# Patient Record
Sex: Female | Born: 1935 | Race: Black or African American | Hispanic: No | Marital: Single | Smoking: Former smoker
Health system: Southern US, Community
[De-identification: ages and names within clinical notes are randomized; demographics above are authoritative.]

## PROBLEM LIST (undated history)

## (undated) DIAGNOSIS — F329 Major depressive disorder, single episode, unspecified: Secondary | ICD-10-CM

## (undated) DIAGNOSIS — Z993 Dependence on wheelchair: Secondary | ICD-10-CM

## (undated) DIAGNOSIS — M810 Age-related osteoporosis without current pathological fracture: Secondary | ICD-10-CM

## (undated) DIAGNOSIS — F209 Schizophrenia, unspecified: Secondary | ICD-10-CM

## (undated) DIAGNOSIS — I639 Cerebral infarction, unspecified: Secondary | ICD-10-CM

## (undated) DIAGNOSIS — N189 Chronic kidney disease, unspecified: Secondary | ICD-10-CM

## (undated) DIAGNOSIS — D649 Anemia, unspecified: Secondary | ICD-10-CM

## (undated) DIAGNOSIS — J449 Chronic obstructive pulmonary disease, unspecified: Secondary | ICD-10-CM

## (undated) DIAGNOSIS — E119 Type 2 diabetes mellitus without complications: Secondary | ICD-10-CM

## (undated) DIAGNOSIS — M4807 Spinal stenosis, lumbosacral region: Secondary | ICD-10-CM

## (undated) DIAGNOSIS — K219 Gastro-esophageal reflux disease without esophagitis: Secondary | ICD-10-CM

## (undated) DIAGNOSIS — I509 Heart failure, unspecified: Secondary | ICD-10-CM

## (undated) DIAGNOSIS — F0390 Unspecified dementia without behavioral disturbance: Secondary | ICD-10-CM

## (undated) DIAGNOSIS — E785 Hyperlipidemia, unspecified: Secondary | ICD-10-CM

## (undated) DIAGNOSIS — F32A Depression, unspecified: Secondary | ICD-10-CM

## (undated) DIAGNOSIS — J019 Acute sinusitis, unspecified: Secondary | ICD-10-CM

## (undated) DIAGNOSIS — Z9981 Dependence on supplemental oxygen: Secondary | ICD-10-CM

## (undated) DIAGNOSIS — H409 Unspecified glaucoma: Secondary | ICD-10-CM

## (undated) DIAGNOSIS — I10 Essential (primary) hypertension: Secondary | ICD-10-CM

## (undated) DIAGNOSIS — M1990 Unspecified osteoarthritis, unspecified site: Secondary | ICD-10-CM

## (undated) DIAGNOSIS — C50919 Malignant neoplasm of unspecified site of unspecified female breast: Secondary | ICD-10-CM

## (undated) DIAGNOSIS — Z978 Presence of other specified devices: Secondary | ICD-10-CM

## (undated) DIAGNOSIS — M47812 Spondylosis without myelopathy or radiculopathy, cervical region: Secondary | ICD-10-CM

## (undated) DIAGNOSIS — Z960 Presence of urogenital implants: Secondary | ICD-10-CM

## (undated) HISTORY — DX: Unspecified osteoarthritis, unspecified site: M19.90

## (undated) HISTORY — DX: Cerebral infarction, unspecified: I63.9

## (undated) HISTORY — DX: Malignant neoplasm of unspecified site of unspecified female breast: C50.919

## (undated) HISTORY — PX: APPENDECTOMY: SHX54

## (undated) HISTORY — PX: UTERINE SUSPENSION: SUR1430

## (undated) HISTORY — PX: CHOLECYSTECTOMY: SHX55

## (undated) HISTORY — PX: BREAST SURGERY: SHX581

## (undated) HISTORY — DX: Schizophrenia, unspecified: F20.9

## (undated) HISTORY — DX: Chronic obstructive pulmonary disease, unspecified: J44.9

## (undated) HISTORY — DX: Anemia, unspecified: D64.9

## (undated) HISTORY — DX: Acute sinusitis, unspecified: J01.90

## (undated) HISTORY — PX: CATARACT EXTRACTION, BILATERAL: SHX1313

## (undated) HISTORY — DX: Depression, unspecified: F32.A

## (undated) HISTORY — DX: Unspecified dementia, unspecified severity, without behavioral disturbance, psychotic disturbance, mood disturbance, and anxiety: F03.90

## (undated) HISTORY — PX: JOINT REPLACEMENT: SHX530

## (undated) HISTORY — DX: Type 2 diabetes mellitus without complications: E11.9

## (undated) HISTORY — DX: Major depressive disorder, single episode, unspecified: F32.9

---

## 1998-03-13 ENCOUNTER — Ambulatory Visit (HOSPITAL_COMMUNITY): Admission: RE | Admit: 1998-03-13 | Discharge: 1998-03-13 | Payer: Self-pay | Admitting: Geriatric Medicine

## 1999-12-26 ENCOUNTER — Encounter: Admission: RE | Admit: 1999-12-26 | Discharge: 1999-12-26 | Payer: Self-pay | Admitting: *Deleted

## 1999-12-26 ENCOUNTER — Encounter: Payer: Self-pay | Admitting: *Deleted

## 2000-03-28 ENCOUNTER — Emergency Department (HOSPITAL_COMMUNITY): Admission: EM | Admit: 2000-03-28 | Discharge: 2000-03-28 | Payer: Self-pay

## 2000-12-17 ENCOUNTER — Encounter: Payer: Self-pay | Admitting: *Deleted

## 2000-12-17 ENCOUNTER — Encounter: Admission: RE | Admit: 2000-12-17 | Discharge: 2000-12-17 | Payer: Self-pay | Admitting: Family Medicine

## 2001-05-23 ENCOUNTER — Inpatient Hospital Stay (HOSPITAL_COMMUNITY): Admission: EM | Admit: 2001-05-23 | Discharge: 2001-05-28 | Payer: Self-pay | Admitting: Emergency Medicine

## 2001-05-23 ENCOUNTER — Emergency Department (HOSPITAL_COMMUNITY): Admission: EM | Admit: 2001-05-23 | Discharge: 2001-05-23 | Payer: Self-pay | Admitting: Emergency Medicine

## 2001-05-23 ENCOUNTER — Encounter: Payer: Self-pay | Admitting: Internal Medicine

## 2001-05-23 ENCOUNTER — Encounter: Payer: Self-pay | Admitting: Emergency Medicine

## 2001-05-24 ENCOUNTER — Encounter: Payer: Self-pay | Admitting: Geriatric Medicine

## 2002-11-20 ENCOUNTER — Encounter: Payer: Self-pay | Admitting: Emergency Medicine

## 2002-11-20 ENCOUNTER — Emergency Department (HOSPITAL_COMMUNITY): Admission: EM | Admit: 2002-11-20 | Discharge: 2002-11-20 | Payer: Self-pay | Admitting: Emergency Medicine

## 2003-03-16 ENCOUNTER — Encounter: Admission: RE | Admit: 2003-03-16 | Discharge: 2003-03-16 | Payer: Self-pay | Admitting: Geriatric Medicine

## 2003-03-16 ENCOUNTER — Encounter: Payer: Self-pay | Admitting: Geriatric Medicine

## 2004-06-27 ENCOUNTER — Other Ambulatory Visit: Admission: RE | Admit: 2004-06-27 | Discharge: 2004-06-27 | Payer: Self-pay | Admitting: Geriatric Medicine

## 2004-07-04 ENCOUNTER — Encounter: Admission: RE | Admit: 2004-07-04 | Discharge: 2004-07-04 | Payer: Self-pay | Admitting: Geriatric Medicine

## 2005-07-24 ENCOUNTER — Encounter: Admission: RE | Admit: 2005-07-24 | Discharge: 2005-07-24 | Payer: Self-pay | Admitting: Geriatric Medicine

## 2005-10-11 ENCOUNTER — Inpatient Hospital Stay (HOSPITAL_COMMUNITY): Admission: EM | Admit: 2005-10-11 | Discharge: 2005-10-15 | Payer: Self-pay | Admitting: Emergency Medicine

## 2005-10-14 ENCOUNTER — Encounter (INDEPENDENT_AMBULATORY_CARE_PROVIDER_SITE_OTHER): Payer: Self-pay | Admitting: *Deleted

## 2005-10-15 ENCOUNTER — Inpatient Hospital Stay (HOSPITAL_COMMUNITY)
Admission: RE | Admit: 2005-10-15 | Discharge: 2005-10-25 | Payer: Self-pay | Admitting: Physical Medicine & Rehabilitation

## 2005-10-15 ENCOUNTER — Ambulatory Visit: Payer: Self-pay | Admitting: Physical Medicine & Rehabilitation

## 2005-11-13 ENCOUNTER — Encounter: Admission: RE | Admit: 2005-11-13 | Discharge: 2005-11-13 | Payer: Self-pay | Admitting: Geriatric Medicine

## 2005-11-14 ENCOUNTER — Ambulatory Visit (HOSPITAL_COMMUNITY): Admission: RE | Admit: 2005-11-14 | Discharge: 2005-11-14 | Payer: Self-pay | Admitting: Interventional Cardiology

## 2005-12-04 ENCOUNTER — Encounter: Admission: RE | Admit: 2005-12-04 | Discharge: 2005-12-04 | Payer: Self-pay | Admitting: Geriatric Medicine

## 2005-12-12 ENCOUNTER — Inpatient Hospital Stay (HOSPITAL_COMMUNITY): Admission: RE | Admit: 2005-12-12 | Discharge: 2005-12-17 | Payer: Self-pay | Admitting: Orthopedic Surgery

## 2005-12-12 ENCOUNTER — Ambulatory Visit: Payer: Self-pay | Admitting: Physical Medicine & Rehabilitation

## 2005-12-17 ENCOUNTER — Ambulatory Visit: Payer: Self-pay | Admitting: Physical Medicine & Rehabilitation

## 2005-12-17 ENCOUNTER — Inpatient Hospital Stay (HOSPITAL_COMMUNITY)
Admission: RE | Admit: 2005-12-17 | Discharge: 2005-12-26 | Payer: Self-pay | Admitting: Physical Medicine & Rehabilitation

## 2006-09-24 ENCOUNTER — Encounter: Admission: RE | Admit: 2006-09-24 | Discharge: 2006-09-24 | Payer: Self-pay | Admitting: Geriatric Medicine

## 2007-01-30 ENCOUNTER — Ambulatory Visit (HOSPITAL_COMMUNITY): Admission: RE | Admit: 2007-01-30 | Discharge: 2007-01-30 | Payer: Self-pay | Admitting: Internal Medicine

## 2007-04-15 ENCOUNTER — Encounter: Admission: RE | Admit: 2007-04-15 | Discharge: 2007-04-15 | Payer: Self-pay | Admitting: Internal Medicine

## 2007-09-06 ENCOUNTER — Emergency Department (HOSPITAL_COMMUNITY): Admission: EM | Admit: 2007-09-06 | Discharge: 2007-09-06 | Payer: Self-pay | Admitting: Emergency Medicine

## 2007-11-04 ENCOUNTER — Encounter: Admission: RE | Admit: 2007-11-04 | Discharge: 2007-11-04 | Payer: Self-pay | Admitting: Geriatric Medicine

## 2008-10-23 ENCOUNTER — Encounter: Admission: RE | Admit: 2008-10-23 | Discharge: 2008-10-23 | Payer: Self-pay | Admitting: Orthopedic Surgery

## 2009-07-05 ENCOUNTER — Encounter: Admission: RE | Admit: 2009-07-05 | Discharge: 2009-07-05 | Payer: Self-pay | Admitting: Geriatric Medicine

## 2010-01-14 ENCOUNTER — Emergency Department (HOSPITAL_COMMUNITY): Admission: EM | Admit: 2010-01-14 | Discharge: 2010-01-14 | Payer: Self-pay | Admitting: Emergency Medicine

## 2010-10-23 ENCOUNTER — Emergency Department (HOSPITAL_COMMUNITY): Payer: Medicare Other

## 2010-10-23 ENCOUNTER — Inpatient Hospital Stay (HOSPITAL_COMMUNITY)
Admission: EM | Admit: 2010-10-23 | Discharge: 2010-10-26 | DRG: 690 | Disposition: A | Discharge status: Skilled Nursing Facility | Payer: Medicare Other | Attending: Family Medicine | Admitting: Family Medicine

## 2010-10-23 DIAGNOSIS — R32 Unspecified urinary incontinence: Secondary | ICD-10-CM | POA: Diagnosis present

## 2010-10-23 DIAGNOSIS — F329 Major depressive disorder, single episode, unspecified: Secondary | ICD-10-CM | POA: Diagnosis present

## 2010-10-23 DIAGNOSIS — I69959 Hemiplegia and hemiparesis following unspecified cerebrovascular disease affecting unspecified side: Secondary | ICD-10-CM

## 2010-10-23 DIAGNOSIS — A498 Other bacterial infections of unspecified site: Secondary | ICD-10-CM | POA: Diagnosis present

## 2010-10-23 DIAGNOSIS — R5381 Other malaise: Secondary | ICD-10-CM | POA: Diagnosis present

## 2010-10-23 DIAGNOSIS — J4489 Other specified chronic obstructive pulmonary disease: Secondary | ICD-10-CM | POA: Diagnosis present

## 2010-10-23 DIAGNOSIS — X58XXXA Exposure to other specified factors, initial encounter: Secondary | ICD-10-CM | POA: Diagnosis present

## 2010-10-23 DIAGNOSIS — F209 Schizophrenia, unspecified: Secondary | ICD-10-CM | POA: Diagnosis present

## 2010-10-23 DIAGNOSIS — S46819A Strain of other muscles, fascia and tendons at shoulder and upper arm level, unspecified arm, initial encounter: Secondary | ICD-10-CM | POA: Diagnosis present

## 2010-10-23 DIAGNOSIS — J449 Chronic obstructive pulmonary disease, unspecified: Secondary | ICD-10-CM | POA: Diagnosis present

## 2010-10-23 DIAGNOSIS — M899 Disorder of bone, unspecified: Secondary | ICD-10-CM | POA: Diagnosis present

## 2010-10-23 DIAGNOSIS — F068 Other specified mental disorders due to known physiological condition: Secondary | ICD-10-CM | POA: Diagnosis present

## 2010-10-23 DIAGNOSIS — M25519 Pain in unspecified shoulder: Secondary | ICD-10-CM | POA: Diagnosis present

## 2010-10-23 DIAGNOSIS — M1990 Unspecified osteoarthritis, unspecified site: Secondary | ICD-10-CM | POA: Diagnosis present

## 2010-10-23 DIAGNOSIS — Z66 Do not resuscitate: Secondary | ICD-10-CM | POA: Diagnosis present

## 2010-10-23 DIAGNOSIS — C50919 Malignant neoplasm of unspecified site of unspecified female breast: Secondary | ICD-10-CM | POA: Diagnosis present

## 2010-10-23 DIAGNOSIS — M47812 Spondylosis without myelopathy or radiculopathy, cervical region: Secondary | ICD-10-CM | POA: Diagnosis present

## 2010-10-23 DIAGNOSIS — N390 Urinary tract infection, site not specified: Principal | ICD-10-CM | POA: Diagnosis present

## 2010-10-23 DIAGNOSIS — M542 Cervicalgia: Secondary | ICD-10-CM | POA: Diagnosis present

## 2010-10-23 DIAGNOSIS — E785 Hyperlipidemia, unspecified: Secondary | ICD-10-CM | POA: Diagnosis present

## 2010-10-23 DIAGNOSIS — F3289 Other specified depressive episodes: Secondary | ICD-10-CM | POA: Diagnosis present

## 2010-10-23 LAB — BASIC METABOLIC PANEL
BUN: 10 mg/dL (ref 6–23)
CO2: 26 mEq/L (ref 19–32)
GFR calc non Af Amer: 56 mL/min — ABNORMAL LOW (ref >60)
Glucose, Bld: 133 mg/dL — ABNORMAL HIGH (ref 70–99)
Potassium: 4 mEq/L (ref 3.5–5.1)

## 2010-10-23 LAB — CBC
HCT: 41.2 % (ref 36.0–46.0)
Hemoglobin: 13.3 g/dL (ref 12.0–15.0)
MCH: 29.2 pg (ref 26.0–34.0)
MCHC: 32.3 g/dL (ref 30.0–36.0)
MCV: 90.5 fL (ref 78.0–100.0)
Platelets: 183 K/uL (ref 150–400)
RBC: 4.55 MIL/uL (ref 3.87–5.11)
RDW: 15 % (ref 11.5–15.5)
WBC: 6.7 K/uL (ref 4.0–10.5)

## 2010-10-23 LAB — BASIC METABOLIC PANEL WITH GFR
Calcium: 10.2 mg/dL (ref 8.4–10.5)
Chloride: 105 meq/L (ref 96–112)
Creatinine, Ser: 0.96 mg/dL (ref 0.4–1.2)
GFR calc Af Amer: >60 mL/min (ref >60)
Sodium: 139 meq/L (ref 135–145)

## 2010-10-23 LAB — URINALYSIS, ROUTINE W REFLEX MICROSCOPIC
Bilirubin Urine: NEGATIVE
Ketones, ur: NEGATIVE mg/dL
Nitrite: NEGATIVE
Protein, ur: NEGATIVE mg/dL
Specific Gravity, Urine: 1.006 (ref 1.005–1.030)
Urine Glucose, Fasting: NEGATIVE mg/dL
Urobilinogen, UA: 0.2 mg/dL (ref 0.0–1.0)
pH: 6 (ref 5.0–8.0)

## 2010-10-23 LAB — DIFFERENTIAL
Basophils Absolute: 0 10*3/uL (ref 0.0–0.1)
Basophils Relative: 0 % (ref 0–1)
Eosinophils Absolute: 0 10*3/uL (ref 0.0–0.7)
Eosinophils Relative: 0 % (ref 0–5)
Lymphocytes Relative: 21 % (ref 12–46)
Lymphs Abs: 1.4 10*3/uL (ref 0.7–4.0)
Monocytes Absolute: 0.5 10*3/uL (ref 0.1–1.0)
Monocytes Relative: 7 % (ref 3–12)
Neutro Abs: 4.8 K/uL (ref 1.7–7.7)
Neutrophils Relative %: 71 % (ref 43–77)

## 2010-10-23 LAB — GLUCOSE, CAPILLARY: Glucose-Capillary: 121 mg/dL — ABNORMAL HIGH (ref 70–99)

## 2010-10-23 LAB — URINE MICROSCOPIC-ADD ON

## 2010-10-23 LAB — POCT CARDIAC MARKERS
CKMB, poc: 2.6 ng/mL (ref 1.0–8.0)
Myoglobin, poc: 118 ng/mL (ref 12–200)
Troponin i, poc: <0.05 ng/mL (ref 0.00–0.09)

## 2010-10-23 LAB — MRSA PCR SCREENING: MRSA by PCR: NEGATIVE

## 2010-10-24 ENCOUNTER — Other Ambulatory Visit (HOSPITAL_COMMUNITY): Payer: Medicare Other

## 2010-10-24 ENCOUNTER — Inpatient Hospital Stay (HOSPITAL_COMMUNITY): Payer: Medicare Other

## 2010-10-24 LAB — GLUCOSE, CAPILLARY
Glucose-Capillary: 134 mg/dL — ABNORMAL HIGH (ref 70–99)
Glucose-Capillary: 141 mg/dL — ABNORMAL HIGH (ref 70–99)
Glucose-Capillary: 117 mg/dL — ABNORMAL HIGH (ref 70–99)

## 2010-10-24 LAB — CBC
HCT: 40 % (ref 36.0–46.0)
Hemoglobin: 12.8 g/dL (ref 12.0–15.0)
MCH: 28.9 pg (ref 26.0–34.0)
MCHC: 32 g/dL (ref 30.0–36.0)
MCV: 90.3 fL (ref 78.0–100.0)
Platelets: 162 K/uL (ref 150–400)
RBC: 4.43 MIL/uL (ref 3.87–5.11)
RDW: 15 % (ref 11.5–15.5)
WBC: 6.4 K/uL (ref 4.0–10.5)

## 2010-10-24 LAB — HEMOGLOBIN A1C: Mean Plasma Glucose: 126 mg/dL — ABNORMAL HIGH (ref <117)

## 2010-10-24 LAB — BASIC METABOLIC PANEL WITH GFR
BUN: 9 mg/dL (ref 6–23)
CO2: 28 meq/L (ref 19–32)
Chloride: 105 meq/L (ref 96–112)
Glucose, Bld: 104 mg/dL — ABNORMAL HIGH (ref 70–99)
Potassium: 4 meq/L (ref 3.5–5.1)

## 2010-10-24 LAB — BASIC METABOLIC PANEL
Calcium: 9.7 mg/dL (ref 8.4–10.5)
Creatinine, Ser: 0.9 mg/dL (ref 0.4–1.2)
GFR calc Af Amer: >60 mL/min (ref >60)
GFR calc non Af Amer: 60 mL/min — ABNORMAL LOW (ref >60)
Sodium: 141 mEq/L (ref 135–145)

## 2010-10-25 ENCOUNTER — Other Ambulatory Visit (HOSPITAL_COMMUNITY): Payer: Medicare Other

## 2010-10-25 LAB — URINE CULTURE
Colony Count: >=100000
Culture  Setup Time: 201202072100

## 2010-10-25 LAB — CBC
MCH: 29.1 pg (ref 26.0–34.0)
MCHC: 32.4 g/dL (ref 30.0–36.0)
Platelets: 163 10*3/uL (ref 150–400)
RBC: 4.36 MIL/uL (ref 3.87–5.11)

## 2010-10-25 LAB — GLUCOSE, CAPILLARY: Glucose-Capillary: 104 mg/dL — ABNORMAL HIGH (ref 70–99)

## 2010-10-26 LAB — GLUCOSE, CAPILLARY
Glucose-Capillary: 130 mg/dL — ABNORMAL HIGH (ref 70–99)
Glucose-Capillary: 110 mg/dL — ABNORMAL HIGH (ref 70–99)

## 2010-10-29 LAB — GLUCOSE, CAPILLARY: Glucose-Capillary: 165 mg/dL — ABNORMAL HIGH (ref 70–99)

## 2010-11-02 NOTE — Discharge Summary (Signed)
  NAMEKATERIN, NEGRETE                   ACCOUNT NO.:  [id number]  MEDICAL RECORD NO.:  [id number]           PATIENT TYPE:  I  LOCATION:  1538                         FACILITY:  [facility] [place]  PHYSICIAN:  Pleas Koch, MD        DATE OF BIRTH:  05-12-1926  DATE OF ADMISSION:  10/23/2010 DATE OF DISCHARGE:                              DISCHARGE SUMMARY   ADDENDUM  The patient will be discharged home on indwelling Foley.  Please note that this has not been a longstanding Foley and that she has had this for a short period of time.  The patient had a trial of voiding but failed this on day prior to discharge, and this may likely help her until she is able to ambulate well with physical and occupational therapy.  Please re-assess once at nursing home for ability to void on her own and you can clamp this as an outpatient.          ______________________________ Pleas Koch, MD     JS/MEDQ  D:  10/26/2010  T:  10/26/2010  Job:  [id number]  Electronically Signed by Pleas Koch MD on 11/02/2010 03:05:07 PM

## 2010-11-02 NOTE — Discharge Summary (Signed)
[name], [name]                   ACCOUNT NO.:  [id number]  MEDICAL RECORD NO.:  [id number]           PATIENT TYPE:  I  LOCATION:  1538                         FACILITY:  [facility]  PHYSICIAN:  Pleas Koch, MD        DATE OF BIRTH:  31-Jul-1926  DATE OF ADMISSION:  10/23/2010 DATE OF DISCHARGE:                              DISCHARGE SUMMARY   DISCHARGE DIAGNOSES: 1. Escherichia coli urosepsis. 2. Right-sided neck pain. 3. Prior history of stroke. 4. Hyperlipidemia. 5. Breast cancer. 6. Schizophrenia. 7. Dementia. 8. Depression. 9. Anemia.  MEDICATIONS:  Medications will be reconciled on discharge.  PERTINENT RADIOLOGICAL FINDINGS:  C-spine x-rays on May 24, 2011, showed: 1. No acute findings. 2. Advanced degenerative cervical spondylosis and mild     dextroscoliosis, multilevel bilateral neural foraminal stenosis,     left side greater than right, and osteopenia.  Shoulder x-ray showed: 1. No acute findings. 2. Moderate acromioclavicular and glenohumeral degenerative joint     disease. 3. Osteopenia.  MRI of the right shoulder done on 05/25/2011 showed: 1. Large full thickness retracted supraspinatus tendon tear. 2. Probable inflammatory arthropathy involving right shoulder. 3. Degenerative labral changes and possible superior labral tear.  MRI of the C-spine showed: Stenosis at C4, C5, C6 level with cord compression, altered signal intensity within the cord, which may represent myelomalacia or edema, marked bilateral foraminal narrowing at both levels and questioned 1.4 cm right lobe thyroid lesion.  The rest of the cervical spine has mild- to-moderate stenosis and some foraminal narrowing.  HISTORY:  The patient is an 77 year old female with history of stroke and end-stage osteoarthritis, who awoke on the morning of admission with inability to turn head to right secondary to severe pain.  This was not relieved with Vicodin.  The daughter complained she has had  increased weakness and decreased function and was able to walk up the steps with assistance and across the floor; however, was not able to transfer from the bed to wheelchair without difficulty.  The patient fell in the shower in the spring and was caught by her right arm and since that time has had intermittent pain.  She has pain running down the right side of her neck to her elbow and the pain  is burning in nature and unrelenting.  The patient has had some increasing incontinence of urine, and as she has hemiplegia secondary to a stroke, and mainly is dependent on her right hand and goes up and down from the bathroom by assisting with the right hand and now is limited because of this.  Please see admission dictation for further details.  HOSPITAL COURSE: 1. UTI.  The patient was noted to have many bacteria in the urine and     subsequently was placed on Rocephin, which she will likely     continue.  She was placed on nitrofurantoin and on ceftriaxone     initially, and urine culture came back showing above 100,000 CFU of     E. coli, sensitive to Bactrim, tobramycin, nitrofurantoin,     levofloxacin, gentamicin, ciprofloxacin, ceftriaxone and cefazolin;  and resistant to ampicillin.  As such, I will discharge the patient     home on Macrobid b.i.d.  The patient will need probably about 3-4     more days of the same. 2. Shoulder complex pain.  Orthopedics was consulted.  Dr. Madelon Lips had     seen the patient and he noted decreased range of motion to the     left, greater than right, and decreased extension with good     flexion.  MRI did show a significant tear of the supraspinatus as     noted above as well as probable superior labral tear.  It was     recommended that the patient follow-up as an outpatient for further     therapy within 2-3 weeks.  The patient underwent steroid injection     in the left shoulder complex and received much improvement with 40     mg of  Depo-Medrol and 5% Marcaine, and if the patient does have     recurring right shoulder pain, Dr. Madelon Lips can be consulted.  The     beeper number for him is [phone]. 3. Prior history of CVA.  The patient was stable within     hospitalization and stayed on aspirin 325 mg. 4. The patient was kept on sliding scale coverage while in hospital     and blood sugars were relatively well controlled while here. 5. Dementia.  The patient was on donepezil and will continue the same.     It is noted that she is at [facility];     however, physical therapy evaluated the patient and thought that     she would likely be a good candidate for staff to rehabilitate her     and get her strength back, and we concur with this. 6. History of breast cancer.  This likely will need to be followed up     with Oncology as an outpatient. 7. Anemia, stable. 8. Blood pressure moderately well controlled while in hospital.     Continue on medications prior to admission.  Medications will be reconciled on discharge.  Vitals on day of review were temperature 97.9, pulse 106, respirations 20, systolic blood pressure 131-143/75-76, and she is saturating 94% on room air.          ______________________________ Pleas Koch, MD     JS/MEDQ  D:  10/25/2010  T:  10/25/2010  Job:  [id number]  cc:   Hal T. Stoneking, M.D. Fax: [phone]  Dr. Raechel Ache, M.D. Fax: [phone]  Electronically Signed by Pleas Koch MD on 11/02/2010 03:05:05 PM

## 2010-11-02 NOTE — Discharge Summary (Signed)
  [name], [name]                   ACCOUNT NO.:  [id number]  MEDICAL RECORD NO.:  [id number]           PATIENT TYPE:  I  LOCATION:  1538                         FACILITY:  [facility]  PHYSICIAN:  Pleas Koch, MD        DATE OF BIRTH:  1926-01-11  DATE OF ADMISSION:  10/23/2010 DATE OF DISCHARGE:                              DISCHARGE SUMMARY   ADDENDUM: On day of discharge, vital signs were temperature 98.5, pulse 86, respirations 18, blood pressure 130-137/75-82 and 94% room air.  The patient was still having some shoulder pain and stated that she has not really had a stool since she came in.  She denied any fever, any nausea, any vomiting.  The patient will be discharged today to nursing facility.  She will likely need to continue on 4 more days of nitrofurantoin b.i.d. and follow up as an outpatient with Dr. Madelon Lips of Orthopedics.  The patient will need physical therapy, occupational therapy at skilled nursing facility as well.  MEDICATIONS ON DISCHARGE: 1. Tylenol Extra Strength 500 mg 1 tablet b.i.d. 2. Restasis 0.05% 1 drop b.i.d., both eyes. 3. Patanol 0.1%, 1 drop b.i.d., both eyes. 4. Gabapentin 100 mg 1 capsule q.h.s. 5. Doxepin 1 capsule q.h.s. 6. Clotrimazole and betamethasone 1%/0.05% cream topically 1     application daily p.r.n. 7. Fluphenazine decanoate 25 mg 0.3 mL q. monthly IM. 8. Metformin 500 mg 1 tablet q.a.m. 9. Hydrocodone APAP 5/325, 0.5-1 tablet q. 6 h. p.r.n. for pain and a     prescription was given for this. 10.Aspirin enteric coated 325, 1 tablet q.a.m. 11.Donepezil 10 mg 1 tablet q.h.s. 12.Melatonin 3 mg 1 tablet q.h.s. 13.Advair Diskus 100/50, 1 puff b.i.d. 14.Albuterol 0.83 mg/mL nebulization 1 neb q.a.m., and she apparently     takes this nebulization 30 minutes before exercise.  I have discontinued Prilosec as this has been shown to cause pneumonia in select patient populations including the elderly.  The patient will also go home on  nitrofurantoin 100 mg 1 tablet b.i.d. until all medications are gone.  I have also discharged her on low-dose lactulose 15 mL b.i.d. until stool/diarrhea and this can be discontinued at discretion of nursing home physician.          ______________________________ Pleas Koch, MD     JS/MEDQ  D:  10/26/2010  T:  10/26/2010  Job:  [id number]  Electronically Signed by Pleas Koch MD on 11/02/2010 03:05:10 PM

## 2010-12-04 LAB — URINALYSIS, ROUTINE W REFLEX MICROSCOPIC
Bilirubin Urine: NEGATIVE
Ketones, ur: NEGATIVE mg/dL
Nitrite: NEGATIVE
Protein, ur: NEGATIVE mg/dL
Urobilinogen, UA: 0.2 mg/dL (ref 0.0–1.0)

## 2010-12-04 NOTE — H&P (Signed)
[name], [name]                   ACCOUNT NO.:  [id number]  MEDICAL RECORD NO.:  [id number]           PATIENT TYPE:  E  LOCATION:  WLED                         FACILITY:  [facility]  PHYSICIAN:  Homero Fellers, MD   DATE OF BIRTH:  [birth year]  DATE OF ADMISSION:  10/23/2010 DATE OF DISCHARGE:                             HISTORY & PHYSICAL   PRIMARY CARE PHYSICIAN:  Hal T. Stoneking, MD  ORTHOPEDIC SURGEON:  Dr. Eulah Pont of [facility].  CHIEF COMPLAINT:  Right neck and shoulder pain.  HISTORY OF PRESENT ILLNESS:  This is an 77 year old female with a history of stroke and end-stage osteoarthritis who awoke this morning, unable to turn her head to the right secondary to severe pain.  The pain was not relieved with Vicodin.  Further her daughter complains that the patient has had increased weakness and decreased function.  Just a few months ago, she was able to walk up the steps with assistance and across the floor of the house, now she is unable to even transfer from bed to wheelchair without great difficulty.  Per the daughter, the patient fell in the shower in the spring and was caught by her right arm, since that time she has had intermittent pain.  Today, the patient describes the pain is traveling from the right side of her neck down to approximately her elbow, the pain is burning in nature and is unrelenting.  PAST MEDICAL HISTORY: 1. Diabetes mellitus. 2. COPD. 3. Stroke with residual left hemiparesis. 4. Hyperlipidemia. 5. Breast cancer. 6. Schizophrenia. 7. Anemia. 8. Dementia. 9. Depression. 10.Syncope.  PAST SURGICAL HISTORY:  A left total knee replacement. Appendectomy. Uterine suspension.  REVIEW OF SYSTEMS:  Per HPI, otherwise CONSTITUTIONAL:  The patient denies any fever, night sweats, insomnia or anorexia.  HEAD:  She denies any headache, changes in her vision, pain in her ears, eyes, nose, or throat.  CHEST:  She denies any chest pain,  palpitations, orthopnea or PND.  RESPIRATORY:  She denies any cough, hemoptysis, or difficulty breathing.  GASTROINTESTINAL:  She denies any vomiting, changes to her bowel habits or abdominal pain.  GENITOURINARY:  She denies any hematuria or dysuria, but she does complain of acute onset of incontinence this morning, which is atypical for this patient. EXTREMITIES:  She denies any swelling in all 4 extremities; however, in her right upper extremity, she has acute onset of burning pain as described in the HPI.  SKIN:  She denies any rashes, bruises, or lesions.  NEUROLOGIC:  She denies any syncope or seizures.  PSYCHIATRIC: She denies any depression or suicidal ideations.  SOCIAL HISTORY:  She lives in an assisted living facility.  She is a do not resuscitate.  She denies any tobacco, alcohol, or drugs.  Her daughter appears to be her primary caretaker.  FAMILY HISTORY:  Positive for coronary artery disease and stroke.  PHYSICAL EXAMINATION:  GENERAL:  This is a pleasant, morbidly obese Caucasian female lying in no apparent distress in the [facility] ED. VITAL SIGNS:  Temperature 97.8, pulse 95, respirations 22, blood pressure 132/67. HEENT:  Head is  atraumatic, normocephalic.  Eyes are anicteric with pupils that are pinpoint at this point in time.  Nose shows no nasal discharge or exterior lesions.  Mouth has moist mucous membranes.  She has got both upper and lower dentures. NECK:  Supple with midline trachea.  No JVD.  No lymphadenopathy.  I do a do not auscultate any carotid bruits. CHEST:  Demonstrates no accessory muscle use.  She does not have any wheezes or crackles to my auscultation. HEART:  Regular rate and rhythm without obvious murmurs, rubs, or gallops. ABDOMEN: Obese, nontender, nondistended with good bowel sounds.  I do not appreciate any masses. EXTREMITIES:  Lower extremities show no clubbing, cyanosis, or edema. She has 5/5 strength in each right upper extremity.   She has difficulty with abduction and flexion.  She has no problem with extension or adduction of her right shoulder.  Rapid finger movements on the right are reduced.  She is unable to do them on the left secondary to her residual hemiparesis. SKIN:  No rashes, bruises, or lesions. NEUROLOGIC:  Cranial nerves II through XII are grossly intact.  The patient has mild asymmetry of her face from her previous stroke.  Her speech is slightly slurred.  Her tongue deviates to the right.  All of this is chronic in nature. PSYCHIATRIC:  Patient is alert and oriented.  Her demeanor is pleasant, cooperative.  Her grooming is good.  LABORATORY DATA:  White count 6.7, hemoglobin 13.3, hematocrit 41.2, platelets 183.  Sodium 139, potassium 4.0, chloride 105, bicarb 26, BUN 10, creatinine 0.96, glucose 133.  Point care cardiac markers x1 are negative.  Urine shows large leukocyte esterase and 21 to 50 white blood cells.  RADIOLOGICAL EXAMS: 1. She has an MRI of her cervical and thoracic spine and right     shoulder pending.  X-ray of her right shoulder shows no acute     findings. 2. Moderate acromioclavicular and glenohumeral DJD. 3. Osteopenia. 4. She also had an x-ray of her cervical C-spine, no acute findings. 5. Advanced degenerative cervical spondylosis and mild     dextroscoliosis, multilevel bilateral neural foraminal stenosis     left side greater than right. 6. Osteopenia.  ASSESSMENT:  Dr. Homero Fellers has seen and examined the patient, collected history, reviewed her chart and spoken at length with the patient, her daughter, and the PA about case. His impression is 1. This is an 77 year old female with increased weakness, who presents     to the [facility] ED for right-sided neck and shoulder pain. 2. She has urinary tract infection with incontinence. 3. She has increased weakness and debilitation.  PLAN: 1. We will admit her.  With regard to urinary tract infection, we  will     culture her urine and start Rocephin. 2. For neck and shoulder pain, we will obtain the MRI.  We will talk     Dr. Madelon Lips and Eulah Pont, who have agreed to see her. 3. For her weakness and debilitation, we have consulted PT/OT and     social work for their evaluation as the patient may need a higher     level of care on an ongoing basis. 4. This patient is a DNR/DNI.     Stephani Police, PA   ______________________________ Homero Fellers, MD    MLY/MEDQ  D:  10/23/2010  T:  10/23/2010  Job:  [id number]  cc:   Hal T. Stoneking, M.D. Fax: [phone]  Dr. Eulah Pont  Electronically Signed by Algis Downs  PA on 10/29/2010 01:48:05 PM Electronically Signed by Homero Fellers  on 12/04/2010 02:06:23 AM

## 2011-02-01 NOTE — H&P (Signed)
[name], [name]                   ACCOUNT NO.:  [id number]   MEDICAL RECORD NO.:  [id number]          PATIENT TYPE:  IPS   LOCATION:  4007                         FACILITY:  [facility]   PHYSICIAN:  Ranelle Oyster, M.D.DATE OF BIRTH:  [birth year]   DATE OF ADMISSION:  12/17/2005  DATE OF DISCHARGE:                                HISTORY & PHYSICAL   HISTORY OF PRESENT ILLNESS:  This is a 77 year old female well-known to the  rehab service from a prior admission with gait instability and knee pain,  who was admitted on December 12, 2005, for progressive knee pain and  degenerative changes.  The patient underwent a left total knee replacement  on March 29 by Dr. Eulah Pont.  She has been weightbearing as tolerated  postoperatively.  She is using CPM for range of motion.  She was placed on  heparin and Coumadin for DVT prophylaxis.  The patient has some intolerance  to pain medications, leading to some confusion and nausea.  The patient has  had postoperative anemia requiring transfusions of packed red blood cells.  The patient has had urinary symptoms and there was some question of UTI.  The patient was on Detrol in the past for urinary incontinence and  overactive bladder.  The patient continues to need assistance with mobility  and self-care.  She intends to return to assisted living facility where she  was staying prior to arrival.  As of April 2, the patient still was moderate  assist for basic transfers and mobility. Pain has been treated generally  with Tylenol as of late with fair results.   REVIEW OF SYSTEMS:  Positive for constipation.  Has had some dyspnea on  exertion and stress incontinence.  She has had occasional headache and  dizziness.  Full review is in the written H&P.   PAST MEDICAL HISTORY:  1.  Diabetes.  2.  COPD.  3.  Right-sided CVA in 1997 with left hemiparesis, primarily in the left      upper extremity.  4.  History of appendectomy.  5.  Uterine suspension.  6.   Dyslipidemia.  7.  Breast cancer.  8.  Schizophrenia.  9.  Anemia.  10. Dementia.  11. Depression.  12. Syncopal episodes.   FAMILY HISTORY:  Positive for coronary artery disease and stroke.   SOCIAL HISTORY:  The patient lives at [facility] assisted living  facility.  Needs assistance with medications and meals, otherwise is  independent in the room.  Her daughter helps her somewhat with some of her  self-care items.   MEDICATIONS PRIOR TO ARRIVAL:  Coumadin, Vytorin, Actos, doxepin, Aricept,  Prolixin, and Advair.   ALLERGIES:  None.   PHYSICAL EXAMINATION:  VITAL SIGNS:  Blood pressure is 154/84, pulse is 95,  respiratory rate 18, temperature is 99.6.  Most recent CBG 108.  GENERAL:  The patient is pleasant, in no acute distress.  The patient is  alert and oriented x3.  Affect is generally bright and appropriate.  HEENT:  Head and neck exam was unremarkable.  The patient was edentulous.  Oral mucosa was pink and moist.  NECK:  Supple without JVD or lymphadenopathy.  CHEST:  Clear to auscultation bilaterally without wheezes, rales or rhonchi.  CARDIAC:  Regular rate and rhythm without murmur, rub, or gallop.  ABDOMEN:  Soft, nontender, bowel sounds generally positive.  EXTREMITIES:  No clubbing or cyanosis, 1+ edema, left greater than right.  The left knee was examined.  It was clean, dry, and intact without drainage.  The area was reportedly tender.  She was able to actively flex the knee  approximately 30 degrees today.  She had fair ankle motion and abduction and  adduction of the left hip.  NEUROLOGIC:  Left upper extremity strength is 2 to 2+/5 throughout.  Right  upper extremity and right lower extremity strength is 5/5.  Remainder of the  neurologic was positive for a left central seventh.  She had some  dysarthria.  She had no obvious visual or spatial deficits.  Cognitively she  had fair awareness and insight into her situation and surroundings.  Sensory   function was intact.  She was a bit hyperreflexic on the left side at 3+.  On the right side, reflexes were 1+ to 2+.   ASSESSMENT AND PLAN:  1.  Functional deficit secondary to left total knee replacement.  Begin      comprehensive inpatient rehab with PT to assess for mobility and lower      extremity strength.  OT will assess for self-care and upper extremity      use.  Twenty-four hour nursing will monitor bowel and bladder, skin and      medication issues.  The case manager/social worker will assess her      psychosocial needs.  Estimated length of stay is seven to 10 days.      Prognosis is fair.  Goal is supervision to modified independent.  2.  History of right cerebrovascular accident.  3.  History of breast cancer.  4.  Deep vein thrombosis prophylaxis with Coumadin.  5.  Hypertension.  Monitor the patient on lisinopril.  6.  Anemia.  Status post transfusion.  The patient is to resume folic acid      and ferrous gluconate daily.  7.  Diabetes.  Continue Actos and sliding scale insulin.  Monitor CBGs      daily.  8.  Lipids.  On Vytorin.  9.  Sleep/psychiatric.  Sinequan 50 mg q.h.s.  The patient also is on      Aricept 10 mg q.h.s.  10. Bladder.  Check PVRs.  Observe voiding pattern.  Will check UA/C&S if      this has not been done already.      Ranelle Oyster, M.D.  Electronically Signed     ZTS/MEDQ  D:  12/17/2005  T:  12/17/2005  Job:  [id number]

## 2011-02-01 NOTE — H&P (Signed)
NAMEMARNEY, TRELOAR                   ACCOUNT NO.:  [id number]   MEDICAL RECORD NO.:  [id number]          PATIENT TYPE:  EMS   LOCATION:  MAJO                         FACILITY:  [facility]   PHYSICIAN:  Marlan Palau, M.D.  DATE OF BIRTH:  1925-10-23   DATE OF ADMISSION:  10/11/2005  DATE OF DISCHARGE:                                HISTORY & PHYSICAL   HISTORY OF PRESENT ILLNESS:  [name] is a 77 year old right-handed black  female born 16-Jan-1926 with a history of hypertension and borderline  diabetes.  Patient has a history of prior cerebrovascular disease and a left  hemiparesis that is chronic in nature.  Patient has been residing in  extended care facility.  Patient claims that around 6:30 this morning she  got up to go to the bathroom.  Patient had no warning, but suddenly felt as  if her vision was dimming out, slipped to the floor, laid flat on the floor  until help arrived.  Patient states that since that time she has not been  able to walk well, has had some slurring of speech, and feels that both legs  below the knee are somewhat numb.  Patient did not hurt herself with the  fall.  Patient claims she did not black out.  Patient was brought to the  [facility] Emergency Room for further evaluation.  Patient has had a CT scan  of the head and this shows evidence of extensive small vessel disease but no  acute changes were seen.  Neurology was asked to see this patient for  further evaluation.   PAST MEDICAL HISTORY:  1.  New onset of gait disorder, slurred speech, rule out stroke.  2.  Small vessel disease by CT.  3.  Obesity.  4.  Diabetes.  5.  Hypertension.  6.  Breast cancer.  7.  Appendectomy.  8.  Uterine resuspension procedure.  9.  Mild OBS.  10. History of tonsillectomy.  11. Depression.  12. History of pneumonia in the past.   MEDICATIONS PRIOR TO ADMISSION:  1.  Doxepin 50 mg a day.  2.  Melatonin 3 mg at night.  3.  Oxytrol patch two patches a week.  4.  Ecotrin 325 mg daily.  5.  Hydrochlorothiazide 12.5 mg daily.  6.  Aricept 10 mg a day.  7.  Actos 15 mg a day.  8.  Vytorin 10/80 mg tablets one daily.  9.  Lisinopril 10 mg daily.   Patient states no known allergies.  Does not smoke or drink.   SOCIAL HISTORY:  This patient is single.  Lives in an extended care  facility.  Followed by Dr. Merlene Laughter.  Patient has two daughters who are  alive and well.  Patient is retired.   FAMILY HISTORY:  Mother died with heart murmur, cardiac disease.  Father  died with an MI.  Patient has three sisters, four brothers.  One sibling  died from cancer.  One died following a seizure.  One died following an MI.  One died from old age.  REVIEW OF SYSTEMS:  Notable for no recent fevers, chills.  Patient denies  headache, visual changes.  Other than what is mentioned above denies  problems swallowing, choking.  Does note some dizziness.  Denies shortness  of breath.  Denies chest pain, palpitations of the heart.  Denies abdominal  pain, nausea, vomiting.  Feels weak all over.  Has numbness below the knees  bilaterally and has dimming of vision, near syncope today.   PHYSICAL EXAMINATION:  VITAL SIGNS:  Blood pressure 116/66, heart rate 80,  respiratory rate 18, temperature afebrile.  GENERAL:  This patient is a moderately obese black female who is alert and  cooperative at the time of examination.  HEENT:  Head is atraumatic.  Eyes:  Pupils are equal, round, and reactive to  light.  Disks are flat bilaterally.  NECK:  Supple.  No carotid bruits noted.  RESPIRATORY:  Clear.  CARDIOVASCULAR:  Regular rate and rhythm.  No obvious murmurs or rubs noted.  EXTREMITIES:  Without significant edema.  ABDOMEN:  Positive bowel sounds.  No organomegaly or tenderness noted.  NEUROLOGIC:  Cranial nerves as above.  Facial symmetry is not present.  Patient has a depression of the left nasal labial fold.  Extraocular  movements again are full.  Visual  fields are full.  Speech is slightly  dysarthric.  No aphasia is noted.  Patient has some 4/5 strength in the left  upper extremity, normal strength on the right.  Patient has trace weakness  in the left leg, normal on the right.  Patient has some ataxia with finger  nose finger on the left, normal on the right.  Normal toe to finger  bilaterally.  Patient has a good pin prick, soft touch, vibratory sensation  throughout.  Patient does have some extinction on the right to double  simultaneous stimulation.  Deep tendon reflexes reveal a relative left-sided  hyperreflexia.  Toes neutral, however, bilaterally.  Patient is not  ambulated.  Patient is able to perform heel to shin bilaterally.   LABORATORIES:  Sodium 138, potassium 3.9, chloride 107, BUN 15, glucose 101.  Hemoglobin 13.3, hematocrit 39, white count 5.5, platelets 153, MCV 89.2.  Urinalysis reveals specific gravity of 1.016, pH 6.5, otherwise  unremarkable.  CT of the head is as above.  EKG reveals normal sinus rhythm,  nonspecific T-wave abnormalities, heart rate of 79.  Chest x-ray is pending.   IMPRESSION:  1.  History of onset of near syncope.  2.  Slurred speech, gait disorder, rule out stroke.  3.  History of small vessel disease, left hemiparesis prior to admission.  4.  Diabetes.  5.  Hypertension.   This patient clearly has risk factors for stroke.  Patient has sudden change  in her functional level, some dysarthria, difficulty with walking.  Will  need to evaluate this patient for a stroke event.   PLAN:  1.  Admission to [facility] stroke service.  2.  MRI scan of the brain.  3.  MR angiogram.  4.  2-D echocardiogram.  5.  Carotid Doppler study.  6.  Physical and occupational therapy.  7.  Bedside swallow evaluation.  8.  Aspirin therapy and Lovenox.  NIH stroke scale today was 5.  Patient was      not felt to be a TPA candidate due to the time of onset of deficit is     not known definitely.   Patient also has minimal new deficits on the  clinical examination.      Marlan Palau, M.D.  Electronically Signed     CKW/MEDQ  D:  10/11/2005  T:  10/11/2005  Job:  [id number]   cc:   Hal T. Stoneking, M.D.  Fax: [phone]

## 2011-02-01 NOTE — Discharge Summary (Signed)
[name], [name]                   ACCOUNT NO.:  [id number]   MEDICAL RECORD NO.:  [id number]          PATIENT TYPE:  INP   LOCATION:  3016                         FACILITY:  [facility]   PHYSICIAN:  [name], M.D.  DATE OF BIRTH:  Feb 17, 1926   DATE OF ADMISSION:  10/11/2005  DATE OF DISCHARGE:  10/15/2005                                 DISCHARGE SUMMARY   ADMISSION DIAGNOSIS:  1.  New onset of slurred speech, gait disturbance, near syncope, rule out      cerebrovascular infarction.  2.  Hypertension.  3.  Diabetes.  4.  Organic brain syndrome.   DISCHARGE DIAGNOSIS:  1.  Sudden onset of slurred speech, gait disorder, questionable      cerebrovascular infarction.  2.  Hypertension.  3.  Febrile illness, etiology unclear.  4.  Diabetes.  5.  Organic brain syndrome.  6.  Near syncopal events prior to admission.   PROCEDURES PERFORMED:  1.  MRI of the brain.  2.  MR angiogram.  3.  2D echocardiogram.  4.  Carotid Doppler study.   HISTORY OF PRESENT ILLNESS:  [name] is a 77 year old black female born  06-12-1926, with a history of hypertension and prior cerebrovascular  disease.  The patient has sustained a stroke in the past leaving her with  some left hemiparesis.  The patient has been brought to the [facility]  Emergency Room for evaluation of an event of near syncope that occurred on  the morning of admission.  The patient had gotten up out of bed and went to  the bathroom and noted that she started feeling light headed, dimming of  vision, loss of vision, but never lost consciousness.  The patient slid to  the floor and laid on the floor until someone came to help her.  Since that  time, the patient has felt that she could not walk well and had some  slurring of speech.  The patient had some numbness below the knees on both  legs.  The patient had a CT scan of the head that showed evidence of  extensive small vessel disease but no acute changes were noted.   Neurology  was asked to see the patient for further evaluation.   PAST MEDICAL HISTORY:  Significant for:  1.  History of gait disorder, slurred speech, rule out cerebrovascular      infarction.  2.  Small vessel disease by CT.  3.  Obesity.  4.  Diabetes.  5.  Hypertension.  6.  History of breast cancer.  7.  Appendectomy.  8.  Uterine re-suspension procedure.  9.  Mild organic brain syndrome.  10. Tonsillectomy.  11. Depression.  12. History of pneumonia in the past.  13. History of degenerative arthritis, predominantly of the knees.   MEDICATIONS PRIOR TO ADMISSION:  Doxepin 50 mg daily, Vytorin 3 mg at night,  Oxytrol patch 2 patches a week, Ecotrin 325 mg daily, hydrochlorothiazide  12.5 mg daily, Aricept 10 mg daily, Actos 15 mg daily, Vytorin 10/80 one  daily, Lisinopril 10 mg  daily.   ALLERGIES:  No known drug allergies.   HABITS:  The patient does not smoke or drink.   Please refer to the history and physical for social history, family history,  review of systems, and physical examination.   LABORATORY DATA:  Notable for white count 6.6, hemoglobin 11.8, hematocrit  34.8, MCV 89.5, platelets 160, sed rate 39.  Sodium 142, potassium 3.5,  chloride 109, CO2 27, glucose 124, BUN 9, creatinine 1, calcium 9.2, uric  acid 5.6.  Triglyceride level 99, HDL 38, LDL 44, VLDL 17.  Homocystine  level 17.7.  Chest x-ray shows left basilar atelectasis that improved on the  second chest x-ray done on October 14, 2005.  The patient had x-rays of both  knees sowing moderate to severe tricompartmental osteoarthritis with  chondrocalcinosis, no fractures or dislocations noted.   HOSPITAL COURSE:  The patient was admitted to [facility] on the  3000 unit and evaluated for a stroke.  The patient underwent an MRI scan of  the brain that revealed no evidence of an acute stroke.  Active small vessel  disease was seen.  An old right sided infarction was noted.  Minimal mucosal   thickening of the ethmoid sinus air cells noted.  MRI angiogram shows no  hemodynamically significant stenosis of the carotid arteries, right  vertebral artery was dominant, proximal left vertebral artery was not  visualized, maybe stenotic, there was poor delineation of the left posterior  inferior cerebellar artery, right anterior inferior cerebellar artery, left  posterior cerebral artery.  The patient underwent a 2D echocardiogram that  reveals no cardiac source of embolism.  This was a very difficult study due  to body habitus.  Carotid Doppler study was unremarkable.  The patient was  seen by physical therapy and occupational therapy and continued to have  trouble with walking, speech seemed to improve a bit during admission.  The  patient initially failed the bedside swallow evaluation and was seen by  speech therapy.  The patient was placed on dysphagia III thin liquid diet.  It was thought possible that the patient could have had a small medullary  stroke that was not visualized on the MRI scan.  The patient has been seen  by rehab and was transferred to the rehab service on October 15, 2005.   DISCHARGE MEDICATIONS:  Tylenol 650 mg q.4h. p.r.n., Ecotrin 325 mg daily,  Cipro has been used for febrile illness, taking 250 mg q.12h., the patient  has had negative urine and blood cultures so far, again, the patient has  been afebrile for the last 24-48 hours.  The patient will be continued on  Aricept 10 mg daily, Doxepin 50 mg h.s., Vytorin 10/80, one tablet daily,  hydrochlorothiazide 12.5 mg daily, Lisinopril 10 mg daily, Actos 15 mg  daily, potassium 10 mEq daily, Ultram as needed, Phenergan as needed.  During this admission, the patient has had some problems with low potassium  levels and this has been corrected with oral medications.   At the time of discharge, the patient is bright, alert, cooperative, is not able to ambulate independently, moves all fours fairly well, does  have a  residual left hemiparesis, mild dysarthria.  Visual fields are full.      [name], M.D.  Electronically Signed     CKW/MEDQ  D:  10/15/2005  T:  10/15/2005  Job:  [id number]   cc:   Hal T. Stoneking, M.D.  Fax: [phone]   Lind Guest.  August Saucer, M.D.  Fax: [phone]

## 2011-02-01 NOTE — H&P (Signed)
[name], [name]                   ACCOUNT NO.:  [id number]   MEDICAL RECORD NO.:  [id number]          PATIENT TYPE:  IPS   LOCATION:  4033                         FACILITY:  [facility]   PHYSICIAN:  Ranelle Oyster, M.D.DATE OF BIRTH:  03/03/1926   DATE OF ADMISSION:  10/15/2005  DATE OF DISCHARGE:                                HISTORY & PHYSICAL   HISTORY AND PHYSICAL:  This is a 77 year old black female with a history of  hypertension, diabetes, who has had prior stroke with residual left  hemiparesis, who was admitted with a near syncopal episode on January 26  with problems with general balance and gait and slurred speech.  Fever was  noted on admission.  MRI revealed no acute infarct but atrophy and small-  vessel disease.  Carotid Dopplers were negative.  The patient was placed on  IV Cipro for presumptive left lower lobe pneumonia.  The patient complained  of bilateral knee pain, right greater than left, and x-rays done revealed  moderate to severe osteoarthritis on the right and moderate osteoarthritis  on the left.  The uric acid level was normal.  Orthopedics was consulted who  recommended conservative care and follow up with Dr. Thurston Hole on an outpatient  basis.  The patient continued to have complaints of knee instability and  pain with weightbearing.   REVIEW OF SYSTEMS:  Positive for shortness of breath, urinary retention,  nonproductive cough, insomnia, and depression.  Other pertinent positives  are as listed above.   PAST MEDICAL HISTORY:  Positive for:  1.  Right CVA with left hemiparesis.  2.  Obesity.  3.  Diabetes, type 2.  4.  Left breast cancer.  5.  Appendectomy.  6.  Urinary incontinence.  7.  Depression.  8.  Uterine suspension procedure.  9.  Left humerus fracture.  10. COPD.  11. Hypertension.  12. Insomnia.  13. Chronic right hip and left knee pain.   FAMILY HISTORY:  Positive for stroke and coronary artery disease.   SOCIAL HISTORY:  The  patient lives at [facility].  She  has been there since September 2005.  She has a 39-pack-year history of  smoking.  She is independent with a cane.  The patient does not drive.   MEDICATIONS PRIOR TO ARRIVAL:  1.  Doxepin 50 mg a day.  2.  Melatonin 3 mg nightly.  3.  Oxytrol patch 2 times a week.  4.  Ecotrin 325 mg daily.  5.  Hydrochlorothiazide 12.5 mg daily.  6.  Actos 15 mg daily.  7.  Aricept 10 mg daily.  8.  Vitorin daily.  9.  Lisinopril 10 mg daily.  10. Multivitamins daily.   PHYSICAL EXAMINATION:  VITAL SIGNS:  Blood pressure 124/82, pulse 84,  respiratory rate 16.  GENERAL:  The patient is pleasant, in no acute distress.  She is large frame  and moderately obese.  She is alert and oriented x3.  HEENT: Pupils equal, round, and reactive to light and accommodation.  Extraocular eye movements are intact.  Exam is unremarkable.  Oral mucosa is  pink and moist.  NECK:  Supple without JVD or lymphadenopathy.  CHEST:  Reveals decreased breath sounds at the left base and occasional  expiratory wheezes today.  HEART: Regular rate and rhythm without murmurs, rubs, or gallops.  ABDOMEN: Soft, nontender. Bowel sounds are positive.  NEUROLOGIC: The patient has some residual left-sided weakness at 4/5 left  upper extremity, 4+/5 left lower extremity.  Right lower extremity is  generally 5/5 except for pain inhibition at the knee.  Reflexes are  hyperactive on the left. Sensation is grossly intact.  Cranial nerve  examination reveals left central seventh and dysarthria.  Judgment,  orientation, and mood were all appropriate. Memory is bit decreased for  short-term information.  EXTREMITIES:  Both knees were slightly tender to palpation, and there was  some peripatellar swelling on the right more so than the left.  No  structural instability was noted.   ASSESSMENT AND PLAN:  1.  Functional deficits secondary to gait disorder, likely due to dementia,       old cerebrovascular accident, osteoarthritis of the knees, and obesity.      Again, comprehensive inpatient rehabilitation with modified independent      goals.  PT will assess for mobility, gait, and lower extremity strength.      OT will assess for self care and ADLs.  Nursing will follow for bowel,      bladder, and skin management.  Social work and Sports coach will asses      for psychosocial needs.  Estimated length of stay: 7 to 10 days.      Prognosis is good.  2.  Pain management: Schedule Ultram 4 times a day.  Ice twice daily to      knees.  I recommend steroid injections to the knees rather than waiting      until the patient is out of the hospital.  I also recommend Neoprene      knee sleeve for the right leg.  3.  Deep vein thrombosis prophylaxis with mobility.  4.  Left lower lobe pneumonia: Change IV Cipro to p.o. and continue for 10      days.  5.  Urinary retention: Discontinue Oxytrol patch.  Foley is in place      currently.  Will discontinue and begin voiding trial.  6.  Hypertension: Monitor on hydrochlorothiazide and Prinivil.  7.  Insomnia: Resume melatonin at bedtime.  8.  Hypokalemia: Check evening labs.  9.  Presyncopal episode, likely secondary to febrile illness: Monitor      clinically while on the rehab floor.      Ranelle Oyster, M.D.  Electronically Signed     ZTS/MEDQ  D:  10/15/2005  T:  10/15/2005  Job:  [id number]

## 2011-02-01 NOTE — Discharge Summary (Signed)
[name], [name]                   ACCOUNT NO.:  [id number]   MEDICAL RECORD NO.:  [id number]          PATIENT TYPE:  INP   LOCATION:  5021                         FACILITY:  [facility]   PHYSICIAN:  Dimple Casey, PA-C    DATE OF BIRTH:  02-08-1926   DATE OF ADMISSION:  12/12/2005  DATE OF DISCHARGE:  12/17/2005                                 DISCHARGE SUMMARY   FINAL DIAGNOSES:  1.  Status post left total knee replacement for end-stage degenerative joint      disease.  2.  Hyperlipidemia.  3.  Type 2 diabetes mellitus.  4.  Hypertension.  5.  History of breast cancer.  6.  Schizophrenia disorder.  7.  Depression.  8.  Dementia.  9.  Chronic obstructive pulmonary disease.  10. Anemia.   HISTORY OF PRESENT ILLNESS:  A 77 year old female with history of end-stage  DJD left knee and chronic pain presented to our office for preoperative  evaluation for total knee replacement. She had progressive worsening and  pain which failed to respond with conservative treatment. Significant  decrease in her daily activity due to the ongoing complaint.   HOSPITAL COURSE:  On December 12, 2005, the patient was taken to the OR and a  left total knee replacement procedure was performed. Surgeon is Loreta Ave, M.D.; assistant Dimple Casey, PA-C; anesthesia was general. No  specimens. EBL minimal. Tourniquet time was 1 hour 54 minutes. One Hemovac  drain placed. There were no surgical or anesthesia complications. The  patient was transferred to recovery in stable condition. On December 13, 2005,  the patient was doing well with good pain control. Some nausea with the  Percocet. Started on pharmacy protocol Coumadin. Temperature 100.4, pulse  101, respirations 16, blood pressure 89/54 and rechecked at 110/68.  Hemoglobin 9.7. INR 1.2. Dressing reinforced. Discontinued Percocet and  started Vicodin. Advised incentive spirometry. Evaluated by PT and OT.  Medicine consulted. On December 14, 2005, the  patient was doing well. No  complaints of chest pain or shortness of breath. The patient was transfused  two RBCs. Temperature 99.5, pulse 71, respirations 18, blood pressure  104/43. Wound looks good and staples intact. No drainage or signs of  infection. Hemovac drain discontinued. Calf nontender and neurovascularly  intact. Discontinued Foley and PCA. Started her on Tylenol #3 for pain. On  December 15, 2005, the patient was doing well. Hemoglobin 9.7, INR 1.5.  Vital  signs were stable and afebrile. Wound looks good, staples intact. No signs  of infection. Calf nontender and neurovascularly intact. On December 16, 2005,  the patient was doing well with good pain control. Ambulated to door in her  room. Temperature was 99.4, pulse 82, respirations 20, blood pressure  130/74. Hemoglobin 10.8 after 2 units of packed red blood cells. Wound  looked good, staples intact. No drainage or signs of infection. Calf  nontender and neurovascularly intact. INR 1.8.  On December 17, 2005, the  patient was doing well. Good pain control. No chest pain or shortness of  breath. Vital signs stable and afebrile.  Hemoglobin 10.9 and INR 1.8.  Electrolytes stable. Wound looked good, staples intact. No signs of  infection. Calf nontender and neurovascularly intact. The patient was stable  and was ready for transfer to inpatient rehab.   MEDICATIONS:  Continue all current medications.   CONDITION:  Good and stable.   DISPOSITION:  Transfer to inpatient rehab.   INSTRUCTIONS:  The patient will continue to work with PT and OT to improve  knee range of motion, strengthening, and her ambulation. Staples are to be  removed two weeks postoperatively. Coumadin times four weeks postoperative  for DVT prophylaxis. Will follow up in the office in a couple of weeks for  recheck. Return sooner if needed.           ______________________________  Dimple Casey, PA-C     JM/MEDQ  D:  01/20/2006  T:  01/20/2006  Job:   [id number]

## 2011-02-01 NOTE — Discharge Summary (Signed)
[name], [name]                   ACCOUNT NO.:  [id number]   MEDICAL RECORD NO.:  [id number]          PATIENT TYPE:  IPS   LOCATION:  4007                         FACILITY:  [facility]   PHYSICIAN:  Ellwood Dense, M.D.   DATE OF BIRTH:  [birth year]   DATE OF ADMISSION:  12/17/2005  DATE OF DISCHARGE:  12/26/2005                                 DISCHARGE SUMMARY   DISCHARGE DIAGNOSES:  1.  Left knee osteoarthritis requiring left total knee replacement.  2.  Hypertension.  3.  Diabetes mellitus, type 2.  4.  Chronic obstructive pulmonary disease.  5.  Schizophrenia.  6.  Obesity.  7.  Acute blood loss anemia.   HISTORY OF PRESENT ILLNESS:  [name] is a 77 year old female with a  history of hypertension, diabetes, end-stage DJD of left knee with failure  of conservative therapy and progressive instability, left lower extremity,  with decrease in ambulation. She elected to undergo left total knee  replacement on March 29th by Dr. Eulah Pont. Postoperatively she was  weightbearing as tolerated with CPM being used for passive range of motion.  She is currently on Coumadin and heparin for DVT prophylaxis. Issues post  surgery include problems with hypertension secondary to narcotics, oxycodone  discontinued. She also was noted to have acute blood loss anemia requiring  treatment with two units of packed red blood cells. BP is noted to be  elevated and she was started on low-dose ACE for blood pressure control  currently with impairments in mobility and self care and rehab consulted for  further therapy.   PAST MEDICAL HISTORY:  1.  COPD.  2.  Right CVA with left hemiparesis.  3.  Uterine suspension.  4.  Dyslipidemia.  5.  Hemorrhoids.  6.  Left modified radical mastectomy for breast cancer.  7.  Anemia.  8.  Dementia.  9.  Depression.  10. Obesity.  11. History of syncope.   ALLERGIES:  No known drug allergies.   FAMILY HISTORY:  Positive for coronary artery disease, CVA.   SOCIAL HISTORY:  The patient lives at [facility]. She has been wheelchair bound except for a few steps prior to  admission secondary to knee instability for the past one and a half months.  He does not use tobacco or alcohol.   HOSPITAL COURSE:  [name] was admitted to rehab on December 17, 2005, for  inpatient therapies to consist of PT and OT daily. Post admission she was  maintained on Coumadin for DVT prophylaxis. Follow-up labs on postoperative  anemia showed H&H to be stable with hemoglobin of 10.5, hematocrit 30.7,  white count 5.9. Recheck of lytes reveal sodium of 137, potassium 3.7,  chloride 107, CO2 23, BUN 16, creatinine 0.9, glucose 98. Stool guaiac times  one was negative. The patient's knee incision has been monitored along and  noted to be healing well without any signs or symptoms of infection. Staples  remained intact. The patient is to have staples removed on December 27, 2005,  by home health nurse post discharge. Pain  control has been reasonably with  use of p.r.n. Tylenol alone. Blood pressures have been monitored on a b.i.d.  basis and were noted to be elevated. Dr. August Saucer has been following the patient  along and BP meds adjusted. At the time of discharge BP was running in the  140s systolic to 60s to 80s diastolic. Blood sugars are stable at 106 to 118  range. Her p.o. intake has been good. The patient has been continent of  bowel and bladder. During her stay in rehab the patient has progressed along  with improvement in left knee flexion to 90 degrees. She continues to have  problems with left knee straight leg raise.  She is currently at supervision  for ambulating short distances requiring occasional moderate assistance  secondary to left knee instability. Wheelchair mobility is recommended with  ambulating for short distances. She is to continue with further follow-up of  home health PT/OT for [facility] post  discharge. Home  health RN arranged for protime draws with next Coumadin draws to be on December 30, 2005. Coumadin to continue until April 29th. On December 26, 2005, the  patient is discharged to home.   DISCHARGE MEDICATIONS:  1.  Coumadin 4 mg p.o. q.p.m.  2.  Vytorin 10/80 q.p.m.  3.  Aricept 10 mg q.p.m.  4.  Actos 50 mg daily.  5.  Advair 100/50 one puff b.i.d.  6.  Senokot 50 mg daily.  7.  Theragran 325 mg daily.  8.  Prinivil 5 mg daily.  9.  Resume Ecotrin past April 29th once Coumadin discontinued.   DIET:  Diabetic.   ACTIVITY:  As tolerated, wheelchair level, walk only with assist.   WOUND CARE:  Wash area with antibiotic soap and water, keep clean and dry.   PAIN MANAGEMENT:  Tylenol p.r.n. pain.   FOLLOWUP:  The patient is to follow up with Dr. Eulah Pont for postoperative  check; follow up with Dr. Pete Glatter for routine check; follow up with Dr.  Lamar Benes as needed.      Greg Cutter, P.A.    ______________________________  Ellwood Dense, M.D.    PP/MEDQ  D:  12/26/2005  T:  12/27/2005  Job:  [id number]   cc:   Hal T. Stoneking, M.D.  Fax: [phone]   Loreta Ave, M.D.  Fax: [phone]   Lind Guest. August Saucer, M.D.  Fax: [phone]

## 2011-02-01 NOTE — Op Note (Signed)
NAMELATERA, MCLIN                   ACCOUNT NO.:  [id number]   MEDICAL RECORD NO.:  [id number]          PATIENT TYPE:  INP   LOCATION:  5021                         FACILITY:  [facility]   PHYSICIAN:  Loreta Ave, M.D. DATE OF BIRTH:  April 14, 1926   DATE OF PROCEDURE:  12/12/2005  DATE OF DISCHARGE:                                 OPERATIVE REPORT   PREOPERATIVE DIAGNOSIS:  End-stage degenerative arthritis and valgus  alignment, left knee.   POSTOPERATIVE DIAGNOSIS:  End-stage degenerative arthritis and valgus  alignment, left knee.   OPERATIVE PROCEDURE:  Left total knee replacement with [employer]  prosthesis.  Soft tissue balancing with a limited lateral retinacular  release.  A cemented #5 posterior stabilized pegged femoral component.  Cemented #5 tibial component.  A 9 mm polyethylene insert, posterior-  stabilized.  Cemented resurfacing 35 mm medial-offset patellar component.  Minimally invasive system.   SURGEON:  Loreta Ave, M.D.   ASSISTANT:  Genene Churn. Denton Meek.   ANESTHESIA:  General.   ESTIMATED BLOOD LOSS:  Minimal.   SPECIMENS:  None.   CULTURES:  None.   COMPLICATIONS:  None.   DRESSING:  Soft compressive with knee immobilizer.   TOURNIQUET TIME:  90 minutes.   PROCEDURE:  Patient brought to the operating room and after adequate  anesthesia had been obtained, left knee examined.  Increased valgus but  correctable to a reasonable position.  Lateral tracking and tethering of the  patellofemoral joint.  Stable ligaments.  Almost full extension, very  reasonable flexion.  Tourniquet applied, prepped and draped in the usual  sterile fashion.  Exsanguinated with elevation and Esmarch, tourniquet  inflated to 350 mmHg.  A straight incision above the patella down to the  tibial tubercle.  Medial arthrotomy up to the top of the patella and then a  vastus-splitting incision for a minimally invasive approach.  That joint  exposed.  Hypertrophic  synovitis, remnants of menisci, cruciate ligaments  excised.  Most marked changes in the patellofemoral and lateral compartment.  Distal femur exposed.  The intramedullary guide placed.  Distal cut in 5  degrees of valgus, resecting about 11 mm off.  Sized for a #5 component.  A  jig was put in place, definitive cuts made, found to fit well.  Tibia  exposed.  Extramedullary guide.  A 3 degree posterior slope cut, resecting 4-  5 mm down from the most deficient side.  Sized for a #5 component.  Patella  was measured and then the posterior 10 mm resected off the back of the  patella after spurs removed.  The quad and patellar tendon preserved.  Sized  and drilled for a 35 mm component.  Trials put in place throughout.  Appropriate external rotation of the femoral component so that I had with  the 9 mm polyethylene insert a nice, stable knee with full motion, full  extension, full flexion and good patellofemoral tracking.  She still  tethered somewhat, and there was one very thick band in the middle of the  retinaculum, which was released down near the  femoral attachment.  This gave  excellent patellofemoral tracking, good stability.  Tibia marked for  appropriate rotation and hand-reamed.  All trials removed.  Copious  irrigation with the pulse irrigating device.  Cement prepared and placed on  all components, which were firmly seated and held them in place.  Polyethylene attached to the tibia. Once the cement hardened, the knee was  reexamined.  Full extension and full flexion, good stability in flexion and  extension and excellent patellofemoral tracking.  A Hemovac placed.  Arthrotomy closed with #1 Vicryl, skin and subcutaneous tissue with Vicryl  and staples.  Knee injected with Marcaine and the Hemovac clamped.  A  sterile compressive dressing applied.  Tourniquet deflated and removed.  Knee immobilizer applied.  Anesthesia reversed.  Brought to the recovery  room.  Tolerated the  surgery well with no complications.      Loreta Ave, M.D.  Electronically Signed     DFM/MEDQ  D:  12/12/2005  T:  12/13/2005  Job:  [id number]

## 2011-02-01 NOTE — Discharge Summary (Signed)
[facility]  Patient:    [name], [name] Visit Number: [id number] MRN: [id number]          Service Type: MED Location: [id number] Attending Physician:  Ginette Otto Dictated by:   Hal T. Stoneking, M.D. Admit Date:  05/23/2001 Disc. Date: 05/28/01                             Discharge Summary  ADMISSION DIAGNOSIS: Questionable congestive heart failure.  DISCHARGE DIAGNOSES: 1. Deconditioning from previous stroke and recent fall. 2. Left shoulder humeral fracture, followed by Dr. Eulah Pont. 3. Depression. 4. Diet controlled diabetes. 5. History of left body weakness and gait disorder secondary to stroke    in 1997.  BRIEF HISTORY: The patient is a delightful, 77 year old, Native American woman, who has been living in her own home. She had been walking earlier and had trouble with her gait, fell and sustained a fracture to her left humerus. She was getting along the [facility], was released to Dr. Alfonso Patten home.  She then developed increasing shortness of breath on exertion, was brought to the [facility] Emergency Room for evaluation.  PHYSICAL EXAMINATION:  VITAL SIGNS: Blood pressure 156/80, pulse 94, respiratory rate 20, O2 saturation 95% on room air.  HEENT: Unremarkable.  LUNGS: Revealed occasional expiratory wheeze, crackle at the right base.  HEART: Regular rate and rhythm without murmur, rub or gallop.  ABDOMEN: Soft.  No hepatosplenomegaly or masses palpated.  LABORATORY DATA: Hemoglobin 12.5, white count 12,700. Sodium 146, potassium 4.0, BUN 25, creatinine 1.5, glucose 146.  ECG changes, normal sinus rhythm.  No acute changes. Chest x-ray showed question cardiomegaly and vascular congestion. CT scan of the brain revealed the old right caudate lacunar infarct.  No acute hemorrhage or changes seen.  HOSPITAL COURSE: The patient was admitted to a regular hospital bed.  She was given IV Lasix in the emergency  room. Followup chest x-ray showed only slight increase in left base atelectasis. Pulmonary vasculature was felt to be normal.  She had a 2-D echocardiogram which revealed the overall left ventricular systolic function to be normal with an ejection fraction estimated of 55-65%.  There was abnormal left ventricular relaxation consistent with aging.  Her dyspnea seemed to improve.  She did benefit from physical therapy. It is felt at this time that she would be too weak to return home on her own and will additional help with physical therapy. Also daughter and patient are making arrangements to have her stay locally, possibly in assisted living.  Of note, her potassium did drop to 2.8 after the Lasix was given, came up to 3.5 after p.o. replacement, but 3.3 the day prior to discharge.  He will be given two extra doses of Lasix today.  Diet controlled diabetes. Her blood sugars remained very good in the range of 146 to 111.  DISPOSITION: The patient is discharged to a skilled facility for additional physical therapy.  She had originally been started on Tequin for the possibility of pneumonia; this will be discontinued.  She will be on doxepin 50 mg at night, Lescol XL 80 mg at night, Femara 2.5 mg p.o. q.d.  She should have her CBG checked q.a.m. Tylenol 500 mg p.o. q.6h. p.r.n. pain, enteric-coated aspirin 325 mg once a day. Prolixin 0.3 cc subcutaneous IM every four weeks. Dictated by:   Hal T. Stoneking, M.D. Attending Physician:  Ginette Otto DD:  05/27/01 TD:  05/27/01 Job: [id number]

## 2011-02-01 NOTE — Consult Note (Signed)
[name]                   ACCOUNT NO.:  [id number]   MEDICAL RECORD NO.:  [id number]          PATIENT TYPE:  INP   LOCATION:  3016                         FACILITY:  [facility]   PHYSICIAN:  [name], M.D.  DATE OF BIRTH:  Jan 26, 1926   DATE OF CONSULTATION:  10/14/2005  DATE OF DISCHARGE:                                   CONSULTATION   REFERRING PHYSICIAN:  Dr. Lesia [name].   IDENTIFICATION:  [name] is a 77 year old black single female, disabled.   CHIEF COMPLAINT:  Bilateral painful knees.   HISTORY:  This is a 77 year old black female with a history of  osteoarthritis of both knees that has been followed by Dr. Loreta [name]  in the past for this diagnosis.  [name] was apparently seen last in March of  2004 by Dr. Eulah [name] and was diagnosed with an insufficiency fracture of the  left femoral condyle, medial and lateral meniscal tears on MRI.  She never  returned for followup.   On Friday, the 26th of January 2007, she states that approximately 6:30 in  the morning she was getting up to go to the bathroom when she had some type  of syncopal episode and fell to the floor, apparently landing on her back.  Her roommate at the nursing facility summoned help.  According to notes, she  apparently had slurred speech and numbness of both lower extremities from  the knees down.  She was [facility] to [name] and admitted for a rule-out stroke.  In regards to her knees,  apparently she did have some pain initially, but she states that the pain is  now gone and is now just a soreness.  She states she uses Tylenol on a daily  basis to help with her pains.  She is seen at the request of Dr. Anne [name] for  evaluation.   PAST MEDICAL HISTORY:  Her general healthy is fair.   Hospitalizations have included that of:  1.  Breast cancer.  2.  Appendectomy.  3.  Uterine resuspension procedure.  4.  Tonsillectomy.   She has also had a history of:  1.  Depression.  2  Pneumonia.   PAST SURGICAL HISTORY:  Surgery as above.   MEDICATIONS:  1.  Doxepin 50 mg daily.  2.  Melatonin 3 mg nightly.  3.  Oxytrol patch -- two patches as week.  4.  Ecotrin 325 mg daily.  5.  Hydrochlorothiazide 12.5 mg daily.  6.  Aricept 10 mg daily.  7.  Actos 15 mg daily.  8.  Vytorin 10/80 mg daily.  9.  Lisinopril 10 mg daily.   ALLERGIES:  No known drug allergies.   REVIEW OF SYSTEMS:  Fourteen-point review of systems is positive for  diabetes, hypertension, mild organic brain syndrome, depression, pneumonia  and breast cancer.   FAMILY HISTORY:  Mother had died from cardiac disease.  Father died from  myocardial infarction.  She has had 1 sibling die of cancer and 1 from a  seizure; 1 also had a myocardial  infarction.  The other one died at an  elderly age.   SOCIAL HISTORY:  [age]-year-old black single female, disabled.  She  lives in an extended care facility.  She denies use of tobacco or alcohol.   PHYSICAL EXAMINATION:  GENERAL:  Examination today reveals a very pleasant  77 year old black single female, well-developed, well-nourished, mildly  obese, alert, pleasant, cooperative, in mild distress at present.  She is  oriented at least x2.  VITAL SIGNS:  Temperature is 98.1 degrees, pulse 88, respirations 20, blood  pressure 133/75.  O2 SATS are 93% on room air.  SKIN:  Her skin is intact without ecchymosis or erythema over the bilateral  lower extremities.  EXTREMITIES:  Left knee reveals range of motion from 4 degrees to 100  degrees with crepitus with range of motion.  She has trace to 1+ effusion.  She is tender about the medial and lateral joint lines diffusely.  Otherwise, she is nontender.  No real warmth.  Varus and valgus stressing  show good end points.  Negative Lachman's and drawer.  Calf is supple and  nontender.  Right knee reveals range of motion of 5-105 degrees with  crepitance with range of motion, 1+  effusion, tender at the medial and  lateral joint lines.  She has good stability with varus and valgus  stressing.  Negative Lachman's and drawer.  NECK:  Supple and nontender.  PERIPHERAL VASCULAR:  She has bilateral posterior tib pulses.  NEUROMUSCULAR:  Her sensation is intact bilaterally to light touch.  EHL,  FHL, anterior and posterior tibialis are intact with testing.  She has good  quad and hamstring strength bilaterally.  Hips have smooth motion without  crepitance.  Ankles move without difficulty and no occult findings are  noted.   RADIOLOGIC FINDINGS:  X-rays today reveal bilateral osteoarthritis, moderate  to severe, with chondrocalcinosis bilaterally.   CLINICAL IMPRESSION:  1.  Bilateral osteoarthritis with chondrocalcinosis.  2.  Hypertension.  3.  Diabetes mellitus.  4.  Breast carcinoma.  5.  Organic brain syndrome.  6.  Recent presyncopal/syncopal episode.   RECOMMENDATIONS:  At this time, since her pain is improved to now only a  soreness, we will stay with conservative treatment.  Certainly, if she has  difficulties, we may consider aspiration and injection of the knees if  needed, but at this time, she is not painful enough to consider this.  She  can follow up back up with Dr. Eulah [name], her orthopedist of choice, in the  next 2 weeks or if symptoms have not resolved.   Thank you for this interesting consultation.   Sincerely yours,      Oris Drone. Petrarca, P.A.-C.      Carlisle Beers. [name], M.D.  Electronically Signed    BDP/MEDQ  D:  10/14/2005  T:  10/15/2005  Job:  [id number]

## 2011-02-01 NOTE — Discharge Summary (Signed)
[name], [name]                   ACCOUNT NO.:  [id number]   MEDICAL RECORD NO.:  [id number]          PATIENT TYPE:  IPS   LOCATION:  4033                         FACILITY:  [facility]   PHYSICIAN:  Ellwood Dense, M.D.   DATE OF BIRTH:  [birth year]   DATE OF ADMISSION:  10/15/2005  DATE OF DISCHARGE:  10/25/2005                                 DISCHARGE SUMMARY   DISCHARGE DIAGNOSES:  1.  Degenerative joint disease, bilateral knees with decrease in mobility.  2.  Hypertension with occasional orthostasis, resolved.  3.  Diabetes mellitus type 2.  4.  Mild dementia.  5.  Cerebrovascular accident with left hemiparesis.   HISTORY OF PRESENT ILLNESS:  [name] is a 77 year old female with a  history of hypertension, DM and prior CVA admitted January 26 with a near  syncope fall and inability to walk.  She was noted to have numbness  bilateral lower extremity and slurred speech.  Also, noted to be febrile  with T-max of 101 at admission.  MRI and MRA of brain done showed no acute  infarct, atrophy, small-vessel disease, poor delineation of left PICA, right  AC and left PICA.  Carotid Dopplers showed no ICA stenosis.  A 2-D echo was  a poor study.  A chest x-ray was done secondary to continued fevers.  She  was noted to have a left basilar atelectasis and was started on IV Cipro  with improvement in her symptomatology, and has been afebrile currently.  The patient also had complaint of bilateral knee pain.  X-rays done showed  moderate to severe OA on right and moderate OA on left.  Orthopaedics was  consulted for input and recommended conservative management with follow up  on an outpatient basis.  Currently, the patient continues with impaired gait  and bilateral knee pain.  She continues to require assistance with ADLs and  mobility.  Rehab was consulted for further therapies.   PAST MEDICAL HISTORY:  Significant for:  1.  Right CVA with left hemiparesis.  2.  Obesity.  3.  DM type  2.  4.  A history of left breast cancer.  5.  Appendectomy.  6.  Urinary incontinence with uterine suspension.  7.  Depression.  8.  Insomnia.  9.  Mild OBS.  10. Left humerus fracture in 2002.  11. Mild COPD.  12. Chronic right hip and left knee pain.   ALLERGIES:  NO KNOWN DRUG ALLERGIES.   FAMILY HISTORY:  Positive for CVA and coronary artery disease.   SOCIAL HISTORY:  The patient lives in an assisted living facility and was  independent with cane prior to admission.  Does not drive.  She does not use  any alcohol and has not used any tobacco since September of 2005.   HOSPITAL COURSE:  [name] was admitted to rehab on October 15, 2005  for inpatient therapies to consist of PT and OT daily.  Past admission, the  patient was maintained on Cipro for 10 total days of antibiotic therapy for  left lower lobe pneumonia.  The  patient with some issues regarding urinary  retention, Oxytrol patch was DC'd, Foley, which had been in place, was DC'd  past admission also.  The patient was noted to be voiding without difficulty  once Foley removed.  Labs done past admission include check off CBC:  H&H  initially was at 10.7 and 31.9, recheck of February 3 shows hemoglobin 11  and hematocrit 32.5, white count 6 and platelets 251.  Check off labs  revealed sodium 141, potassium 3.8, chloride 108, CO2 27, BUN 15, creatinine  1 and glucose 104.  Protein stores low at 30, protein at 5.9 and albumin at  2.7.  The patient was initially started on Ultram for pain control and to  help with mobilities secondary to knee pain.  However, the patient's family  felt this was too sedating for her and preferred not to use any pain  medicines during her stay.  Currently, p.r.n. Tylenol is being used for knee  pain.  Also, bilateral knee braces were ordered to help with knee  instability.  Bilateral knees were injected on October 21, 2005 with 1 mL of  Kenalog and 3 mL of lidocaine.  The patient with  minimal relief with steroid  injections.  Orthopaedics was re-consulted on February 8 and felt the  patient's instability and deformity, left greater than right, secondary to  DJD.  Only variable option was total knee replacement to help replace the  knee and help assist with stability and balance.  It is recommended that  patient with orthopaedics in 3 to 4 weeks if desiring a knee replacement.  Currently, secondary to instability and fall risk, 24-hour supervision is  recommended past discharge.   The patient was noted to have some dizziness past admission secondary to  orthostasis.  BP meds were adjusted and currently patient off lisinopril and  HCTZ with blood pressures running from 130s to 150s systolics and 70s to 80s  diastolic.  Blood sugars checked on [facility].  His level has showed good control  ranging from 100 to 120s range.  P.o. intake has been good.  The patient has  been continent of bowel and bladder.  The patient has participated with  therapy and currently has progressed to being set up assist for upper body  care and close supervision to minimal assist for lower body care.  Minimal  assist for toileting.  She is currently at minimal assist for transfers,  supervision for sitting balance, supervision for standing balance and  minimal to moderate assist for ambulating 40 to 80 feet.  Twenty-four-hour  supervision is recommended past discharge and the patient's assisted living  facility will be able to provide the amount of assistance needed.  A  wheelchair was ordered for mobility.  The patient and family have been  instructed that patient to transfer and ambulate only with assistance for  now.  She will continue with further follow up home health PT and OT [facility] past discharge on October 25, 2005.  The patient is  discharged to home.   DISCHARGE MEDICATIONS:  1.  Coated aspirin 325 mg a day.  2.  Vytorin 10-80 q.h.s.  3.  Aricept 10 mg q.h.s. 4.  Actos 15  mg a day.  5.  Tylenol 325 mg 2 p.o. q.h.s. and 2 p.o. t.i.d. p.r.n.   DIET:  Low-fat, diabetic.   ACTIVITY:  As tolerated with braces on bilateral knees and at wheelchair  level, 24-hour supervision for all mobility.  SPECIAL INSTRUCTIONS:  No alcohol and no smoking.   FOLLOW UP:  Patient to follow-up with Dr. Pete Glatter for routine check.  Follow-up with Dr. Eulah Pont in 3 to 4 weeks for input regarding knee  replacement.  Follow up with Dr. Lamar Benes as needed.      Greg Cutter, P.A.    ______________________________  Ellwood Dense, M.D.    PP/MEDQ  D:  10/25/2005  T:  10/26/2005  Job:  [id number]   cc:   Marlan Palau, M.D.  Fax: [phone]   Hal T. Stoneking, M.D.  Fax: [phone]   Loreta Ave, M.D.  Fax: [phone]

## 2011-02-01 NOTE — H&P (Signed)
[place]  Patient:    [name], [name] Visit Number: [id number] MRN: [id number]          Service Type: Attending:  Tyson Dense, M.D. Dictated by:   Tyson Dense, M.D. Adm. Date:  05/23/01   CC:         Loreta Ave, M.D.   History and Physical  CHIEF COMPLAINT: Shortness of breath, dyspnea, weakness.  HISTORY OF PRESENT ILLNESS: The patient is a 77 year old female, who was seen the day before at [facility] after a fall.  She had tripped while walking and suffered a left humeral fracture as well as left toe fractures. She had a history of CVA, with ataxia and some left-sided weakness.  She was evaluated by Dr. Eulah Pont of orthopedics and was put in an immobilizer and sent home to have outpatient surgery.  She was on Relafen and aspirin and that was held.  She went to her son-in-laws place, who is Dr. August Saucer, and reported that today she was having more dyspnea on exertion and shortness of breath.  On questioning the patient says she had been having some shortness of breath for about a week or two, with nonproductive cough and no fever or chills.  She denies any swelling in her legs.  No chest pain or dizziness or headaches.  No nausea, vomiting, or diarrhea.  She is continuing to smoke half a pack a day.  PAST MEDICAL HISTORY:  1. History of breast cancer in 1998, left breast.  2. Borderline diabetes, diet controlled.  3. CVA lacunar infarct in 1997 with left-sided weakness and ataxic gait.  4. History of tobacco use.  5. Aspiration pneumonia after CVA.  6. History of depression.  PAST SURGICAL HISTORY:  1. Status post tonsillectomy.  2. Uterine prolapse surgery.  3. Status post cholecystectomy.  4. Status post appendectomy.  MEDICATIONS:  1. ______ 2.5 mg q.d.  2. Doxepin 50 mg q.h.s.  3. Lescol XL 80 mg q.d.  4. Aspirin one q.d.  5. Prolixin 0.3 cc q.4weeks.  6. Relafen 750 mg q.d. p.r.n.  SOCIAL HISTORY: Smokes half a  pack a day.  No alcohol.  [place]  She is the mother-in-law of Dr. August Saucer.  FAMILY HISTORY: Noncontributory.  PHYSICAL EXAMINATION:  VITAL SIGNS: Temperature 98.5 degrees, blood pressure 112/73 and on my examination 156/80.  Pulse 94, respirations 20.  O2 saturation 95% on room air.  GENERAL: Elderly looking female, alert, in mild distress and pain, with shoulder immobilizer.  HEENT/NECK: Neck supple.  No JVD.  Oropharynx clear.  LUNGS: Occasional expiratory wheezing with occasional noted crackles at the right base.  CARDIOVASCULAR: Regular rhythm, S1 and S2, without any murmurs, no S3 or gallop.  ABDOMEN: Soft, obese, positive bowel sounds, nontender.  EXTREMITIES: Left shoulder immobilized in a sling.  No lower extremity edema. Good pulses.  Left second and third toe body taped.  LABORATORY DATA: WBC 12.7, hemoglobin 12.5.  Chemistries, sodium 146, potassium 4.0, BUN 25, creatinine 1.5, glucose 146.  EKG showed normal sinus rhythm without ST-T wave changes.  Chest x-ray showed vascular congestion with cardiomegaly, no infiltrate.  Head CT was negative for any acute bleed, old lacunar infarct noted on the right side.  CPK and UA are pending.  IMPRESSION/PLAN: This is a 77 year old female with a history of tobacco use, borderline diabetes, and cerebrovascular accident, admitted with shortness of breath and progressive dyspnea on exertion, with some nonproductive cough. Differential includes:  1.  Either probably cardiomegaly and mild vascular congestion.  No history of     prior congestive heart failure but given risk factors I would give a dose     of Lasix in the emergency room and also check an echocardiogram and CK     and troponins.  Given the tobacco history and probably bronchitis, and     elevated WBC, I put her on Tequin 400 mg q.d. and nebulizer treatments.     Hold prednisone for now.  If she does not get better might consider  using     prednisone.  2. Left shoulder humeral fracture as per Dr. Eulah Pont of orthopedics, who will     be following her.  Hold aspirin and Relafen for anticipation of surgery     next week.  3. Depression, on Doxepin and Prolixin.  4. Diabetes, diet controlled.  Sliding-scale insulin. Dictated by:   Tyson Dense, M.D. Attending:  Tyson Dense, M.D. DD:  05/23/01 TD:  05/24/01 Job: [id number]

## 2012-05-12 ENCOUNTER — Encounter: Payer: Self-pay | Admitting: Pulmonary Disease

## 2012-05-13 ENCOUNTER — Other Ambulatory Visit: Payer: Self-pay | Admitting: Pulmonary Disease

## 2012-05-13 ENCOUNTER — Ambulatory Visit (INDEPENDENT_AMBULATORY_CARE_PROVIDER_SITE_OTHER)
Admission: RE | Admit: 2012-05-13 | Discharge: 2012-05-13 | Disposition: A | Discharge status: Home / Self Care | Payer: Medicare Other | Source: Ambulatory Visit | Attending: Pulmonary Disease | Admitting: Pulmonary Disease

## 2012-05-13 ENCOUNTER — Ambulatory Visit (INDEPENDENT_AMBULATORY_CARE_PROVIDER_SITE_OTHER): Payer: Medicare Other | Admitting: Pulmonary Disease

## 2012-05-13 ENCOUNTER — Encounter: Payer: Self-pay | Admitting: Pulmonary Disease

## 2012-05-13 VITALS — BP 138/82 | HR 72 | Temp 97.3°F | Ht 69.0 in | Wt 217.4 lb

## 2012-05-13 DIAGNOSIS — J449 Chronic obstructive pulmonary disease, unspecified: Secondary | ICD-10-CM

## 2012-05-13 MED ORDER — ALBUTEROL SULFATE (2.5 MG/3ML) 0.083% IN NEBU: 2.5000 mg | INHALATION_SOLUTION | Freq: Two times a day (BID) | RESPIRATORY_TRACT | Status: DC

## 2012-05-13 MED ORDER — BUDESONIDE 0.25 MG/2ML IN SUSP: 0.2500 mg | Freq: Two times a day (BID) | RESPIRATORY_TRACT | Status: DC

## 2012-05-13 NOTE — Progress Notes (Signed)
  Subjective:    Patient ID: [name], female    DOB: Feb 14, 1926, 78 y.o.   MRN: [id number]  HPI PCP - Donette Larry ([facility]) ENT- Jenne Pane  78 year old ex-smoker, NHR, (mother of Sherrone ) referred by ENT for evaluation of COPD. She reports chest tightness and hoarseness of voice since February 2013. She was treated in November 2012 for what sounds like an acute bronchitis episode. He ENT evaluation of her pharynx and larynx has been normal except for some atrophy of her vocal cords and the option of injection augmentation of the vocal folds is being discussed. PPI therapy did not seem to help, she denies obvious heartburn. She was also treated with Augmentin and fluticasone nasal spray for presumed sinusitis. Talking for a few minutes seems to make her hoarseness worse. She is wheelchair-bound since her knee surgery 2011and left hemiplegia '96. She denies orthopnea paroxysmal nocturnal dyspnea or sputum production. She reports occasional dry cough but denies obvious postnasal drip. She has lost 30 pounds in the last year. She also has a history of breast cancer and 97 treated with surgery. Chest x-ray showed bibasal scarring without acute changes. She was unable to perform spirometry maneuver, best FEV1 was 0.87 (45%)   Review of Systems  Constitutional: Positive for unexpected weight change. Negative for fever and appetite change.  HENT: Positive for congestion and sneezing. Negative for ear pain, sore throat, trouble swallowing and dental problem.   Respiratory: Positive for cough and shortness of breath.   Cardiovascular: Positive for leg swelling. Negative for chest pain and palpitations.  Gastrointestinal: Negative for abdominal pain.  Musculoskeletal: Negative for joint swelling.  Skin: Negative for rash.  Neurological: Negative for headaches.  Psychiatric/Behavioral: Positive for dysphoric mood. The patient is not nervous/anxious.        Objective:   Physical Exam  Gen.  Pleasant, well-nourished, in no distress, normal affect, in wheelchair ENT - no lesions, no post nasal drip Neck: No JVD, no thyromegaly, no carotid bruits Lungs: no use of accessory muscles, no dullness to percussion, decreased  without rales or rhonchi  Cardiovascular: Rhythm regular, heart sounds  normal, no murmurs or gallops, no peripheral edema Abdomen: soft and non-tender, no hepatosplenomegaly, BS normal. Musculoskeletal: No deformities, no cyanosis or clubbing Neuro:  alert, non focal, cog wheel rigidity +       Assessment & Plan:

## 2012-05-13 NOTE — Patient Instructions (Addendum)
Breathing test STOP advair Use pulmicort 0.5 mg nebs & albuterol nebs twice daily instead Chest xray today

## 2012-05-13 NOTE — Assessment & Plan Note (Addendum)
Could not perform spirometry hence unable to quantitate.- Best effort was 0.87 (45%) We'll treat as COPD. I doubt that she is able to take Advair or other inhalers accurately. We'll switch her to nebulized medications to ensure drug delivery. STOP advair Use pulmicort 0.5 mg nebs & albuterol nebs twice daily instead - this will ensure that she gets progress it was at least twice daily She can also use albuterol every 6 hours as needed for chest tightness. Flu shot and pneumonia vaccine would be recommended And contact us for early signs of bronchitis or COPD exacerbation

## 2012-06-15 ENCOUNTER — Encounter: Payer: Self-pay | Admitting: Pulmonary Disease

## 2012-06-15 ENCOUNTER — Ambulatory Visit (INDEPENDENT_AMBULATORY_CARE_PROVIDER_SITE_OTHER): Payer: Medicare Other | Admitting: Pulmonary Disease

## 2012-06-15 VITALS — BP 122/66 | HR 81 | Temp 98.7°F | Ht 68.0 in | Wt 205.0 lb

## 2012-06-15 DIAGNOSIS — J4489 Other specified chronic obstructive pulmonary disease: Secondary | ICD-10-CM

## 2012-06-15 DIAGNOSIS — J449 Chronic obstructive pulmonary disease, unspecified: Secondary | ICD-10-CM

## 2012-06-15 NOTE — Patient Instructions (Signed)
Pulmicort/ albuterol nebs bid - OK to mix Duonebs q 6h prn wheezing or shortness of breath FLu shot

## 2012-06-15 NOTE — Progress Notes (Signed)
  Subjective:    Patient ID: [name], female    DOB: 01/03/1926, 78 y.o.   MRN: [id number]  HPI PCP - Donette Larry ([facility])  ENT- Jenne Pane   78 year old ex-smoker, NHR, (mother of Sherrone ) referred by ENT for evaluation of COPD.  She reports chest tightness and hoarseness of voice since February 2013. She was treated in November 2012 for what sounds like an acute bronchitis episode. He ENT evaluation of her pharynx and larynx has been normal except for some atrophy of her vocal cords and the option of injection augmentation of the vocal folds is being discussed. PPI therapy did not seem to help, she denies obvious heartburn. She was also treated with Augmentin and fluticasone nasal spray for presumed sinusitis. Talking for a few minutes seems to make her hoarseness worse.  She is wheelchair-bound since her knee surgery 2011and left hemiplegia '96. She denies orthopnea paroxysmal nocturnal dyspnea or sputum production. She reports occasional dry cough but denies obvious postnasal drip.  She has lost 30 pounds in the last year.  She also has a history of breast cancer and 97 treated with surgery.  Chest x-ray showed bibasal scarring without acute changes.  She was unable to perform spirometry maneuver, best FEV1 was 0.87 (45%) >> changed to pulmicort & albuterol nebs 06/15/2012 Neb regimen is helping breathing is better. states her cough is gonr. denies any wheezing, chest tx,  feeling much better Voice better - can read in bible class Has more energy, color is better   Review of Systems neg for any significant sore throat, dysphagia, itching, sneezing, nasal congestion or excess/ purulent secretions, fever, chills, sweats, unintended wt loss, pleuritic or exertional cp, hempoptysis, orthopnea pnd or change in chronic leg swelling. Also denies presyncope, palpitations, heartburn, abdominal pain, nausea, vomiting, diarrhea or change in bowel or urinary habits, dysuria,hematuria, rash, arthralgias,  visual complaints, headache, numbness weakness or ataxia.     Objective:   Physical Exam  Gen. Pleasant, well-nourished, in no distress ENT - no lesions, no post nasal drip Neck: No JVD, no thyromegaly, no carotid bruits Lungs: no use of accessory muscles, no dullness to percussion, clear without rales or rhonchi  Cardiovascular: Rhythm regular, heart sounds  normal, no murmurs or gallops, no peripheral edema Musculoskeletal: No deformities, no cyanosis or clubbing, cog wheel rigidity         Assessment & Plan:

## 2012-06-15 NOTE — Assessment & Plan Note (Signed)
Seems like neb regimen has worked for her Pulmicort/ albuterol nebs bid - OK to mix Duonebs q 6h prn wheezing or shortness of breath FLu shot

## 2013-04-26 ENCOUNTER — Ambulatory Visit (INDEPENDENT_AMBULATORY_CARE_PROVIDER_SITE_OTHER): Payer: Medicare Other | Admitting: Pulmonary Disease

## 2013-04-26 ENCOUNTER — Encounter: Payer: Self-pay | Admitting: Pulmonary Disease

## 2013-04-26 VITALS — BP 124/62 | HR 75 | Temp 98.2°F | Ht 69.0 in | Wt 217.4 lb

## 2013-04-26 DIAGNOSIS — J449 Chronic obstructive pulmonary disease, unspecified: Secondary | ICD-10-CM

## 2013-04-26 NOTE — Patient Instructions (Addendum)
Prednisone 5 mg tabs  Take 2 tabs daily with food x 5ds, then 1 tab daily with food x 5ds then STOP Take ZYRTEC daily x 4 wks Take DELSYM cough syrup 2 tsp thrice daily as needed Take benzonatate 200 mcg thrice daily as needed for cough

## 2013-04-26 NOTE — Progress Notes (Signed)
  Subjective:    Patient ID: [name], female    DOB: October 05, 1925, 79 y.o.   MRN: [id number]  HPI  PCP - Donette Larry ([facility])  ENT- Jenne Pane  79 year old ex-smoker, NHR, ( [name] ) for FU of COPD & cough.  She reports chest tightness and hoarseness of voice since February 2013. She was treated in November 2012 for what sounds like an acute bronchitis episode. ENT evaluation of her pharynx and larynx has been normal except for some atrophy of her vocal cords and the option of injection augmentation of the vocal folds is being discussed. PPI therapy did not seem to help, she denies obvious heartburn. She was also treated with Augmentin and fluticasone nasal spray for presumed sinusitis. Talking for a few minutes seems to make her hoarseness worse.  She is wheelchair-bound since her knee surgery 2011and left hemiplegia '96.  She has lost 30 pounds in the last year.  She also has a history of breast cancer and 97 treated with surgery.  Chest x-ray showed bibasal scarring without acute changes.  She was unable to perform spirometry maneuver, best FEV1 was 0.87 (45%)  >> changed to pulmicort & albuterol nebs    04/26/2013 1 yr FU  Pt reports when she talks she starts coughing, she coughs up yellow phlem. Always feels like something is stuck in her throat. She stated this is everytime she talks. She denies any chest tx, no wheezing.  Breathing Ok - main issue is persistent dry cough -unclear what cough treatment has been tried already at [facility] Had fall & is wheelchair bound  Past Medical History  Diagnosis Date  . Anemia   . Arthritis   . COPD (chronic obstructive pulmonary disease)   . Chronic schizophrenia   . Dementia   . Depressive disorder   . DM (diabetes mellitus)   . Stroke   . Acute sinusitis   . Breast cancer      Review of Systems neg for any significant sore throat, dysphagia, itching, sneezing, nasal congestion or excess/ purulent secretions, fever, chills,  sweats, unintended wt loss, pleuritic or exertional cp, hempoptysis, orthopnea pnd or change in chronic leg swelling. Also denies presyncope, palpitations, heartburn, abdominal pain, nausea, vomiting, diarrhea or change in bowel or urinary habits, dysuria,hematuria, rash, arthralgias, visual complaints, headache, numbness weakness or ataxia.     Objective:   Physical Exam   Gen. Pleasant, well-nourished, in no distress ENT - no lesions, no post nasal drip Neck: No JVD, no thyromegaly, no carotid bruits Lungs: no use of accessory muscles, no dullness to percussion, clear without rales or rhonchi  Cardiovascular: Rhythm regular, heart sounds  normal, no murmurs or gallops, no peripheral edema Musculoskeletal: No deformities,rigidity + BL cogwheel         Assessment & Plan:

## 2013-04-27 NOTE — Assessment & Plan Note (Signed)
Prednisone 5 mg tabs  Take 2 tabs daily with food x 5ds, then 1 tab daily with food x 5ds then STOP Treat for upper airway cough - doubt GERD Take ZYRTEC daily x 4 wks Take DELSYM cough syrup 2 tsp thrice daily as needed Take benzonatate 200 mcg thrice daily as needed for cough

## 2013-12-16 ENCOUNTER — Other Ambulatory Visit: Payer: Self-pay | Admitting: Internal Medicine

## 2013-12-16 DIAGNOSIS — K567 Ileus, unspecified: Secondary | ICD-10-CM

## 2013-12-20 ENCOUNTER — Other Ambulatory Visit: Payer: Medicare Other

## 2013-12-21 ENCOUNTER — Emergency Department (HOSPITAL_COMMUNITY): Payer: Medicare Other

## 2013-12-21 ENCOUNTER — Other Ambulatory Visit: Payer: PRIVATE HEALTH INSURANCE

## 2013-12-21 ENCOUNTER — Inpatient Hospital Stay (HOSPITAL_COMMUNITY)
Admission: EM | Admit: 2013-12-21 | Discharge: 2013-12-23 | DRG: 689 | Disposition: A | Discharge status: Skilled Nursing Facility | Payer: Medicare Other | Attending: Internal Medicine | Admitting: Internal Medicine

## 2013-12-21 ENCOUNTER — Encounter (HOSPITAL_COMMUNITY): Payer: Self-pay | Admitting: Emergency Medicine

## 2013-12-21 DIAGNOSIS — I639 Cerebral infarction, unspecified: Secondary | ICD-10-CM | POA: Diagnosis present

## 2013-12-21 DIAGNOSIS — Z87891 Personal history of nicotine dependence: Secondary | ICD-10-CM

## 2013-12-21 DIAGNOSIS — F3289 Other specified depressive episodes: Secondary | ICD-10-CM | POA: Diagnosis present

## 2013-12-21 DIAGNOSIS — R339 Retention of urine, unspecified: Secondary | ICD-10-CM | POA: Diagnosis present

## 2013-12-21 DIAGNOSIS — Z96659 Presence of unspecified artificial knee joint: Secondary | ICD-10-CM

## 2013-12-21 DIAGNOSIS — R4182 Altered mental status, unspecified: Secondary | ICD-10-CM | POA: Diagnosis present

## 2013-12-21 DIAGNOSIS — N390 Urinary tract infection, site not specified: Principal | ICD-10-CM | POA: Diagnosis present

## 2013-12-21 DIAGNOSIS — G309 Alzheimer's disease, unspecified: Secondary | ICD-10-CM

## 2013-12-21 DIAGNOSIS — G9340 Encephalopathy, unspecified: Secondary | ICD-10-CM | POA: Diagnosis present

## 2013-12-21 DIAGNOSIS — I69959 Hemiplegia and hemiparesis following unspecified cerebrovascular disease affecting unspecified side: Secondary | ICD-10-CM

## 2013-12-21 DIAGNOSIS — J4489 Other specified chronic obstructive pulmonary disease: Secondary | ICD-10-CM | POA: Diagnosis present

## 2013-12-21 DIAGNOSIS — F329 Major depressive disorder, single episode, unspecified: Secondary | ICD-10-CM | POA: Diagnosis present

## 2013-12-21 DIAGNOSIS — F0280 Dementia in other diseases classified elsewhere without behavioral disturbance: Secondary | ICD-10-CM

## 2013-12-21 DIAGNOSIS — Z66 Do not resuscitate: Secondary | ICD-10-CM | POA: Diagnosis present

## 2013-12-21 DIAGNOSIS — R1310 Dysphagia, unspecified: Secondary | ICD-10-CM | POA: Diagnosis present

## 2013-12-21 DIAGNOSIS — Z8673 Personal history of transient ischemic attack (TIA), and cerebral infarction without residual deficits: Secondary | ICD-10-CM

## 2013-12-21 DIAGNOSIS — D649 Anemia, unspecified: Secondary | ICD-10-CM | POA: Diagnosis present

## 2013-12-21 DIAGNOSIS — F32A Depression, unspecified: Secondary | ICD-10-CM | POA: Diagnosis present

## 2013-12-21 DIAGNOSIS — F0390 Unspecified dementia without behavioral disturbance: Secondary | ICD-10-CM | POA: Diagnosis present

## 2013-12-21 DIAGNOSIS — J449 Chronic obstructive pulmonary disease, unspecified: Secondary | ICD-10-CM | POA: Diagnosis present

## 2013-12-21 DIAGNOSIS — Z7982 Long term (current) use of aspirin: Secondary | ICD-10-CM

## 2013-12-21 DIAGNOSIS — F0150 Vascular dementia without behavioral disturbance: Secondary | ICD-10-CM | POA: Diagnosis present

## 2013-12-21 DIAGNOSIS — F205 Residual schizophrenia: Secondary | ICD-10-CM | POA: Diagnosis present

## 2013-12-21 DIAGNOSIS — F209 Schizophrenia, unspecified: Secondary | ICD-10-CM | POA: Diagnosis present

## 2013-12-21 DIAGNOSIS — E860 Dehydration: Secondary | ICD-10-CM | POA: Diagnosis present

## 2013-12-21 DIAGNOSIS — I509 Heart failure, unspecified: Secondary | ICD-10-CM | POA: Diagnosis present

## 2013-12-21 DIAGNOSIS — E119 Type 2 diabetes mellitus without complications: Secondary | ICD-10-CM | POA: Diagnosis present

## 2013-12-21 HISTORY — DX: Schizophrenia, unspecified: F20.9

## 2013-12-21 HISTORY — DX: Spinal stenosis, lumbosacral region: M48.07

## 2013-12-21 HISTORY — DX: Age-related osteoporosis without current pathological fracture: M81.0

## 2013-12-21 LAB — GLUCOSE, CAPILLARY
GLUCOSE-CAPILLARY: 104 mg/dL — AB (ref 70–99)
GLUCOSE-CAPILLARY: 97 mg/dL (ref 70–99)
Glucose-Capillary: 100 mg/dL — ABNORMAL HIGH (ref 70–99)

## 2013-12-21 LAB — CBC WITH DIFFERENTIAL/PLATELET
BASOS PCT: 0 % (ref 0–1)
Basophils Absolute: 0 10*3/uL (ref 0.0–0.1)
EOS ABS: 0.1 10*3/uL (ref 0.0–0.7)
Eosinophils Relative: 2 % (ref 0–5)
HCT: 34.7 % — ABNORMAL LOW (ref 36.0–46.0)
Hemoglobin: 11 g/dL — ABNORMAL LOW (ref 12.0–15.0)
LYMPHS ABS: 1.6 10*3/uL (ref 0.7–4.0)
Lymphocytes Relative: 22 % (ref 12–46)
MCH: 27.3 pg (ref 26.0–34.0)
MCHC: 31.7 g/dL (ref 30.0–36.0)
MCV: 86.1 fL (ref 78.0–100.0)
Monocytes Absolute: 0.6 10*3/uL (ref 0.1–1.0)
Monocytes Relative: 8 % (ref 3–12)
NEUTROS PCT: 68 % (ref 43–77)
Neutro Abs: 5 10*3/uL (ref 1.7–7.7)
PLATELETS: 183 10*3/uL (ref 150–400)
RBC: 4.03 MIL/uL (ref 3.87–5.11)
RDW: 16.1 % — ABNORMAL HIGH (ref 11.5–15.5)
WBC: 7.4 10*3/uL (ref 4.0–10.5)

## 2013-12-21 LAB — COMPREHENSIVE METABOLIC PANEL
ALBUMIN: 2.6 g/dL — AB (ref 3.5–5.2)
ALK PHOS: 71 U/L (ref 39–117)
ALT: 11 U/L (ref 0–35)
AST: 12 U/L (ref 0–37)
BUN: 15 mg/dL (ref 6–23)
CO2: 23 mEq/L (ref 19–32)
Calcium: 9.9 mg/dL (ref 8.4–10.5)
Chloride: 103 mEq/L (ref 96–112)
Creatinine, Ser: 0.71 mg/dL (ref 0.50–1.10)
GFR calc Af Amer: 88 mL/min — ABNORMAL LOW (ref >90)
GFR calc non Af Amer: 76 mL/min — ABNORMAL LOW (ref >90)
GLUCOSE: 98 mg/dL (ref 70–99)
POTASSIUM: 4.2 meq/L (ref 3.7–5.3)
Sodium: 140 mEq/L (ref 137–147)
TOTAL PROTEIN: 6.3 g/dL (ref 6.0–8.3)
Total Bilirubin: 0.3 mg/dL (ref 0.3–1.2)

## 2013-12-21 LAB — URINE MICROSCOPIC-ADD ON

## 2013-12-21 LAB — URINALYSIS, ROUTINE W REFLEX MICROSCOPIC
BILIRUBIN URINE: NEGATIVE
Glucose, UA: NEGATIVE mg/dL
HGB URINE DIPSTICK: NEGATIVE
KETONES UR: NEGATIVE mg/dL
Nitrite: NEGATIVE
Protein, ur: NEGATIVE mg/dL
SPECIFIC GRAVITY, URINE: 1.022 (ref 1.005–1.030)
UROBILINOGEN UA: 0.2 mg/dL (ref 0.0–1.0)
pH: 6 (ref 5.0–8.0)

## 2013-12-21 LAB — I-STAT CG4 LACTIC ACID, ED: Lactic Acid, Venous: 1.43 mmol/L (ref 0.5–2.2)

## 2013-12-21 LAB — TROPONIN I: Troponin I: <0.3 ng/mL (ref <0.30)

## 2013-12-21 LAB — MRSA PCR SCREENING: MRSA BY PCR: NEGATIVE

## 2013-12-21 LAB — PRO B NATRIURETIC PEPTIDE: PRO B NATRI PEPTIDE: 146.3 pg/mL (ref 0–450)

## 2013-12-21 LAB — CBG MONITORING, ED: Glucose-Capillary: 93 mg/dL (ref 70–99)

## 2013-12-21 MED ORDER — ONDANSETRON HCL 4 MG/2ML IJ SOLN: 4.0000 mg | Freq: Four times a day (QID) | INTRAMUSCULAR | Status: DC | PRN

## 2013-12-21 MED ORDER — ENOXAPARIN SODIUM 40 MG/0.4ML ~~LOC~~ SOLN
40.0000 mg | SUBCUTANEOUS | Status: DC
  Administered 2013-12-21 – 2013-12-22 (×2): 40 mg via SUBCUTANEOUS
  Filled 2013-12-21 (×3): qty 0.4

## 2013-12-21 MED ORDER — DEXTROSE 5 % IV SOLN
1.0000 g | Freq: Once | INTRAVENOUS | Status: AC
  Administered 2013-12-21: 1 g via INTRAVENOUS
  Filled 2013-12-21: qty 10

## 2013-12-21 MED ORDER — OLOPATADINE HCL 0.1 % OP SOLN
1.0000 [drp] | Freq: Two times a day (BID) | OPHTHALMIC | Status: DC
  Administered 2013-12-21 – 2013-12-23 (×4): 1 [drp] via OPHTHALMIC
  Filled 2013-12-21: qty 5

## 2013-12-21 MED ORDER — BUDESONIDE 0.5 MG/2ML IN SUSP
0.5000 mg | Freq: Two times a day (BID) | RESPIRATORY_TRACT | Status: DC
  Administered 2013-12-21 – 2013-12-23 (×4): 0.5 mg via RESPIRATORY_TRACT
  Filled 2013-12-21 (×6): qty 2

## 2013-12-21 MED ORDER — LIDOCAINE 5 % EX PTCH
1.0000 | MEDICATED_PATCH | CUTANEOUS | Status: DC
  Administered 2013-12-21 – 2013-12-22 (×2): 1 via TRANSDERMAL
  Filled 2013-12-21 (×3): qty 1

## 2013-12-21 MED ORDER — SODIUM CHLORIDE 0.9 % IV BOLUS (SEPSIS)
500.0000 mL | Freq: Once | INTRAVENOUS | Status: AC
  Administered 2013-12-21: 500 mL via INTRAVENOUS

## 2013-12-21 MED ORDER — IPRATROPIUM-ALBUTEROL 0.5-2.5 (3) MG/3ML IN SOLN
3.0000 mL | Freq: Four times a day (QID) | RESPIRATORY_TRACT | Status: DC | PRN
  Administered 2013-12-22: 3 mL via RESPIRATORY_TRACT
  Filled 2013-12-21: qty 3

## 2013-12-21 MED ORDER — IOHEXOL 300 MG/ML  SOLN
80.0000 mL | Freq: Once | INTRAMUSCULAR | Status: AC | PRN
  Administered 2013-12-21: 80 mL via INTRAVENOUS

## 2013-12-21 MED ORDER — INSULIN ASPART 100 UNIT/ML ~~LOC~~ SOLN: 0.0000 [IU] | SUBCUTANEOUS | Status: DC

## 2013-12-21 MED ORDER — DEXTROSE 5 % IV SOLN
1.0000 g | INTRAVENOUS | Status: DC
  Administered 2013-12-22: 1 g via INTRAVENOUS
  Filled 2013-12-21 (×2): qty 10

## 2013-12-21 MED ORDER — ACETAMINOPHEN 650 MG RE SUPP
650.0000 mg | Freq: Four times a day (QID) | RECTAL | Status: DC | PRN
  Administered 2013-12-22: 650 mg via RECTAL
  Filled 2013-12-21: qty 1

## 2013-12-21 MED ORDER — ASPIRIN 300 MG RE SUPP
300.0000 mg | Freq: Every day | RECTAL | Status: DC
  Administered 2013-12-21 – 2013-12-23 (×3): 300 mg via RECTAL
  Filled 2013-12-21 (×3): qty 1

## 2013-12-21 MED ORDER — ALBUTEROL SULFATE (2.5 MG/3ML) 0.083% IN NEBU
2.5000 mg | INHALATION_SOLUTION | Freq: Three times a day (TID) | RESPIRATORY_TRACT | Status: DC
  Administered 2013-12-21 – 2013-12-23 (×5): 2.5 mg via RESPIRATORY_TRACT
  Filled 2013-12-21 (×5): qty 3

## 2013-12-21 MED ORDER — CYCLOSPORINE 0.05 % OP EMUL
1.0000 [drp] | Freq: Two times a day (BID) | OPHTHALMIC | Status: DC
  Administered 2013-12-21 – 2013-12-23 (×4): 1 [drp] via OPHTHALMIC
  Filled 2013-12-21 (×5): qty 1

## 2013-12-21 MED ORDER — PANTOPRAZOLE SODIUM 40 MG IV SOLR
40.0000 mg | INTRAVENOUS | Status: DC
  Administered 2013-12-21 – 2013-12-22 (×2): 40 mg via INTRAVENOUS
  Filled 2013-12-21 (×3): qty 40

## 2013-12-21 MED ORDER — IOHEXOL 300 MG/ML  SOLN: 25.0000 mL | Freq: Once | INTRAMUSCULAR | Status: DC | PRN

## 2013-12-21 MED ORDER — FLUTICASONE PROPIONATE 50 MCG/ACT NA SUSP
2.0000 | Freq: Every day | NASAL | Status: DC
  Administered 2013-12-21 – 2013-12-23 (×3): 2 via NASAL
  Filled 2013-12-21: qty 16

## 2013-12-21 MED ORDER — DEXTROSE-NACL 5-0.9 % IV SOLN
INTRAVENOUS | Status: DC
  Administered 2013-12-21: 60 mL/h via INTRAVENOUS

## 2013-12-21 NOTE — ED Notes (Signed)
Pt remains in CT

## 2013-12-21 NOTE — ED Notes (Signed)
CBG 93 

## 2013-12-21 NOTE — ED Notes (Signed)
Per family pt recent PNA; pt baseline alert to person and place able to call family; mostly able to orient to time

## 2013-12-21 NOTE — ED Provider Notes (Signed)
CSN: [id number]     Arrival date & time 12/21/13  1007 History   First MD Initiated Contact with Patient 12/21/13 1010     Chief Complaint  Patient presents with  . 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 The daughter states that the patient has been more disoriented over the last day and a half. She states typically the patient is alert and oriented to self, time and situation. The patient has been treated for pneumonia and urinary tract infection over the last several weeks with IM Rocephin. The patient has had urinary retention over the last day and a half. The daughter says the patient has also had nonbloody diarrhea, but she is quite evaluated for an outpatient CT scan today but decided that the patient was doing well to continue on oxygen transferred to the hospital. Patient is a DNR.    Past Medical History  Diagnosis Date  . Anemia   . Arthritis   . COPD (chronic obstructive pulmonary disease)   . Chronic schizophrenia   . Dementia   . Depressive disorder   . DM (diabetes mellitus)   . Stroke   . Acute sinusitis   . Breast cancer   . Schizophrenia    Past Surgical History  Procedure Laterality Date  . Appendectomy    . Breast surgery      breast cancer  . Left knee replacement     Family History  Problem Relation Age of Onset  . Asthma Daughter   . Heart disease Sister     3 sisters  . Heart disease Brother     x 2  . Cancer Brother     multiple myloma   History  Substance Use Topics  . Smoking status: Former Smoker -- 1.00 packs/day for 15 years     Types: Cigarettes    Quit date: 09/17/2003  . Smokeless tobacco: Not on file  . Alcohol Use: No   OB History   Grav Para Term Preterm Abortions TAB SAB Ect Mult Living                 Review of Systems  Unable to perform ROS: Mental status change      Allergies  Review of patient's allergies indicates no known allergies.  Home Medications   Current Outpatient Rx  Name  Route  Sig  Dispense  Refill  . acetaminophen (TYLENOL) 500 MG tablet   Oral   Take 500 mg by mouth 2 (two) times daily.         Marland Kitchen albuterol (PROVENTIL) (2.5 MG/3ML) 0.083% nebulizer solution   Nebulization   Take 2.5 mg by nebulization 3 (three) times daily.         Marland Kitchen aspirin 325 MG tablet   Oral   Take 325 mg by mouth daily.         . bisacodyl (DULCOLAX) 10 MG suppository   Rectal   Place 10 mg rectally once.         . budesonide (PULMICORT) 0.5 MG/2ML nebulizer solution   Nebulization   Take 0.5 mg by nebulization 2 (two) times daily.         . cefTRIAXone (ROCEPHIN) 1 G injection  Intramuscular   Inject 1 g into the muscle daily. For 6 days         . cetirizine (ZYRTEC) 10 MG tablet   Oral   Take 10 mg by mouth daily.         . cholecalciferol (VITAMIN D) 1000 UNITS tablet   Oral   Take 2,000 Units by mouth daily.         . cycloSPORINE (RESTASIS) 0.05 % ophthalmic emulsion   Both Eyes   Place 1 drop into both eyes 2 (two) times daily.          Marland Kitchen docusate sodium (COLACE) 100 MG capsule   Oral   Take 100 mg by mouth 2 (two) times daily.          Marland Kitchen donepezil (ARICEPT) 10 MG tablet   Oral   Take 10 mg by mouth at bedtime.          Marland Kitchen FLUPHENAZINE DECANOATE IJ   Injection   Inject 25 mg/mL as directed every 30 (thirty) days. Next shot due 01/02/14         . fluticasone (FLONASE) 50 MCG/ACT nasal spray   Nasal   Place 2 sprays into the nose daily.         Marland Kitchen gabapentin (NEURONTIN) 400 MG capsule   Oral   Take 400 mg by mouth 3 (three) times daily.          Marland Kitchen HYDROcodone-acetaminophen (NORCO/VICODIN) 5-325 MG per tablet   Oral   Take 0.5 tablets by mouth every 6 (six) hours as needed for moderate pain.         Marland Kitchen ipratropium-albuterol (DUONEB) 0.5-2.5 (3) MG/3ML SOLN   Nebulization   Take 3 mLs by nebulization every 6 (six) hours as needed (shortness of breath).          . lidocaine (LIDODERM) 5 %   Transdermal   Place 1 patch onto the skin daily. 12 hours on 12 hours off         . metFORMIN (GLUCOPHAGE) 500 MG tablet   Oral   Take 500 mg by mouth daily.          Marland Kitchen olopatadine (PATANOL) 0.1 % ophthalmic solution   Both Eyes   Place 1 drop into both eyes 2 (two) times daily.          Marland Kitchen omeprazole (PRILOSEC) 40 MG capsule   Oral   Take 40 mg by mouth 2 (two) times daily.         . polyethylene glycol (MIRALAX / GLYCOLAX) packet   Oral   Take 17 g by mouth daily.         . pravastatin (PRAVACHOL) 40 MG tablet   Oral   Take 40 mg by mouth daily.         . sertraline (ZOLOFT) 50 MG tablet   Oral   Take 50 mg by mouth daily.         . fluconazole (DIFLUCAN) 100 MG tablet   Oral   Take 100 mg by mouth daily. For 2 days          BP 147/77  Pulse 67  Temp(Src) 97 F (36.1 C) (Rectal)  Resp 14  SpO2 99% Physical Exam  Nursing note and vitals reviewed. Constitutional: She appears well-developed and well-nourished.  HENT:  Head: Normocephalic and atraumatic.  Right Ear: External ear normal.  Left Ear: External ear normal.  Nose: Nose normal.  Eyes: Conjunctivae are normal. Pupils are  equal, round, and reactive to light.  Neck: Normal range of motion. Neck supple.  Cardiovascular: Normal rate, regular rhythm, normal heart sounds and intact distal pulses.   Pulmonary/Chest: Effort normal. No accessory muscle usage. No respiratory distress. She has decreased breath sounds.  Abdominal: Soft. Bowel sounds are normal. She exhibits no distension. There is tenderness in the right lower quadrant and  suprapubic area. There is no rigidity, no rebound, no guarding and no CVA tenderness.  Musculoskeletal: Normal range of motion. She exhibits no edema.  Lymphadenopathy:    She has no cervical adenopathy.  Neurological: She is alert.  Oriented to self  Skin: Skin is warm and dry.  Psychiatric: She has a normal mood and affect.    ED Course  Procedures (including critical care time) Medications  iohexol (OMNIPAQUE) 300 MG/ML solution 25 mL (not administered)  cefTRIAXone (ROCEPHIN) 1 g in dextrose 5 % 50 mL IVPB (1 g Intravenous New Bag/Given 12/21/13 1542)  sodium chloride 0.9 % bolus 500 mL (0 mLs Intravenous Stopped 12/21/13 1426)  iohexol (OMNIPAQUE) 300 MG/ML solution 80 mL (80 mLs Intravenous Contrast Given 12/21/13 1403)    Labs Review Labs Reviewed  URINALYSIS, ROUTINE W REFLEX MICROSCOPIC - Abnormal; Notable for the following:    Leukocytes, UA SMALL (*)    All other components within normal limits  CBC WITH DIFFERENTIAL - Abnormal; Notable for the following:    Hemoglobin 11.0 (*)    HCT 34.7 (*)    RDW 16.1 (*)    All other components within normal limits  COMPREHENSIVE METABOLIC PANEL - Abnormal; Notable for the following:    Albumin 2.6 (*)    GFR calc non Af Amer 76 (*)    GFR calc Af Amer 88 (*)    All other components within normal limits  URINE MICROSCOPIC-ADD ON - Abnormal; Notable for the following:    Squamous Epithelial / LPF FEW (*)    Bacteria, UA MANY (*)    Casts HYALINE CASTS (*)    All other components within normal limits  URINE CULTURE  TROPONIN I  PRO B NATRIURETIC PEPTIDE  CBG MONITORING, ED  I-STAT CG4 LACTIC ACID, ED   Imaging Review Ct Head Wo Contrast  12/21/2013   CLINICAL DATA:  from nursing home due to increased lethargy, history of urinary tract infection  EXAM: CT HEAD WITHOUT CONTRAST  TECHNIQUE: Contiguous axial images were obtained from the base of the skull through the vertex without intravenous contrast.  COMPARISON:  None.  FINDINGS:  Moderate diffuse atrophy. Moderate to severe low attenuation in the deep periventricular white matter. Evidence of chronic lacunar infarction in the region of the right basal ganglia. No evidence of vascular territory infarct, hemorrhage, or extra-axial fluid. Calvarium is intact.  IMPRESSION: Chronic age-related involutional findings.  No acute abnormality.   Electronically Signed   By: Skipper Cliche M.D.   On: 12/21/2013 14:22   Ct Abdomen Pelvis W Contrast  12/21/2013   CLINICAL DATA:  Fatigue.  Increased lethargy.  EXAM: CT ABDOMEN AND PELVIS WITH CONTRAST  TECHNIQUE: Multidetector CT imaging of the abdomen and pelvis was performed using the standard protocol following bolus administration of intravenous contrast.  CONTRAST:  87mL OMNIPAQUE IOHEXOL 300 MG/ML  SOLN  COMPARISON:  None.  FINDINGS: Small bilateral effusions and atelectasis are noted. The heart is mildly enlarged. There is no significant pericardial effusion.  The liver and spleen are within normal limits. There is some artifact from patient arm positioning. The stomach and  duodenum are within normal limits. The pancreas is unremarkable. Common bile duct and gallbladder are within normal limits. Adrenal glands are normal bilaterally. Bilateral renal cysts are present, largest at the upper pole of the left kidney measuring 11.7 x 10.2 cm. Atherosclerotic calcifications are present in the abdominal aorta.  The rectosigmoid colon is within normal limits. The remainder the colon is unremarkable. The appendix is not visualized and may be surgically absent. The small bowel is unremarkable. No significant adenopathy or free fluid is present. A Foley catheter is present within the urinary bladder. The uterus and adnexa are normal for age.  There is ankylosis across the disc space at L5-S1. Multilevel facet degenerative changes are evident. No focal lytic or blastic lesions are present.  IMPRESSION: 1. Bilateral pleural effusions and atelectasis. 2.  Prominent bilateral renal cysts, left greater than right. 3. No acute or focal lesion to explain the patient's symptoms.   Electronically Signed   By: Lawrence Santiago M.D.   On: 12/21/2013 14:32   Dg Chest Portable 1 View  12/21/2013   CLINICAL DATA:  History of pneumonia and previous CVA  EXAM: PORTABLE CHEST - 1 VIEW  COMPARISON:  DG CHEST 1 VIEW dated 05/13/2012  FINDINGS: The lungs are adequately inflated. There are coarse lung markings in the left lateral costophrenic angle which are not new and may reflect atelectasis or scarring. The cardiopericardial silhouette is enlarged but stable. The pulmonary vascularity is not engorged. There is mild tortuosity of the descending thoracic aorta. The observed portions of the bony thorax exhibit no acute abnormalities.  IMPRESSION: The findings are consistent with COPD and likely low-grade compensated CHF. There is no evidence of pneumonia. Atelectasis versus scarring at the left lung base is suspected. A followup PA and lateral chest x-ray would be of value when the patient can tolerate the procedure.   Electronically Signed   By: David  Martinique   On: 12/21/2013 11:48     EKG Interpretation None      MDM   Final diagnoses:  Altered mental status    Filed Vitals:   12/21/13 1500  BP: 147/77  Pulse: 67  Temp:   Resp: 14   Afebrile, NAD, non-toxic appearing, AAOx1. Vital signs stable. Patient with altered mental status. I have reviewed nursing notes, vital signs, and all appropriate lab and imaging results for this patient. Does not meet any Sepsis/SIRS criteria. Chest x-ray, CT scans reviewed. UA looks like continued urinary tract infection. Could be possible source of mental status change. Will amit patient for further evaluation and management. IV Rocephin given. IV fluids given. Patient d/w with Dr. Regenia Skeeter, agrees with plan.          Harlow Mares, PA-C 12/21/13 1610

## 2013-12-21 NOTE — ED Notes (Signed)
Patient transported to CT 

## 2013-12-21 NOTE — ED Notes (Signed)
Pt resting comfortable but easily awakened with verbal stimulation.

## 2013-12-21 NOTE — ED Notes (Signed)
Per EMS: pt from SNF [facility] c/o increased lethargy; pt alert to person only unsure of baseline; pt with recent treatment for UTI with rocephin and had foley placed last night due to possible retention

## 2013-12-21 NOTE — Progress Notes (Signed)
[name] [id number] Admission Data: 12/21/2013 7:32 PM Attending Provider: Modena Jansky, MD  XBM:[id number], MD Consults/ Treatment Team:    [name] is a 80 y.o. female patient admitted from ED awake, alert  & orientated  X 2,  Full Code, VSS - Blood pressure 156/74, pulse 67, temperature 97.5 F (36.4 C), temperature source Oral, resp. rate 20, height 5\' 9"  (1.753 m), weight 94.212 kg (207 lb 11.2 oz), SpO2 99.00%., O2    3 L nasal cannular, no c/o shortness of breath, no c/o chest pain, no distress noted.    IV site WDL:  hand right, condition patent and no redness with a transparent dsg that's clean dry and intact.  Allergies:  No Known Allergies   Past Medical History  Diagnosis Date  . Anemia   . Arthritis   . COPD (chronic obstructive pulmonary disease)   . Chronic schizophrenia   . Dementia   . Depressive disorder   . DM (diabetes mellitus)   . Stroke   . Acute sinusitis   . Breast cancer   . Schizophrenia   . Osteoporosis   . Stenosis of lumbosacral spine     Pt orientation to unit, room and routine. Information packet given to patient/family and safety video watched.  Admission INP armband ID verified with patient/family, and in place. SR up x 2, fall risk assessment complete with Patient and family verbalizing understanding of risks associated with falls. Pt verbalizes an understanding of how to use the call bell and to call for help before getting out of bed.  Skin, clean-dry- intact without evidence of bruising, or skin tears.  MAD on sacral region, foam dressing placed. No evidence of skin break down noted on exam. no rashes    Will cont to monitor and assist as needed.  Tuesday Terlecki, Elissa Hefty, RN 12/21/2013 7:32 PM

## 2013-12-21 NOTE — H&P (Signed)
History and Physical  [name] ZWC:[id number] DOB: 07/03/1926 DOA: 12/21/2013  Referring physician: EDP PCP: Wenda Low, MD  Outpatient Specialists:  1. Pulmonology: Dr. Kara Mead  Chief Complaint: Altered mental status  HPI: [name] is a 80 y.o. female , resident of [facility], wheelchair mobile, mentally sharp at baseline-able to play bingo, COPD-not oxygen dependent, CVA with residual left hemiparesis, schizophrenia/dementia/depressive disorder, type II DM, anemia, was transferred from the SNF secondary to altered mental status. Patient unable to provide any history secondary to mental status changes. History obtained from patient's 2 daughters at bedside. 4 weeks ago, patient had similar episode of less responsiveness and dysuria. She was treated with few days of IM Rocephin with prompt response. 3 weeks ago, she had several episodes of diarrhea for a few days which was attributed to fecal impaction/adynamic ileus-was in the process of being worked up with repeat CT scan that was due for today. Diarrhea has resolved and patient has not had a BM since Thursday of last week. 4 days back, she again had less responsiveness and was given a dose of IM Rocephin and a dose of IM Solu-Medrol for COPD and wheezing. The next day she was much more coherent. However since yesterday, she has again been less responsive, poor oral intake. No reported fever, chills, cough, pain, dyspnea, nausea, vomiting, dysuria, rash or open wounds. She has had issues with urinary retention for which she was getting in and out cath and finally a Foley catheter was placed yesterday. As per daughters, poor GU hygiene and patient at times may be sitting in puddles of urine/stools for extended period of time. In the ED, hemoglobin 11, CT head without acute findings, chest x-ray negative for pneumonia, CT abdomen and pelvis show bilateral pleural effusions and atelectasis but no acute findings and UA +. She has  received a dose of IV Rocephin and hospitalist admission requested. Discussed briefly with NP taking care of patient at the nursing facility who stated that her urine culture is growing Streptococcus viridans-sensitivity pending.   Review of Systems: All systems reviewed and apart from history of presenting illness, are negative. Patient has occasional brief jerking movements of extremities-seem myoclonic by history. Also history of dysphagia, recently evaluated by speech therapy at SNF and placed on mechanical soft diet.  Past Medical History  Diagnosis Date  . Anemia   . Arthritis   . COPD (chronic obstructive pulmonary disease)   . Chronic schizophrenia   . Dementia   . Depressive disorder   . DM (diabetes mellitus)   . Stroke   . Acute sinusitis   . Breast cancer   . Schizophrenia    Past Surgical History  Procedure Laterality Date  . Appendectomy    . Breast surgery      breast cancer  . Left knee replacement     Social History:  reports that she quit smoking about 10 years ago. Her smoking use included Cigarettes. She has a 15 pack-year smoking history. She does not have any smokeless tobacco history on file. She reports that she does not drink alcohol or use illicit drugs. Nursing home resident. Wheelchair mobile.  No Known Allergies  Family History  Problem Relation Age of Onset  . Asthma Daughter   . Heart disease Sister     3 sisters  . Heart disease Brother     x 2  . Cancer Brother     multiple myloma    Prior to Admission medications  Medication Sig Start Date End Date Taking? Authorizing Provider  acetaminophen (TYLENOL) 500 MG tablet Take 500 mg by mouth 2 (two) times daily.   Yes Historical Provider, MD  albuterol (PROVENTIL) (2.5 MG/3ML) 0.083% nebulizer solution Take 2.5 mg by nebulization 3 (three) times daily.   Yes Historical Provider, MD  aspirin 325 MG tablet Take 325 mg by mouth daily.   Yes Historical Provider, MD  bisacodyl (DULCOLAX) 10 MG  suppository Place 10 mg rectally once.   Yes Historical Provider, MD  budesonide (PULMICORT) 0.5 MG/2ML nebulizer solution Take 0.5 mg by nebulization 2 (two) times daily.   Yes Historical Provider, MD  cefTRIAXone (ROCEPHIN) 1 G injection Inject 1 g into the muscle daily. For 6 days 12/17/13  Yes Historical Provider, MD  cetirizine (ZYRTEC) 10 MG tablet Take 10 mg by mouth daily.   Yes Historical Provider, MD  cholecalciferol (VITAMIN D) 1000 UNITS tablet Take 2,000 Units by mouth daily.   Yes Historical Provider, MD  cycloSPORINE (RESTASIS) 0.05 % ophthalmic emulsion Place 1 drop into both eyes 2 (two) times daily.    Yes Historical Provider, MD  docusate sodium (COLACE) 100 MG capsule Take 100 mg by mouth 2 (two) times daily.    Yes Historical Provider, MD  donepezil (ARICEPT) 10 MG tablet Take 10 mg by mouth at bedtime.    Yes Historical Provider, MD  FLUPHENAZINE DECANOATE IJ Inject 25 mg/mL as directed every 30 (thirty) days. Next shot due 01/02/14   Yes Historical Provider, MD  fluticasone (FLONASE) 50 MCG/ACT nasal spray Place 2 sprays into the nose daily.   Yes Historical Provider, MD  gabapentin (NEURONTIN) 400 MG capsule Take 400 mg by mouth 3 (three) times daily.   Yes Historical Provider, MD  HYDROcodone-acetaminophen (NORCO/VICODIN) 5-325 MG per tablet Take 0.5 tablets by mouth every 6 (six) hours as needed for moderate pain.   Yes Historical Provider, MD  ipratropium-albuterol (DUONEB) 0.5-2.5 (3) MG/3ML SOLN Take 3 mLs by nebulization every 6 (six) hours as needed (shortness of breath).    Yes Historical Provider, MD  lidocaine (LIDODERM) 5 % Place 1 patch onto the skin daily. 12 hours on 12 hours off   Yes Historical Provider, MD  metFORMIN (GLUCOPHAGE) 500 MG tablet Take 500 mg by mouth daily.    Yes Historical Provider, MD  olopatadine (PATANOL) 0.1 % ophthalmic solution Place 1 drop into both eyes 2 (two) times daily.    Yes Historical Provider, MD  omeprazole (PRILOSEC) 40 MG  capsule Take 40 mg by mouth 2 (two) times daily.   Yes Historical Provider, MD  polyethylene glycol (MIRALAX / GLYCOLAX) packet Take 17 g by mouth daily.   Yes Historical Provider, MD  pravastatin (PRAVACHOL) 40 MG tablet Take 40 mg by mouth daily.   Yes Historical Provider, MD  sertraline (ZOLOFT) 50 MG tablet Take 50 mg by mouth daily.   Yes Historical Provider, MD   Physical Exam: Filed Vitals:   12/21/13 1245 12/21/13 1426 12/21/13 1500 12/21/13 1600  BP: 128/74 131/74 147/77 149/76  Pulse: 64 67 67 64  Temp:      TempSrc:      Resp: 12 18 14 17   SpO2: 98% 99% 99% 99%     General exam: Moderately built and nourished female patient, lying comfortably supine on the gurney in no obvious distress. Does not look septic or toxic.  Head, eyes and ENT: Nontraumatic and normocephalic. Pupils equally reacting to light and accommodation. Oral mucosa dry.  Neck: Supple.  No JVD, carotid bruit or thyromegaly.  Lymphatics: No lymphadenopathy.  Respiratory system: Poor inspiratory effort but seems clear to auscultation. No increased work of breathing.  Cardiovascular system: S1 and S2 heard, RRR. No JVD, murmurs, gallops, clicks or pedal edema.  Gastrointestinal system: Abdomen is nondistended, soft and nontender. Normal bowel sounds heard. No organomegaly or masses appreciated. Foley catheter +  Central nervous system: Drowsy but easily arousable. Oriented to self only. Follow some instructions. No focal neurological deficits.  Extremities: Contracture of left upper extremity. At least 4 x 5 power in right upper extremity and 2 x 5 power in bilateral lower extremities. Peripheral pulses symmetrically felt.   Skin: No rashes or acute findings.  Musculoskeletal system: Negative exam.  Psychiatry: Pleasant and cooperative.   Labs on Admission:  Basic Metabolic Panel:  Recent Labs Lab 12/21/13 1032  NA 140  K 4.2  CL 103  CO2 23  GLUCOSE 98  BUN 15  CREATININE 0.71  CALCIUM  9.9   Liver Function Tests:  Recent Labs Lab 12/21/13 1032  AST 12  ALT 11  ALKPHOS 71  BILITOT 0.3  PROT 6.3  ALBUMIN 2.6*   No results found for this basename: LIPASE, AMYLASE,  in the last 168 hours No results found for this basename: AMMONIA,  in the last 168 hours CBC:  Recent Labs Lab 12/21/13 1032  WBC 7.4  NEUTROABS 5.0  HGB 11.0*  HCT 34.7*  MCV 86.1  PLT 183   Cardiac Enzymes:  Recent Labs Lab 12/21/13 1033  TROPONINI <0.30    BNP (last 3 results) No results found for this basename: PROBNP,  in the last 8760 hours CBG:  Recent Labs Lab 12/21/13 1127  GLUCAP 93    Radiological Exams on Admission: Ct Head Wo Contrast  12/21/2013   CLINICAL DATA:  from nursing home due to increased lethargy, history of urinary tract infection  EXAM: CT HEAD WITHOUT CONTRAST  TECHNIQUE: Contiguous axial images were obtained from the base of the skull through the vertex without intravenous contrast.  COMPARISON:  None.  FINDINGS: Moderate diffuse atrophy. Moderate to severe low attenuation in the deep periventricular white matter. Evidence of chronic lacunar infarction in the region of the right basal ganglia. No evidence of vascular territory infarct, hemorrhage, or extra-axial fluid. Calvarium is intact.  IMPRESSION: Chronic age-related involutional findings.  No acute abnormality.   Electronically Signed   By: Skipper Cliche M.D.   On: 12/21/2013 14:22   Ct Abdomen Pelvis W Contrast  12/21/2013   CLINICAL DATA:  Fatigue.  Increased lethargy.  EXAM: CT ABDOMEN AND PELVIS WITH CONTRAST  TECHNIQUE: Multidetector CT imaging of the abdomen and pelvis was performed using the standard protocol following bolus administration of intravenous contrast.  CONTRAST:  58mL OMNIPAQUE IOHEXOL 300 MG/ML  SOLN  COMPARISON:  None.  FINDINGS: Small bilateral effusions and atelectasis are noted. The heart is mildly enlarged. There is no significant pericardial effusion.  The liver and spleen are  within normal limits. There is some artifact from patient arm positioning. The stomach and duodenum are within normal limits. The pancreas is unremarkable. Common bile duct and gallbladder are within normal limits. Adrenal glands are normal bilaterally. Bilateral renal cysts are present, largest at the upper pole of the left kidney measuring 11.7 x 10.2 cm. Atherosclerotic calcifications are present in the abdominal aorta.  The rectosigmoid colon is within normal limits. The remainder the colon is unremarkable. The appendix is not visualized and may be surgically absent.  The small bowel is unremarkable. No significant adenopathy or free fluid is present. A Foley catheter is present within the urinary bladder. The uterus and adnexa are normal for age.  There is ankylosis across the disc space at L5-S1. Multilevel facet degenerative changes are evident. No focal lytic or blastic lesions are present.  IMPRESSION: 1. Bilateral pleural effusions and atelectasis. 2. Prominent bilateral renal cysts, left greater than right. 3. No acute or focal lesion to explain the patient's symptoms.   Electronically Signed   By: Lawrence Santiago M.D.   On: 12/21/2013 14:32   Dg Chest Portable 1 View  12/21/2013   CLINICAL DATA:  History of pneumonia and previous CVA  EXAM: PORTABLE CHEST - 1 VIEW  COMPARISON:  DG CHEST 1 VIEW dated 05/13/2012  FINDINGS: The lungs are adequately inflated. There are coarse lung markings in the left lateral costophrenic angle which are not new and may reflect atelectasis or scarring. The cardiopericardial silhouette is enlarged but stable. The pulmonary vascularity is not engorged. There is mild tortuosity of the descending thoracic aorta. The observed portions of the bony thorax exhibit no acute abnormalities.  IMPRESSION: The findings are consistent with COPD and likely low-grade compensated CHF. There is no evidence of pneumonia. Atelectasis versus scarring at the left lung base is suspected. A followup  PA and lateral chest x-ray would be of value when the patient can tolerate the procedure.   Electronically Signed   By: David  Martinique   On: 12/21/2013 11:48    EKG: Independently reviewed. Sinus rhythm, normal axis without acute findings. What is reported as ST elevation seems to be elevated J point.  Assessment/Plan Principal Problem:   UTI (lower urinary tract infection) Active Problems:   COPD (chronic obstructive pulmonary disease)   Acute encephalopathy   Anemia   Chronic schizophrenia   Dementia   Depressive disorder   DM (diabetes mellitus)   Stroke   Dehydration   Urinary retention   1. UTI: Likely Streptococcus viridans. Continue IV Rocephin pending sensitivity results. Will had to focus on local hygiene. 2. Acute encephalopathy: Secondary to acute medical illness including UTI and mild dehydration, complicating underlying dementia and psychiatric illness. Treat underlying etiology and follow clinically. CT head without acute findings. N.p.o. until alert and oriented. 3. Mild dehydration: Evidenced by poor oral intake, concentrated-appearing urine and dry mucosa. Although her chest x-ray has been reported as mild CHF, patient is clinically dry. Brief IV fluid hydration. 4. Anemia: Seems chronic. Follow CBC in a.m. 5. COPD: Stable. Continue bronchodilators. 6. Chronic schizophrenia/dementia/depressive disorder: Hold medications until alert. 7. History of CVA and residual left hemiparesis: CT head without acute findings. 8. History of urinary retention: Foley catheter recently placed. 9. Type II DM: Hold oral medications. SSI. 10. Dysphagia: Speech therapy evaluation when patient is alert.     Code Status: Full  Family Communication: Discussed the patient's 2 daughters Ms. Sharone Vroom-Dean in the Ms. Verneda Skill at bedside. Updated care and answered questions Disposition Plan: SNF when medically stable.   Time spent: 58 minutes  Indigo Barbian, MD, FACP, FHM. [facility] Pager [phone]  If 7PM-7AM, please contact night-coverage [email or web address] Password [email or web address] 12/21/2013, 5:05 PM

## 2013-12-21 NOTE — ED Notes (Signed)
Lactic acid results called to primary nurse Suezanne Jacquet

## 2013-12-21 NOTE — ED Notes (Signed)
Back from CT

## 2013-12-22 DIAGNOSIS — F0390 Unspecified dementia without behavioral disturbance: Secondary | ICD-10-CM

## 2013-12-22 DIAGNOSIS — D649 Anemia, unspecified: Secondary | ICD-10-CM

## 2013-12-22 DIAGNOSIS — F205 Residual schizophrenia: Secondary | ICD-10-CM

## 2013-12-22 DIAGNOSIS — R4182 Altered mental status, unspecified: Secondary | ICD-10-CM

## 2013-12-22 DIAGNOSIS — E119 Type 2 diabetes mellitus without complications: Secondary | ICD-10-CM

## 2013-12-22 LAB — GLUCOSE, CAPILLARY
GLUCOSE-CAPILLARY: 101 mg/dL — AB (ref 70–99)
GLUCOSE-CAPILLARY: 115 mg/dL — AB (ref 70–99)
GLUCOSE-CAPILLARY: 117 mg/dL — AB (ref 70–99)
Glucose-Capillary: 105 mg/dL — ABNORMAL HIGH (ref 70–99)
Glucose-Capillary: 103 mg/dL — ABNORMAL HIGH (ref 70–99)

## 2013-12-22 LAB — CBC
HCT: 34.7 % — ABNORMAL LOW (ref 36.0–46.0)
HEMOGLOBIN: 10.8 g/dL — AB (ref 12.0–15.0)
MCH: 26.9 pg (ref 26.0–34.0)
MCHC: 31.1 g/dL (ref 30.0–36.0)
MCV: 86.5 fL (ref 78.0–100.0)
Platelets: 170 10*3/uL (ref 150–400)
RBC: 4.01 MIL/uL (ref 3.87–5.11)
RDW: 15.9 % — ABNORMAL HIGH (ref 11.5–15.5)
WBC: 5.9 10*3/uL (ref 4.0–10.5)

## 2013-12-22 LAB — BASIC METABOLIC PANEL
BUN: 10 mg/dL (ref 6–23)
CHLORIDE: 103 meq/L (ref 96–112)
CO2: 23 mEq/L (ref 19–32)
Calcium: 9.7 mg/dL (ref 8.4–10.5)
Creatinine, Ser: 0.66 mg/dL (ref 0.50–1.10)
GFR, EST NON AFRICAN AMERICAN: 78 mL/min — AB (ref >90)
Glucose, Bld: 108 mg/dL — ABNORMAL HIGH (ref 70–99)
POTASSIUM: 3.9 meq/L (ref 3.7–5.3)
Sodium: 139 mEq/L (ref 137–147)

## 2013-12-22 MED ORDER — POLYETHYLENE GLYCOL 3350 17 G PO PACK
17.0000 g | PACK | Freq: Every day | ORAL | Status: DC
  Administered 2013-12-22 – 2013-12-23 (×2): 17 g via ORAL
  Filled 2013-12-22 (×2): qty 1

## 2013-12-22 MED ORDER — BIOTENE DRY MOUTH MT LIQD
15.0000 mL | Freq: Two times a day (BID) | OROMUCOSAL | Status: DC
Start: 2013-12-22 — End: 2013-12-23
  Administered 2013-12-22 – 2013-12-23 (×3): 15 mL via OROMUCOSAL

## 2013-12-22 NOTE — Evaluation (Signed)
Clinical/Bedside Swallow Evaluation Patient Details  Name: [name] MRN: [id number] Date of Birth: 1926-01-01  Today's Date: 12/22/2013 Time: 1130-1205 SLP Time Calculation (min): 35 min  Past Medical History:  Past Medical History  Diagnosis Date  . Anemia   . Arthritis   . COPD (chronic obstructive pulmonary disease)   . Chronic schizophrenia   . Dementia   . Depressive disorder   . DM (diabetes mellitus)   . Stroke   . Acute sinusitis   . Breast cancer   . Schizophrenia   . Osteoporosis   . Stenosis of lumbosacral spine    Past Surgical History:  Past Surgical History  Procedure Laterality Date  . Appendectomy    . Breast surgery      breast cancer  . Left knee replacement    . Cataract extraction, bilateral    . Cholecystectomy     HPI:  80 yo female adm. from SNF with MS change (decreased responsiveness and poor po intake).  PMHx significant for COPD, CVA with residual L hemiparesis, schizophrenia, dementia, depressive disorder, DM2, anemia. Work-up reveals UTI.  CT of head:  Chronic age-related involutional findings.  No acute abnormality.   Assessment / Plan / Recommendation Clinical Impression  Pt. appears to have normal swallow function with the exception of a slight cough (delayed) after large sips of thin liquids via straw.  There was no cough when patient takes small sips of thin liquids from cup.  Family friend at b/s insists pt. has had this cough for years, and attributes it to COPD.  CXR:  The findings are consistent with COPD and likely low-grade compensated CHF. There is no evidence of pneumonia. Atelectasis versus scarring at the left lung base is suspected.    Aspiration Risk  Mild    Diet Recommendation Dysphagia 3 (Mechanical Soft);Thin liquid   Liquid Administration via: Cup;No straw Medication Administration: Whole meds with puree Supervision: Patient able to self feed;Staff to assist with self feeding;Full supervision/cueing for compensatory  strategies Compensations: Slow rate;Small sips/bites;Check for pocketing Postural Changes and/or Swallow Maneuvers: Seated upright 90 degrees    Other  Recommendations Oral Care Recommendations: Oral care BID;Staff/trained caregiver to provide oral care Other Recommendations: Clarify dietary restrictions   Follow Up Recommendations  Skilled Nursing facility    Frequency and Duration min 2x/week  2 weeks   Pertinent Vitals/Pain Pt. Dehydrated; afebrile; LS diminished        Swallow Study Prior Functional Status       General HPI: 80 yo female adm. from SNF with MS change (decreased responsiveness and poor po intake).  PMHx significant for COPD, CVA with residual L hemiparesis, schizophrenia, dementia, depressive disorder, DM2, anemia. Work-up reveals UTI.  CT of head:  Chronic age-related involutional findings.  No acute abnormality. Type of Study: Bedside swallow evaluation Previous Swallow Assessment: none found Diet Prior to this Study: NPO Temperature Spikes Noted: No Respiratory Status: Room air History of Recent Intubation: No Behavior/Cognition: Alert;Cooperative;Pleasant mood;Requires cueing;Decreased sustained attention Oral Cavity - Dentition: Edentulous;Dentures, not available (Has not worn dentures for years) Self-Feeding Abilities: Able to feed self;Needs assist Patient Positioning: Upright in bed Baseline Vocal Quality: Clear Volitional Cough: Strong Volitional Swallow: Able to elicit    Oral/Motor/Sensory Function Overall Oral Motor/Sensory Function: Appears within functional limits for tasks assessed   Ice Chips Ice chips: Within functional limits Presentation: Spoon   Thin Liquid Thin Liquid: Impaired Presentation: Spoon;Cup;Straw Pharyngeal  Phase Impairments: Cough - Delayed (with straw)    Nectar  Thick Nectar Thick Liquid: Not tested   Honey Thick Honey Thick Liquid: Not tested   Puree Puree: Within functional limits Presentation: Spoon    Solid   GO    Solid: Within functional limits Presentation: Iris Pert 12/22/2013,12:06 PM

## 2013-12-22 NOTE — Progress Notes (Signed)
[facility] PROGRESS NOTE  [name] PIR:[id number] DOB: 1926-03-30 DOA: 12/21/2013 PCP: Wenda Low, MD  HPI/Subjective: is a 80 y.o. female , Being treated for altered mental status and recurrent UTI, She was admitted andStarted on rocephen 12/18/2013. She was alone this morning and could not give accurate response to date, time, and location or the reason she is here today. Patient reports no new complaints and denies andy chest pain, abdominal pain, numbness, GU pain, SOB, swelling Or lower extremity pain.   Assessment/Plan:  UTI:  -Urinalysis consistent with UTI. Started on Rocephin. -Continue IV Rocephin pending sensitivity results. Will have to focus on local hygiene.   Acute encephalopathy: Secondary to acute medical illness including UTI and mild dehydration, complicating underlying dementia and psychiatric illness. Treat underlying etiology and follow clinically. CT head without acute findings. N.p.o. until alert and oriented.   Mild dehydration: Evidenced by poor oral intake, concentrated-appearing urine and dry mucosa. Although her chest x-ray has been reported as mild CHF, patient is clinically dry. Brief IV fluid hydration.  Type II DM: Hold oral medications. SSI  Dysphagia: Speech therapy evaluation when patient is alert.  Anemia: Seems chronic. CBC in am showed h/h at 10.8 and 34.7, Will repeat CBC On 12/24/2103  Chronic schizophrenia/dementia/depressive disorder: Hold medications until alert.  History of CVA and residual left hemiparesis: CT head without acute findings.  History of urinary retention: Foley catheter placed 4/7/20115.    Code Status: Full Family Communication: Patients daughters were present 12/21/2013,  Not present 12/22/2013 at 8:00am Disposition Plan: SNF once stable   Consultants:  none  Procedures:  None   Antibiotics:  Rocehpin 1g IV, Q24h, given 12/21/13 at 1542  Objective: Filed Vitals:   12/22/13 0840  BP:   Pulse: 69  Temp:    Resp: 18    Intake/Output Summary (Last 24 hours) at 12/22/13 1008 Last data filed at 12/22/13 0607  Gross per 24 hour  Intake     32 ml  Output   1100 ml  Net  -1068 ml   Filed Weights   12/21/13 1828 12/22/13 0603  Weight: 94.212 kg (207 lb 11.2 oz) 92.67 kg (204 lb 4.8 oz)    Exam:  Physical Exam  Nursing note and vitals reviewed. Constitutional: She appears well-nourished. She appears lethargic. No distress.  HENT:  Head: Normocephalic and atraumatic.  Neck: No JVD present.  Cardiovascular: Normal rate, regular rhythm, normal heart sounds and intact distal pulses.  Exam reveals no gallop and no friction rub.   No murmur heard. Respiratory: Effort normal. No respiratory distress. She has decreased breath sounds. She exhibits no tenderness.  GI: Soft. Bowel sounds are normal. She exhibits no distension. There is no tenderness.  Neurological: She appears lethargic.  Skin: She is not diaphoretic.  Psychiatric: Her speech is delayed. She is slowed. Cognition and memory are impaired.    Data Reviewed: Basic Metabolic Panel:  Recent Labs Lab 12/21/13 1032 12/22/13 0701  NA 140 139  K 4.2 3.9  CL 103 103  CO2 23 23  GLUCOSE 98 108*  BUN 15 10  CREATININE 0.71 0.66  CALCIUM 9.9 9.7   Liver Function Tests:  Recent Labs Lab 12/21/13 1032  AST 12  ALT 11  ALKPHOS 71  BILITOT 0.3  PROT 6.3  ALBUMIN 2.6*   No results found for this basename: LIPASE, AMYLASE,  in the last 168 hours No results found for this basename: AMMONIA,  in the last 168 hours CBC:  Recent Labs  Lab 12/21/13 1032 12/22/13 0701  WBC 7.4 5.9  NEUTROABS 5.0  --   HGB 11.0* 10.8*  HCT 34.7* 34.7*  MCV 86.1 86.5  PLT 183 170   Cardiac Enzymes:  Recent Labs Lab 12/21/13 1033  TROPONINI <0.30   BNP (last 3 results)  Recent Labs  12/21/13 2106  PROBNP 146.3   CBG:  Recent Labs Lab 12/21/13 1826 12/21/13 1935 12/21/13 2350 12/22/13 0351 12/22/13 0826  GLUCAP 97 100*  104* 105* 101*    Recent Results (from the past 240 hour(s))  MRSA PCR SCREENING     Status: None   Collection Time    12/21/13  8:24 PM      Result Value Ref Range Status   MRSA by PCR NEGATIVE  NEGATIVE Final   Comment:            The GeneXpert MRSA Assay (FDA     approved for NASAL specimens     only), is one component of a     comprehensive MRSA colonization     surveillance program. It is not     intended to diagnose MRSA     infection nor to guide or     monitor treatment for     MRSA infections.     Studies: Ct Head Wo Contrast  12/21/2013   CLINICAL DATA:  from nursing home due to increased lethargy, history of urinary tract infection  EXAM: CT HEAD WITHOUT CONTRAST  TECHNIQUE: Contiguous axial images were obtained from the base of the skull through the vertex without intravenous contrast.  COMPARISON:  None.  FINDINGS: Moderate diffuse atrophy. Moderate to severe low attenuation in the deep periventricular white matter. Evidence of chronic lacunar infarction in the region of the right basal ganglia. No evidence of vascular territory infarct, hemorrhage, or extra-axial fluid. Calvarium is intact.  IMPRESSION: Chronic age-related involutional findings.  No acute abnormality.   Electronically Signed   By: Skipper Cliche M.D.   On: 12/21/2013 14:22   Ct Abdomen Pelvis W Contrast  12/21/2013   CLINICAL DATA:  Fatigue.  Increased lethargy.  EXAM: CT ABDOMEN AND PELVIS WITH CONTRAST  TECHNIQUE: Multidetector CT imaging of the abdomen and pelvis was performed using the standard protocol following bolus administration of intravenous contrast.  CONTRAST:  73mL OMNIPAQUE IOHEXOL 300 MG/ML  SOLN  COMPARISON:  None.  FINDINGS: Small bilateral effusions and atelectasis are noted. The heart is mildly enlarged. There is no significant pericardial effusion.  The liver and spleen are within normal limits. There is some artifact from patient arm positioning. The stomach and duodenum are within normal  limits. The pancreas is unremarkable. Common bile duct and gallbladder are within normal limits. Adrenal glands are normal bilaterally. Bilateral renal cysts are present, largest at the upper pole of the left kidney measuring 11.7 x 10.2 cm. Atherosclerotic calcifications are present in the abdominal aorta.  The rectosigmoid colon is within normal limits. The remainder the colon is unremarkable. The appendix is not visualized and may be surgically absent. The small bowel is unremarkable. No significant adenopathy or free fluid is present. A Foley catheter is present within the urinary bladder. The uterus and adnexa are normal for age.  There is ankylosis across the disc space at L5-S1. Multilevel facet degenerative changes are evident. No focal lytic or blastic lesions are present.  IMPRESSION: 1. Bilateral pleural effusions and atelectasis. 2. Prominent bilateral renal cysts, left greater than right. 3. No acute or focal lesion to explain the patient's  symptoms.   Electronically Signed   By: Lawrence Santiago M.D.   On: 12/21/2013 14:32   Dg Chest Portable 1 View  12/21/2013   CLINICAL DATA:  History of pneumonia and previous CVA  EXAM: PORTABLE CHEST - 1 VIEW  COMPARISON:  DG CHEST 1 VIEW dated 05/13/2012  FINDINGS: The lungs are adequately inflated. There are coarse lung markings in the left lateral costophrenic angle which are not new and may reflect atelectasis or scarring. The cardiopericardial silhouette is enlarged but stable. The pulmonary vascularity is not engorged. There is mild tortuosity of the descending thoracic aorta. The observed portions of the bony thorax exhibit no acute abnormalities.  IMPRESSION: The findings are consistent with COPD and likely low-grade compensated CHF. There is no evidence of pneumonia. Atelectasis versus scarring at the left lung base is suspected. A followup PA and lateral chest x-ray would be of value when the patient can tolerate the procedure.   Electronically Signed    By: David  Martinique   On: 12/21/2013 11:48    Scheduled Meds: . albuterol  2.5 mg Nebulization TID  . antiseptic oral rinse  15 mL Mouth Rinse BID  . aspirin  300 mg Rectal Daily  . budesonide  0.5 mg Nebulization BID  . cefTRIAXone (ROCEPHIN)  IV  1 g Intravenous Q24H  . cycloSPORINE  1 drop Both Eyes BID  . enoxaparin (LOVENOX) injection  40 mg Subcutaneous Q24H  . fluticasone  2 spray Each Nare Daily  . insulin aspart  0-9 Units Subcutaneous 6 times per day  . lidocaine  1 patch Transdermal Q24H  . olopatadine  1 drop Both Eyes BID  . pantoprazole (PROTONIX) IV  40 mg Intravenous Q24H   Continuous Infusions:   Principal Problem:   UTI (lower urinary tract infection) Active Problems:   COPD (chronic obstructive pulmonary disease)   Acute encephalopathy   Anemia   Chronic schizophrenia   Dementia   Depressive disorder   DM (diabetes mellitus)   Stroke   Dehydration   Urinary retention    Time spent: [place] PA-S  [facility]  If 7PM-7AM, please contact night-coverage at [email or web address], password [facility] 12/22/2013, 10:08 AM  LOS: 1 day    Addendum  Patient seen and examined, chart and data base reviewed.  I agree with the above assessment and plan.  For full details please see Mr. Azzie Glatter PA-S note.   Birdie Hopes, MD [facility] Pager: [phone] 12/22/2013, 2:02 PM

## 2013-12-22 NOTE — Progress Notes (Signed)
PT Cancellation Note  Patient Details Name: [name] MRN: [id number] DOB: Sep 04, 1926   Cancelled Treatment:    Reason Eval/Treat Not Completed: PT screened, no needs identified, will sign off (Pt bed/wheelchair bound and uses mechanical lift at facility.)   [place] 12/22/2013, 10:44 AM  [place]

## 2013-12-22 NOTE — Evaluation (Signed)
Occupational Therapy Evaluation Patient Details Name: [name] MRN: [id number] DOB: 03-Oct-1925 Today's Date: 12/22/2013    History of Present Illness 80 y.o. female , resident of [facility], wheelchair mobile, mentally sharp at baseline-able to play bingo, COPD-not oxygen dependent, CVA with residual left hemiparesis, schizophrenia/dementia/depressive disorder, type II DM, anemia, was transferred from the SNF secondary to altered mental status.   Clinical Impression   Patient admitted from SNF, and plans discharge back to SNF.  Patient was minimally participative in ADL's in SNF, and is close to baseline at this time.  Able to assist with rolling to left, feeding self with supervision / cueing, and able to wash face.  No further OT warranted while on acute care.      Follow Up Recommendations  No OT follow up    Equipment Recommendations  None recommended by OT    Recommendations for Other Services       Precautions / Restrictions Precautions Precautions: Fall Restrictions Weight Bearing Restrictions: No Other Position/Activity Restrictions: daughters report that patient has stopped accepting weight on Bilateral LE's since 4 assisted falls at SNF      Mobility Bed Mobility Overal bed mobility: Needs Assistance;+2 for physical assistance Bed Mobility: Rolling Rolling: Mod assist;+2 for physical assistance         General bed mobility comments: min assist to roll to left  Transfers                 General transfer comment: Hydraulic lift at SNF    Balance                                            ADL Overall ADL's : At baseline                                             Vision                     Perception Perception Perception Tested?: No   Praxis Praxis Praxis tested?: Not tested    Pertinent Vitals/Pain No report of pain     Hand Dominance Right   Extremity/Trunk Assessment Upper  Extremity Assessment Upper Extremity Assessment: LUE deficits/detail RUE Deficits / Details: gross range WFL - history of rotator cuff tear LUE Deficits / Details: dense hemiplegia - no movement witnessed / no functional use           Communication Communication Communication: Receptive difficulties;HOH   Cognition Arousal/Alertness: Awake/alert Behavior During Therapy: Flat affect Overall Cognitive Status: History of cognitive impairments - at baseline                     General Comments       Exercises       Shoulder Instructions      Home Living Family/patient expects to be discharged to:: Skilled nursing facility                                        Prior Functioning/Environment Level of Independence: Needs assistance    ADL's / Homemaking Assistance Needed: Couls wash face, feed self, and assist with rolling to left  prior   Comments: Patient at baseline functionally    OT Diagnosis:     OT Problem List:     OT Treatment/Interventions:      OT Goals(Current goals can be found in the care plan section)    OT Frequency:     Barriers to D/C:            Co-evaluation              End of Session Nurse Communication: Need for lift equipment (Used lift in SNF)  Activity Tolerance: Patient tolerated treatment well Patient left: in bed;with family/visitor present;with call bell/phone within reach   Time: 1422-1448 OT Time Calculation (min): 26 min Charges:  OT General Charges $OT Visit: 1 Procedure OT Evaluation $Initial OT Evaluation Tier I: 1 Procedure OT Treatments $Self Care/Home Management : 8-22 mins G-Codes:    [name] 2014/01/17, 2:52 PM

## 2013-12-22 NOTE — Progress Notes (Signed)
INITIAL NUTRITION ASSESSMENT  DOCUMENTATION CODES Per approved criteria  -Obesity Unspecified   INTERVENTION: Recommend SLP evaluation once patient is more alert to determine safest diet texture and liquid consistency. (Per chart review, pt was on a Mechanical Soft diet PTA at SNF.) Pt would benefit from oral nutrition support if intake is poor, to promote wound healing; RD to follow nutrition care plan and add interventions accordingly.  NUTRITION DIAGNOSIS: Inadequate oral intake related to inability to eat as evidenced by NPO status.   Goal: Further diet advancement as tolerated. Intake to meet >90% of estimated nutrition needs.  Monitor:  weight trends, lab trends, I/O's, further diet advancement, po intake  Reason for Assessment: Malnutrition Screening Tool  80 y.o. female  Admitting Dx: UTI (lower urinary tract infection)  ASSESSMENT: PMHx significant for COPD, CVA with residual L hemiparesis, schizophrenia, dementia, depressive disorder, DM2, anemia. From [facility]. Admitted with AMS. Work-up reveals UTI.  Discussed pt during progression rounds. Per chart review, pt was on a Mechanical Soft diet at SNF.  Per chart review, weight has been relatively stable. Pt appears well-nourished. Currently unable to answer any of my questions. No family at bedside.  Height: Ht Readings from Last 1 Encounters:  12/21/13 5\' 9"  (1.753 m)    Weight: Wt Readings from Last 1 Encounters:  12/22/13 204 lb 4.8 oz (92.67 kg)    Ideal Body Weight: 145 lb  % Ideal Body Weight: 141%  Wt Readings from Last 10 Encounters:  12/22/13 204 lb 4.8 oz (92.67 kg)  04/26/13 217 lb 6.4 oz (98.612 kg)  06/15/12 205 lb (92.987 kg)  05/13/12 217 lb 6.4 oz (98.612 kg)    Usual Body Weight: 205 - 217 lb  % Usual Body Weight: 100%  BMI:  Body mass index is 30.16 kg/(m^2). Obese Class I  Estimated Nutritional Needs: Kcal: 1500 - 1650 Protein: 75 - 90 g Fluid: approx 1.8 - 2 liters  daily  Skin:  Stage I to mid-lower sacrum Stage II to R mid-lower sacrum  Diet Order: NPO  EDUCATION NEEDS: -No education needs identified at this time   Intake/Output Summary (Last 24 hours) at 12/22/13 1000 Last data filed at 12/22/13 0607  Gross per 24 hour  Intake     32 ml  Output   1100 ml  Net  -1068 ml    Last BM: PTA  Labs:   Recent Labs Lab 12/21/13 1032 12/22/13 0701  NA 140 139  K 4.2 3.9  CL 103 103  CO2 23 23  BUN 15 10  CREATININE 0.71 0.66  CALCIUM 9.9 9.7  GLUCOSE 98 108*    CBG (last 3)   Recent Labs  12/21/13 2350 12/22/13 0351 12/22/13 0826  GLUCAP 104* 105* 101*    Scheduled Meds: . albuterol  2.5 mg Nebulization TID  . antiseptic oral rinse  15 mL Mouth Rinse BID  . aspirin  300 mg Rectal Daily  . budesonide  0.5 mg Nebulization BID  . cefTRIAXone (ROCEPHIN)  IV  1 g Intravenous Q24H  . cycloSPORINE  1 drop Both Eyes BID  . enoxaparin (LOVENOX) injection  40 mg Subcutaneous Q24H  . fluticasone  2 spray Each Nare Daily  . insulin aspart  0-9 Units Subcutaneous 6 times per day  . lidocaine  1 patch Transdermal Q24H  . olopatadine  1 drop Both Eyes BID  . pantoprazole (PROTONIX) IV  40 mg Intravenous Q24H    Continuous Infusions:   Past Medical History  Diagnosis  Date  . Anemia   . Arthritis   . COPD (chronic obstructive pulmonary disease)   . Chronic schizophrenia   . Dementia   . Depressive disorder   . DM (diabetes mellitus)   . Stroke   . Acute sinusitis   . Breast cancer   . Schizophrenia   . Osteoporosis   . Stenosis of lumbosacral spine     Past Surgical History  Procedure Laterality Date  . Appendectomy    . Breast surgery      breast cancer  . Left knee replacement    . Cataract extraction, bilateral    . Cholecystectomy      Inda Coke MS, RD, LDN Inpatient Registered Dietitian Pager: [phone] After-hours pager: [phone]

## 2013-12-22 NOTE — ED Provider Notes (Signed)
Medical screening examination/treatment/procedure(s) were conducted as a shared visit with non-physician practitioner(s) and myself.  I personally evaluated the patient during the encounter.   EKG Interpretation None       Patient with worsening AMS. Possible UTI as well as CHF. Will admit to hospitalist.  Ephraim Hamburger, MD 12/22/13 [phone]

## 2013-12-23 LAB — CBC
HEMATOCRIT: 33.2 % — AB (ref 36.0–46.0)
Hemoglobin: 10.5 g/dL — ABNORMAL LOW (ref 12.0–15.0)
MCH: 27.1 pg (ref 26.0–34.0)
MCHC: 31.6 g/dL (ref 30.0–36.0)
MCV: 85.6 fL (ref 78.0–100.0)
PLATELETS: 174 10*3/uL (ref 150–400)
RBC: 3.88 MIL/uL (ref 3.87–5.11)
RDW: 15.7 % — ABNORMAL HIGH (ref 11.5–15.5)
WBC: 7 10*3/uL (ref 4.0–10.5)

## 2013-12-23 LAB — GLUCOSE, CAPILLARY
GLUCOSE-CAPILLARY: 103 mg/dL — AB (ref 70–99)
GLUCOSE-CAPILLARY: 110 mg/dL — AB (ref 70–99)
Glucose-Capillary: 115 mg/dL — ABNORMAL HIGH (ref 70–99)
Glucose-Capillary: 119 mg/dL — ABNORMAL HIGH (ref 70–99)

## 2013-12-23 LAB — BASIC METABOLIC PANEL
BUN: 11 mg/dL (ref 6–23)
CHLORIDE: 101 meq/L (ref 96–112)
CO2: 24 mEq/L (ref 19–32)
Calcium: 9.9 mg/dL (ref 8.4–10.5)
Creatinine, Ser: 0.81 mg/dL (ref 0.50–1.10)
GFR calc non Af Amer: 64 mL/min — ABNORMAL LOW (ref >90)
GFR, EST AFRICAN AMERICAN: 74 mL/min — AB (ref >90)
Glucose, Bld: 110 mg/dL — ABNORMAL HIGH (ref 70–99)
Potassium: 4 mEq/L (ref 3.7–5.3)
Sodium: 139 mEq/L (ref 137–147)

## 2013-12-23 LAB — URINE CULTURE

## 2013-12-23 MED ORDER — FAMOTIDINE IN NACL 20-0.9 MG/50ML-% IV SOLN
20.0000 mg | INTRAVENOUS | Status: DC
  Filled 2013-12-23: qty 50

## 2013-12-23 MED ORDER — LINEZOLID 600 MG PO TABS: 600.0000 mg | ORAL_TABLET | Freq: Two times a day (BID) | ORAL | Status: DC

## 2013-12-23 MED ORDER — AMPICILLIN 500 MG PO CAPS: 500.0000 mg | ORAL_CAPSULE | Freq: Four times a day (QID) | ORAL | Status: DC

## 2013-12-23 MED ORDER — HYDROCODONE-ACETAMINOPHEN 5-325 MG PO TABS: 0.5000 | ORAL_TABLET | Freq: Four times a day (QID) | ORAL | Status: DC | PRN

## 2013-12-23 MED ORDER — RESOURCE THICKENUP CLEAR PO POWD
ORAL | Status: DC | PRN
  Filled 2013-12-23: qty 125

## 2013-12-23 MED ORDER — BISACODYL 10 MG RE SUPP
10.0000 mg | Freq: Every day | RECTAL | Status: DC | PRN
Start: 2013-12-23 — End: 2015-04-11

## 2013-12-23 MED ORDER — SENNA 8.6 MG PO TABS: 2.0000 | ORAL_TABLET | Freq: Every day | ORAL | Status: DC

## 2013-12-23 MED ORDER — FLEET ENEMA 7-19 GM/118ML RE ENEM
1.0000 | ENEMA | Freq: Once | RECTAL | Status: AC
  Administered 2013-12-23: 1 via RECTAL
  Filled 2013-12-23: qty 1

## 2013-12-23 MED ORDER — FLEET ENEMA 7-19 GM/118ML RE ENEM: 1.0000 | ENEMA | Freq: Every day | RECTAL | Status: DC | PRN

## 2013-12-23 NOTE — Clinical Social Work Psychosocial (Signed)
Clinical Social Work Department BRIEF PSYCHOSOCIAL ASSESSMENT 12/23/2013  Patient:  [name], [name]     Account Number:  [id number]     Admit date:  12/21/2013  Clinical Social Worker:  Lovey Newcomer  Date/Time:  12/22/2013 02:00 PM  Referred by:  Physician  Date Referred:  12/23/2013 Referred for  SNF Placement   Other Referral:   Interview type:  Family Other interview type:   Patient and two daughters interviewed at bedside.    PSYCHOSOCIAL DATA Living Status:  FACILITY Admitted from facility:  [facility] Level of care:  [place] Primary support name:  Shireen Quan Primary support relationship to patient:  CHILD, ADULT Degree of support available:   Support is strong.    CURRENT CONCERNS Current Concerns  Post-Acute Placement   Other Concerns:    SOCIAL WORK ASSESSMENT / PLAN CSW met with patient and daughters at bedside to complete assessment. Daughters confirm that patient is from [facility] SN[facility] as a long term resident, but they plan for her to DC to a different facility because of some "issues" they had with [facility]. In addition to having some problems with the facility the daughters would like to find a SNF location that is more fair for both of them visit as one of the daughters lives in [place].    Patient lived at [facility] for three years as a long term resident and patient's daughter would like for her to go to [facility], [facility], or [facility]. CSW explained the SNF search/placement process to daughters and answered their questions. Patient's daughter are "burnt out" from the past three weeks as caring for their mother has taken them away from their other family responsibilities. Both daughters and patient were appreciative of CSW's assistance.   Assessment/plan status:  Psychosocial Support/Ongoing Assessment of Needs Other assessment/ plan:   Complete FL2, Fax, Confirm PASRR    Information/referral to community resources:   CSW contact information and SNF list given to daughters.    PATIENT'S/FAMILY'S RESPONSE TO PLAN OF CARE: Patient and daughters plan for patient to DC to a new SNF for long  term placement. Patient and daughters were pleasant, appropriate, and appreciative of CSW visit. CSW will follow up with bed offers when available.       Liz Beach MSW, [place], [place], [phone]

## 2013-12-23 NOTE — Progress Notes (Signed)
Speech Language Pathology Treatment: Dysphagia  Patient Details Name: [name] MRN: [id number] DOB: [birth year] Today's Date: 12/23/2013 Time: 6579-0383 SLP Time Calculation (min): 21 min  Assessment / Plan / Recommendation Clinical Impression  Pt seen for second check with thin liquids given inconsistent findings on assessment. Pts dtr present, reports her mother has been getting choking frequently during meals over the last month. Pt was not coughing upon arrival, but coughed after 50% of trials of thin liquids, even with very small cup sips. SLP provided trials of nectar thick liquids with no coughing from pt. Pts dtr recognized improvement and agreed to nectar thick liquids to increase safety and reduce need for close supervision. Pt did not seem to notice difference in texture. Will defer f/u to next level of care as pt with plans to d/c today.    HPI HPI: 80 yo female adm. from SNF with MS change (decreased responsiveness and poor po intake).  PMHx significant for COPD, CVA with residual L hemiparesis, schizophrenia, dementia, depressive disorder, DM2, anemia. Work-up reveals UTI.  CT of head:  Chronic age-related involutional findings.  No acute abnormality.   Pertinent Vitals NA  SLP Plan  Continue with current plan of care    Recommendations Diet recommendations: Dysphagia 3 (mechanical soft);Nectar-thick liquid Liquids provided via: Cup Medication Administration: Whole meds with puree Supervision: Patient able to self feed;Staff to assist with self feeding;Full supervision/cueing for compensatory strategies Compensations: Slow rate;Small sips/bites;Check for pocketing Postural Changes and/or Swallow Maneuvers: Seated upright 90 degrees              Oral Care Recommendations: Oral care BID;Staff/trained caregiver to provide oral care Follow up Recommendations: Skilled Nursing facility Plan: Continue with current plan of care    GO    [facility], MA CCC-SLP  [place] [name] 12/23/2013, 3:09 PM

## 2013-12-23 NOTE — Clinical Social Work Note (Signed)
Updated DC Summary with updated diet recommendations has been faxed to receiving facility.  Liz Beach MSW, [place], [place], [phone]

## 2013-12-23 NOTE — Clinical Social Work Placement (Signed)
Clinical Social Work Department CLINICAL SOCIAL WORK PLACEMENT NOTE 12/23/2013  Patient:  [name], [name]  Account Number:  [id number] Admit date:  12/21/2013  Clinical Social Worker:  Lovey Newcomer  Date/time:  12/23/2013 02:54 PM  Clinical Social Work is seeking post-discharge placement for this patient at the following level of care:   [place]   (*CSW will update this form in Epic as items are completed)   12/23/2013  Patient/family provided with [facility] Department of Clinical Social Work's list of facilities offering this level of care within the geographic area requested by the patient (or if unable, by the patient's family).  12/23/2013  Patient/family informed of their freedom to choose among providers that offer the needed level of care, that participate in Medicare, Medicaid or managed care program needed by the patient, have an available bed and are willing to accept the patient.  12/23/2013  Patient/family informed of [facility]' ownership interest in [facility], as well as of the fact that they are under no obligation to receive care at this facility.  PASARR submitted to EDS on  PASARR number received from [place] on   FL2 transmitted to all facilities in geographic area requested by pt/family on  12/23/2013 FL2 transmitted to all facilities within larger geographic area on   Patient informed that his/her managed care company has contracts with or will negotiate with  certain facilities, including the following:     Patient/family informed of bed offers received:  12/23/2013 Patient chooses bed at [facility] Physician recommends and patient chooses bed at    Patient to be transferred to [facility] on  12/23/2013 Patient to be transferred to facility by [facility]  The following physician request were entered in Epic:   Additional Comments: Per MD patient ready to DC to [facility]. RN, patient, family, and facility notified  of DC. RN given number for report. DC packet on chart. Ambulance transport requested for patient for 4:00pm. CSW signing off at this time.   Liz Beach MSW, [place], [place], [phone]

## 2013-12-23 NOTE — Discharge Summary (Addendum)
Physician Discharge Summary  [name] ZOX:[id number] DOB: [birth year] DOA: 12/21/2013  PCP: Wenda Low, MD  Admit date: 12/21/2013 Discharge date: 12/23/2013  Time spent: 45 minutes  Recommendations for Outpatient Follow-up:  1. Ampicillin 500 mg four times daily for 5 days from 12/23/48 2. Remove Foley catheter after completion of antibiotics for a voiding trial, if fails then needs referral to Urology 3. Dysphagia 3 diet with nectar thick liquids, standard aspiration precautions and supervision with feeding. 4. Please continue to monitor CBC and chemistries periodically. 5.   Continue proper hygiene of affected areas.  Discharge Diagnoses:  Active Problems:   COPD (chronic obstructive pulmonary disease)   Anemia   Chronic schizophrenia   Dementia   Depressive disorder   DM (diabetes mellitus)   Stroke   Discharge Condition: Stable  Diet recommendation: Liquid diet, slowly advance as tolerated   Filed Weights   12/21/13 1828 12/22/13 0603 12/23/13 0455  Weight: 94.212 kg (207 lb 11.2 oz) 92.67 kg (204 lb 4.8 oz) 93.033 kg (205 lb 1.6 oz)    History of present illness:  [name] is a 80 y.o. female , resident of [facility], wheelchair mobile, mentally sharp at baseline-able to play bingo, COPD-not oxygen dependent, CVA with residual left hemiparesis, schizophrenia/dementia/depressive disorder, type II DM, anemia, was transferred from the SNF secondary to altered mental status. Apparently while at the skilled nursing facility, patient was complaining of dysuria and was having acute urinary retention. She was started on intramuscular Rocephin for 3 days, however her symptoms persisted and she started becoming more altered, as a result she was referred to the emergency room. In the emergency room, a CT of the head was negative for acute abnormalities, a urinalysis was consistent with UTI.   Hospital Course:  UTI:  -Urinalysis consistent with UTI. Started on Rocephin on  12/22/2013, a urine culture obtained while at the skilled nursing facility is positive for Streptococcus viridans. She was maintained on Rocephin throughout the hospitalization, she is now afebrile, she does not have any leukocytosis. Her encephalopathy has resolved. Urine culture obtained this hospitalization is positive for enterococcus species. I am not sure if this is a contamination or colonization as she has improved on Rocephin. I have spoken with Dr. Drucilla Schmidt over the phone, current recommendations are for Zyvox for another 5 more days.  Acute encephalopathy: - Secondary to acute medical illness including UTI and mild dehydration, complicating underlying dementia and psychiatric illness. - CT head without acute findings. Mental status is back to baseline after treatment of UTI.  Type II DM: - Was managed with sliding scale insulin while inpatient, will resume her metformin on discharge.  Dysphagia:  - Was seen by speech therapy during this admission, current recommendations are for a dysphagia 3 diet with thin liquids. Spoke with daughter, she is agreeable with this recommendation.   Anemia:  - Suspect anemia of chronic disease, hemoglobin is stable at 10.5. Suggest continued monitoring in the outpatient setting.  Chronic schizophrenia/dementia/depressive disorder:  - Significantly better once encephalopathy resolved, patient back to baseline. Continue Zoloft and fluphenazine on discharge.  History of CVA and residual left hemiparesis:  -CT head without acute findings. Continue with aspirin and statin.  History of acute urinary retention:  - Per the history obtained, patient has had numerous bouts of acute urinary retention requiring straight in and out catheterization was at the skilled nursing facility, finally had a Foley catheter placed prior to this admission. Suspect this is secondary to acute UTI, suggest  a voiding trial after completion of antibiotic treatment. If continues to have  acute urinary retention, may need urology evaluation at some point. Will defer this to the attending physician at the skilled nursing facility.  Procedures:  none  Consultations:  none   Discharge Exam: Filed Vitals:   12/23/13 0455  BP: 147/78  Pulse: 76  Temp: 98.6 F (37 C)  Resp: 18    Constitutional: She appears well-nourished. No distress.  HENT:  Head: Normocephalic and atraumatic.  Neck: No JVD present.  Cardiovascular: Normal rate, regular rhythm, normal heart sounds and intact distal pulses. Exam reveals no gallop and no friction rub.  No murmur heard.  Respiratory: Effort normal. No respiratory distress. She has decreased breath sounds. She exhibits no tenderness.  GI: Soft. Bowel sounds are normal. She exhibits no distension. There is no tenderness.  Skin: She is not diaphoretic.    Discharge Instructions You were cared for by a hospitalist during your hospital stay. If you have any questions about your discharge medications or the care you received while you were in the hospital after you are discharged, you can call the unit and asked to speak with the hospitalist on call if the hospitalist that took care of you is not available. Once you are discharged, your primary care physician will handle any further medical issues. Please note that NO REFILLS for any discharge medications will be authorized once you are discharged, as it is imperative that you return to your primary care physician (or establish a relationship with a primary care physician if you do not have one) for your aftercare needs so that they can reassess your need for medications and monitor your lab values.      Discharge Orders   Future Orders Complete By Expires   Call MD for:  temperature >100.4  As directed    Diet - low sodium heart healthy  As directed    Scheduling Instructions:   Dysphagia 3 diet with thin liquids   Diet Carb Modified  As directed    Scheduling Instructions:    Dysphagia 3 diet with nectar thick liquids   Diet general  As directed    Increase activity slowly  As directed    Increase activity slowly  As directed        Medication List    STOP taking these medications       cefTRIAXone 1 G injection  Commonly known as:  ROCEPHIN     docusate sodium 100 MG capsule  Commonly known as:  COLACE     fluconazole 100 MG tablet  Commonly known as:  DIFLUCAN      TAKE these medications       acetaminophen 500 MG tablet  Commonly known as:  TYLENOL  Take 500 mg by mouth 2 (two) times daily.     albuterol (2.5 MG/3ML) 0.083% nebulizer solution  Commonly known as:  PROVENTIL  Take 2.5 mg by nebulization 3 (three) times daily.     aspirin 325 MG tablet  Take 325 mg by mouth daily.     bisacodyl 10 MG suppository  Commonly known as:  DULCOLAX  Place 1 suppository (10 mg total) rectally daily as needed for moderate constipation.     budesonide 0.5 MG/2ML nebulizer solution  Commonly known as:  PULMICORT  Take 0.5 mg by nebulization 2 (two) times daily.     cetirizine 10 MG tablet  Commonly known as:  ZYRTEC  Take 10 mg by mouth daily.  cholecalciferol 1000 UNITS tablet  Commonly known as:  VITAMIN D  Take 2,000 Units by mouth daily.     cycloSPORINE 0.05 % ophthalmic emulsion  Commonly known as:  RESTASIS  Place 1 drop into both eyes 2 (two) times daily.     donepezil 10 MG tablet  Commonly known as:  ARICEPT  Take 10 mg by mouth at bedtime.     FLUPHENAZINE DECANOATE IJ  Inject 25 mg/mL as directed every 30 (thirty) days. Next shot due 01/02/14     fluticasone 50 MCG/ACT nasal spray  Commonly known as:  FLONASE  Place 2 sprays into the nose daily.     gabapentin 400 MG capsule  Commonly known as:  NEURONTIN  Take 400 mg by mouth 3 (three) times daily.     HYDROcodone-acetaminophen 5-325 MG per tablet  Commonly known as:  NORCO/VICODIN  Take 0.5 tablets by mouth every 6 (six) hours as needed for moderate pain.      ipratropium-albuterol 0.5-2.5 (3) MG/3ML Soln  Commonly known as:  DUONEB  Take 3 mLs by nebulization every 6 (six) hours as needed (shortness of breath).     lidocaine 5 %  Commonly known as:  LIDODERM  Place 1 patch onto the skin daily. 12 hours on 12 hours off     linezolid 600 MG tablet  Commonly known as:  ZYVOX  Take 1 tablet (600 mg total) by mouth 2 (two) times daily. For 5 more days from 12/23/13     metFORMIN 500 MG tablet  Commonly known as:  GLUCOPHAGE  Take 500 mg by mouth daily.     olopatadine 0.1 % ophthalmic solution  Commonly known as:  PATANOL  Place 1 drop into both eyes 2 (two) times daily.     omeprazole 40 MG capsule  Commonly known as:  PRILOSEC  Take 40 mg by mouth 2 (two) times daily.     polyethylene glycol packet  Commonly known as:  MIRALAX / GLYCOLAX  Take 17 g by mouth daily.     pravastatin 40 MG tablet  Commonly known as:  PRAVACHOL  Take 40 mg by mouth daily.     senna 8.6 MG Tabs tablet  Commonly known as:  SENOKOT  Take 2 tablets (17.2 mg total) by mouth at bedtime.     sertraline 50 MG tablet  Commonly known as:  ZOLOFT  Take 50 mg by mouth daily.     sodium phosphate 7-19 GM/118ML Enem  Place 1 enema rectally daily as needed for severe constipation.       No Known Allergies Follow-up Information   Follow up with HUSAIN,KARRAR, MD. Schedule an appointment as soon as possible for a visit in 1 week.   Specialty:  Internal Medicine   Contact information:   [address] [place] [address] [phone]        The results of significant diagnostics from this hospitalization (including imaging, microbiology, ancillary and laboratory) are listed below for reference.    Significant Diagnostic Studies: Ct Head Wo Contrast  12/21/2013   CLINICAL DATA:  from nursing home due to increased lethargy, history of urinary tract infection  EXAM: CT HEAD WITHOUT CONTRAST  TECHNIQUE: Contiguous axial images were obtained  from the base of the skull through the vertex without intravenous contrast.  COMPARISON:  None.  FINDINGS: Moderate diffuse atrophy. Moderate to severe low attenuation in the deep periventricular white matter. Evidence of chronic lacunar infarction in the region of the  right basal ganglia. No evidence of vascular territory infarct, hemorrhage, or extra-axial fluid. Calvarium is intact.  IMPRESSION: Chronic age-related involutional findings.  No acute abnormality.   Electronically Signed   By: Skipper Cliche M.D.   On: 12/21/2013 14:22   Ct Abdomen Pelvis W Contrast  12/21/2013   CLINICAL DATA:  Fatigue.  Increased lethargy.  EXAM: CT ABDOMEN AND PELVIS WITH CONTRAST  TECHNIQUE: Multidetector CT imaging of the abdomen and pelvis was performed using the standard protocol following bolus administration of intravenous contrast.  CONTRAST:  54mL OMNIPAQUE IOHEXOL 300 MG/ML  SOLN  COMPARISON:  None.  FINDINGS: Small bilateral effusions and atelectasis are noted. The heart is mildly enlarged. There is no significant pericardial effusion.  The liver and spleen are within normal limits. There is some artifact from patient arm positioning. The stomach and duodenum are within normal limits. The pancreas is unremarkable. Common bile duct and gallbladder are within normal limits. Adrenal glands are normal bilaterally. Bilateral renal cysts are present, largest at the upper pole of the left kidney measuring 11.7 x 10.2 cm. Atherosclerotic calcifications are present in the abdominal aorta.  The rectosigmoid colon is within normal limits. The remainder the colon is unremarkable. The appendix is not visualized and may be surgically absent. The small bowel is unremarkable. No significant adenopathy or free fluid is present. A Foley catheter is present within the urinary bladder. The uterus and adnexa are normal for age.  There is ankylosis across the disc space at L5-S1. Multilevel facet degenerative changes are evident. No focal  lytic or blastic lesions are present.  IMPRESSION: 1. Bilateral pleural effusions and atelectasis. 2. Prominent bilateral renal cysts, left greater than right. 3. No acute or focal lesion to explain the patient's symptoms.   Electronically Signed   By: Lawrence Santiago M.D.   On: 12/21/2013 14:32   Dg Chest Portable 1 View  12/21/2013   CLINICAL DATA:  History of pneumonia and previous CVA  EXAM: PORTABLE CHEST - 1 VIEW  COMPARISON:  DG CHEST 1 VIEW dated 05/13/2012  FINDINGS: The lungs are adequately inflated. There are coarse lung markings in the left lateral costophrenic angle which are not new and may reflect atelectasis or scarring. The cardiopericardial silhouette is enlarged but stable. The pulmonary vascularity is not engorged. There is mild tortuosity of the descending thoracic aorta. The observed portions of the bony thorax exhibit no acute abnormalities.  IMPRESSION: The findings are consistent with COPD and likely low-grade compensated CHF. There is no evidence of pneumonia. Atelectasis versus scarring at the left lung base is suspected. A followup PA and lateral chest x-ray would be of value when the patient can tolerate the procedure.   Electronically Signed   By: David  Martinique   On: 12/21/2013 11:48    Microbiology: Recent Results (from the past 240 hour(s))  URINE CULTURE     Status: None   Collection Time    12/21/13 11:32 AM      Result Value Ref Range Status   Specimen Description URINE, CATHETERIZED   Final   Special Requests NONE   Final   Culture  Setup Time     Final   Value: 12/21/2013 11:51     Performed at [facility]     Final   Value: >=100,000 COLONIES/ML     Performed at [facility]   Culture     Final   Value: ENTEROCOCCUS SPECIES     Performed at  [facility]   Report Status 12/23/2013 FINAL   Final   Organism ID, Bacteria ENTEROCOCCUS SPECIES   Final  MRSA PCR SCREENING     Status: None   Collection Time    12/21/13   8:24 PM      Result Value Ref Range Status   MRSA by PCR NEGATIVE  NEGATIVE Final   Comment:            The GeneXpert MRSA Assay (FDA     approved for NASAL specimens     only), is one component of a     comprehensive MRSA colonization     surveillance program. It is not     intended to diagnose MRSA     infection nor to guide or     monitor treatment for     MRSA infections.     Labs: Basic Metabolic Panel:  Recent Labs Lab 12/21/13 1032 12/22/13 0701 12/23/13 0441  NA 140 139 139  K 4.2 3.9 4.0  CL 103 103 101  CO2 23 23 24   GLUCOSE 98 108* 110*  BUN 15 10 11   CREATININE 0.71 0.66 0.81  CALCIUM 9.9 9.7 9.9   Liver Function Tests:  Recent Labs Lab 12/21/13 1032  AST 12  ALT 11  ALKPHOS 71  BILITOT 0.3  PROT 6.3  ALBUMIN 2.6*   No results found for this basename: LIPASE, AMYLASE,  in the last 168 hours No results found for this basename: AMMONIA,  in the last 168 hours CBC:  Recent Labs Lab 12/21/13 1032 12/22/13 0701 12/23/13 0441  WBC 7.4 5.9 7.0  NEUTROABS 5.0  --   --   HGB 11.0* 10.8* 10.5*  HCT 34.7* 34.7* 33.2*  MCV 86.1 86.5 85.6  PLT 183 170 174   Cardiac Enzymes:  Recent Labs Lab 12/21/13 1033  TROPONINI <0.30   BNP: BNP (last 3 results)  Recent Labs  12/21/13 2106  PROBNP 146.3   CBG:  Recent Labs Lab 12/22/13 1649 12/22/13 1959 12/23/13 0008 12/23/13 0346 12/23/13 0826  GLUCAP 115* 117* 119* 103* 110*       Signed:  Azzie Glatter PA-S  [facility] 12/23/2013, 11:23 AM  Attending - Patient seen and examined, agree with the above assessment and plan. The female admitted with encephalopathy from UTI. After initiation of IV Rocephin, significantly better, mental status back to usual baseline. Urine culture done at the skilled nursing facility (on 12/16/13) obtained, positive for Streptococcus viridans, however urine culture done at this facility positive for enterococcus species which is resistant to  ampicillin. I suspect the latter to be a contamination, however we'll discharge on Zyvox for another 5 more days. It should cover both of the above organisms.  Nena Alexander MD

## 2013-12-23 NOTE — Progress Notes (Signed)
Pt with d/c orders to SNF, report called to [facility]. PIV removed and intact. All paper work with patient upon discharge. Pt and family with no further questions. Pt stable upon discharge to SNF.

## 2013-12-27 ENCOUNTER — Non-Acute Institutional Stay (SKILLED_NURSING_FACILITY): Payer: PRIVATE HEALTH INSURANCE | Admitting: Internal Medicine

## 2013-12-27 DIAGNOSIS — R5381 Other malaise: Secondary | ICD-10-CM

## 2013-12-27 DIAGNOSIS — N390 Urinary tract infection, site not specified: Secondary | ICD-10-CM

## 2013-12-27 DIAGNOSIS — F205 Residual schizophrenia: Secondary | ICD-10-CM

## 2013-12-27 DIAGNOSIS — K219 Gastro-esophageal reflux disease without esophagitis: Secondary | ICD-10-CM

## 2013-12-27 DIAGNOSIS — R1319 Other dysphagia: Secondary | ICD-10-CM

## 2013-12-27 DIAGNOSIS — R531 Weakness: Secondary | ICD-10-CM

## 2013-12-27 DIAGNOSIS — F209 Schizophrenia, unspecified: Secondary | ICD-10-CM

## 2013-12-27 DIAGNOSIS — E1142 Type 2 diabetes mellitus with diabetic polyneuropathy: Secondary | ICD-10-CM

## 2013-12-27 DIAGNOSIS — B952 Enterococcus as the cause of diseases classified elsewhere: Secondary | ICD-10-CM

## 2013-12-27 DIAGNOSIS — I69959 Hemiplegia and hemiparesis following unspecified cerebrovascular disease affecting unspecified side: Secondary | ICD-10-CM

## 2013-12-27 DIAGNOSIS — R339 Retention of urine, unspecified: Secondary | ICD-10-CM

## 2013-12-27 DIAGNOSIS — D638 Anemia in other chronic diseases classified elsewhere: Secondary | ICD-10-CM

## 2013-12-27 DIAGNOSIS — R5383 Other fatigue: Secondary | ICD-10-CM

## 2013-12-27 DIAGNOSIS — N189 Chronic kidney disease, unspecified: Secondary | ICD-10-CM

## 2013-12-28 ENCOUNTER — Encounter: Payer: Self-pay | Admitting: Internal Medicine

## 2013-12-28 NOTE — Progress Notes (Signed)
Patient ID: [name], female   DOB: 11-May-1926, 80 y.o.   MRN: [id number]     Facility: [facility]    PCP: Wenda Low, MD  No Known Allergies  Chief Complaint: new admission  HPI:  80 y/o female patient is here for long term care. She was in the hospital from 12/21/13- 12/23/13 with AMS. Ct head was negative for acute process. She did have a UTI thought. She was started on antibiotics for streptococcus viridans and enterococcus. her encephalopathy resolved.  She was residing in another SNF prior to hospitalization. She has history of copd, cva with left hemiparesis, schizophremia and some dementia, dm and anemia.  She was seen today in her room. She denies any concerns this visit.   Review of Systems:  Constitutional: Negative for fever, chills HENT: Negative for congestion, hearing loss and sore throat.   Eyes: Negative for eye pain Respiratory: Negative for cough, sputum production, shortness of breath and wheezing.   Cardiovascular: Negative for chest pain, palpitations, orthopnea and leg swelling.  Gastrointestinal: Negative for heartburn, nausea, vomiting, abdominal pain  Genitourinary: has a foley in place Musculoskeletal: Negative for back pain, falls Skin: Negative for itching and rash.  Neurological: Negative for dizziness, tingling, focal weakness and headaches.  Psychiatric/Behavioral: has some memory loss. The patient is not nervous/anxious.     Past Medical History  Diagnosis Date  . Anemia   . Arthritis   . COPD (chronic obstructive pulmonary disease)   . Chronic schizophrenia   . Dementia   . Depressive disorder   . DM (diabetes mellitus)   . Stroke   . Acute sinusitis   . Breast cancer   . Schizophrenia   . Osteoporosis   . Stenosis of lumbosacral spine    Past Surgical History  Procedure Laterality Date  . Appendectomy    . Breast surgery      breast cancer  . Left knee replacement    . Cataract extraction, bilateral     . Cholecystectomy     Social History:   reports that she quit smoking about 10 years ago. Her smoking use included Cigarettes. She has a 15 pack-year smoking history. She does not have any smokeless tobacco history on file. She reports that she does not drink alcohol or use illicit drugs.  Family History  Problem Relation Age of Onset  . Asthma Daughter   . Heart disease Sister     3 sisters  . Heart disease Brother     x 2  . Cancer Brother     multiple myloma    Medications: Patient's Medications  New Prescriptions   No medications on file  Previous Medications   ACETAMINOPHEN (TYLENOL) 500 MG TABLET    Take 500 mg by mouth 2 (two) times daily.   ALBUTEROL (PROVENTIL) (2.5 MG/3ML) 0.083% NEBULIZER SOLUTION    Take 2.5 mg by nebulization 3 (three) times daily.   ASPIRIN 325 MG TABLET    Take 325 mg by mouth daily.   BISACODYL (DULCOLAX) 10 MG SUPPOSITORY    Place 1 suppository (10 mg total) rectally daily as needed for moderate constipation.   BUDESONIDE (PULMICORT) 0.5 MG/2ML NEBULIZER SOLUTION    Take 0.5 mg by nebulization 2 (two) times daily.   CETIRIZINE (ZYRTEC) 10 MG TABLET    Take 10 mg by mouth daily.   CHOLECALCIFEROL (VITAMIN D) 1000 UNITS TABLET    Take 2,000 Units by mouth daily.   CYCLOSPORINE (RESTASIS) 0.05 % OPHTHALMIC  EMULSION    Place 1 drop into both eyes 2 (two) times daily.    DONEPEZIL (ARICEPT) 10 MG TABLET    Take 10 mg by mouth at bedtime.    FLUPHENAZINE DECANOATE IJ    Inject 25 mg/mL as directed every 30 (thirty) days. Next shot due 01/02/14   FLUTICASONE (FLONASE) 50 MCG/ACT NASAL SPRAY    Place 2 sprays into the nose daily.   GABAPENTIN (NEURONTIN) 400 MG CAPSULE    Take 400 mg by mouth 3 (three) times daily.   HYDROCODONE-ACETAMINOPHEN (NORCO/VICODIN) 5-325 MG PER TABLET    Take 0.5 tablets by mouth every 6 (six) hours as needed for moderate pain.   IPRATROPIUM-ALBUTEROL (DUONEB) 0.5-2.5 (3) MG/3ML SOLN    Take 3 mLs by nebulization every 6 (six)  hours as needed (shortness of breath).    LIDOCAINE (LIDODERM) 5 %    Place 1 patch onto the skin daily. 12 hours on 12 hours off   LINEZOLID (ZYVOX) 600 MG TABLET    Take 1 tablet (600 mg total) by mouth 2 (two) times daily. For 5 more days from 12/23/13   METFORMIN (GLUCOPHAGE) 500 MG TABLET    Take 500 mg by mouth daily.    OLOPATADINE (PATANOL) 0.1 % OPHTHALMIC SOLUTION    Place 1 drop into both eyes 2 (two) times daily.    OMEPRAZOLE (PRILOSEC) 40 MG CAPSULE    Take 40 mg by mouth 2 (two) times daily.   POLYETHYLENE GLYCOL (MIRALAX / GLYCOLAX) PACKET    Take 17 g by mouth daily.   PRAVASTATIN (PRAVACHOL) 40 MG TABLET    Take 40 mg by mouth daily.   SENNA (SENOKOT) 8.6 MG TABS TABLET    Take 2 tablets (17.2 mg total) by mouth at bedtime.   SERTRALINE (ZOLOFT) 50 MG TABLET    Take 50 mg by mouth daily.   SODIUM PHOSPHATE (FLEET) 7-19 GM/118ML ENEM    Place 1 enema rectally daily as needed for severe constipation.  Modified Medications   No medications on file  Discontinued Medications   No medications on file     Physical Exam: Filed Vitals:   12/27/13 1858  BP: 120/76  Pulse: 71  Temp: 97.5 F (36.4 C)  Resp: 18  SpO2: 96%   General- elderly female in no acute distress Head- atraumatic, normocephalic Eyes- PERRLA, EOMI, no pallor, no icterus, no discharge Neck- no lymphadenopathy, no jugular vein distension Cardiovascular- normal s1,s2, no murmurs/ rubs/ gallops Respiratory- bilateral clear to auscultation, no wheeze, no rhonchi, no crackles, no use of accessory muscles Abdomen- bowel sounds present, soft, non tender Musculoskeletal- able to move all 4 extremities, no leg edema Neurological- no focal deficit Skin- warm and dry Psychiatry-  normal mood and affect, alert and oriented   Labs reviewed: Basic Metabolic Panel:  Recent Labs  12/21/13 1032 12/22/13 0701 12/23/13 0441  NA 140 139 139  K 4.2 3.9 4.0  CL 103 103 101  CO2 23 23 24   GLUCOSE 98 108* 110*    BUN 15 10 11   CREATININE 0.71 0.66 0.81  CALCIUM 9.9 9.7 9.9   Liver Function Tests:  Recent Labs  12/21/13 1032  AST 12  ALT 11  ALKPHOS 71  BILITOT 0.3  PROT 6.3  ALBUMIN 2.6*   No results found for this basename: LIPASE, AMYLASE,  in the last 8760 hours No results found for this basename: AMMONIA,  in the last 8760 hours CBC:  Recent Labs  12/21/13 1032 12/22/13  0701 12/23/13 0441  WBC 7.4 5.9 7.0  NEUTROABS 5.0  --   --   HGB 11.0* 10.8* 10.5*  HCT 34.7* 34.7* 33.2*  MCV 86.1 86.5 85.6  PLT 183 170 174   Cardiac Enzymes:  Recent Labs  12/21/13 1033  TROPONINI <0.30   BNP: No components found with this basename: POCBNP,  CBG:  Recent Labs  12/23/13 0346 12/23/13 0826 12/23/13 1206  GLUCAP 103* 110* 115*    Radiological Exams: Ct Head Wo Contrast  12/21/2013   CLINICAL DATA:  from nursing home due to increased lethargy, history of urinary tract infection  EXAM: CT HEAD WITHOUT CONTRAST  TECHNIQUE: Contiguous axial images were obtained from the base of the skull through the vertex without intravenous contrast.  COMPARISON:  None.  FINDINGS: Moderate diffuse atrophy. Moderate to severe low attenuation in the deep periventricular white matter. Evidence of chronic lacunar infarction in the region of the right basal ganglia. No evidence of vascular territory infarct, hemorrhage, or extra-axial fluid. Calvarium is intact. IMPRESSION: Chronic age-related involutional findings.  No acute abnormality.   Electronically Signed   By: Skipper Cliche M.D.   On: 12/21/2013 14:22   Ct Abdomen Pelvis W Contrast  12/21/2013   CLINICAL DATA:  Fatigue.  Increased lethargy.  EXAM: CT ABDOMEN AND PELVIS WITH CONTRAST  TECHNIQUE: Multidetector CT imaging of the abdomen and pelvis was performed using the standard protocol following bolus administration of intravenous contrast.  CONTRAST:  19mL OMNIPAQUE IOHEXOL 300 MG/ML  SOLN  COMPARISON:  None.  FINDINGS: Small bilateral  effusions and atelectasis are noted. The heart is mildly enlarged. There is no significant pericardial effusion.  The liver and spleen are within normal limits. There is some artifact from patient arm positioning. The stomach and duodenum are within normal limits. The pancreas is unremarkable. Common bile duct and gallbladder are within normal limits. Adrenal glands are normal bilaterally. Bilateral renal cysts are present, largest at the upper pole of the left kidney measuring 11.7 x 10.2 cm. Atherosclerotic calcifications are present in the abdominal aorta.  The rectosigmoid colon is within normal limits. The remainder the colon is unremarkable. The appendix is not visualized and may be surgically absent. The small bowel is unremarkable. No significant adenopathy or free fluid is present. A Foley catheter is present within the urinary bladder. The uterus and adnexa are normal for age. There is ankylosis across the disc space at L5-S1. Multilevel facet degenerative changes are evident. No focal lytic or blastic lesions are present.  IMPRESSION: 1. Bilateral pleural effusions and atelectasis. 2. Prominent bilateral renal cysts, left greater than right. 3. No acute or focal lesion to explain the patient's symptoms.   Electronically Signed   By: Lawrence Santiago M.D.   On: 12/21/2013 14:32   Dg Chest Portable 1 View  12/21/2013   CLINICAL DATA:  History of pneumonia and previous CVA  EXAM: PORTABLE CHEST - 1 VIEW  COMPARISON:  DG CHEST 1 VIEW dated 05/13/2012  FINDINGS: The lungs are adequately inflated. There are coarse lung markings in the left lateral costophrenic angle which are not new and may reflect atelectasis or scarring. The cardiopericardial silhouette is enlarged but stable. The pulmonary vascularity is not engorged. There is mild tortuosity of the descending thoracic aorta. The observed portions of the bony thorax exhibit no acute abnormalities.  IMPRESSION: The findings are consistent with COPD and likely  low-grade compensated CHF. There is no evidence of pneumonia. Atelectasis versus scarring at the left lung base  is suspected. A followup PA and lateral chest x-ray would be of value when the patient can tolerate the procedure.   Electronically Signed   By: David  Martinique   On: 12/21/2013 11:48    Assessment/Plan  Generalized weakness From deconditioning in setting of infection. To complete her course of antibiotics. Will have patient work with PT/OT as tolerated to regain strength and restore function.  Fall precautions are in place.  Enterococcus uti Complete course of zyvox. Continue foley care  cva with left sided hemiplegia Continue aspirin and statin monitor bp readings. Skin care and pressure ulcer prophyalxis, encourage to be out of bed as tolerated. Fall precautions  Urinary retention Has foley in place. Will need urology follow up. On uti treatment, after completion of antibiotics, will try voiding trial  Type 2 DM Continue her metformin and monitor cbg  Dysphagia Continue dysphagia 3 diet and have SLP team follow, aspiration precautions  ckd In setting of her vascular disease- HTN, CHF and dm. Monitor renal function. Avoid NSAIDs  Anemia Likely from her CKD, monitor clinically  Peripheral neuropathy Continue neurontin for nerve pain 400 mg tid  gerd Stable on PPI  Schizophrenia Stable continue her zoloft and fluphenazine and monitor clinically  Labs- cbc,cmp  Family/ staff Communication: reviewed care plan with patient and nursing supervisor    Blanchie Serve, MD  [place] [phone] (Monday-Friday 8 am - 5 pm) [phone] (afterhours)

## 2014-01-31 ENCOUNTER — Non-Acute Institutional Stay (SKILLED_NURSING_FACILITY): Payer: PRIVATE HEALTH INSURANCE | Admitting: Internal Medicine

## 2014-01-31 DIAGNOSIS — N189 Chronic kidney disease, unspecified: Secondary | ICD-10-CM

## 2014-01-31 DIAGNOSIS — K219 Gastro-esophageal reflux disease without esophagitis: Secondary | ICD-10-CM

## 2014-01-31 DIAGNOSIS — I69959 Hemiplegia and hemiparesis following unspecified cerebrovascular disease affecting unspecified side: Secondary | ICD-10-CM

## 2014-01-31 DIAGNOSIS — I798 Other disorders of arteries, arterioles and capillaries in diseases classified elsewhere: Secondary | ICD-10-CM

## 2014-01-31 DIAGNOSIS — E1142 Type 2 diabetes mellitus with diabetic polyneuropathy: Secondary | ICD-10-CM

## 2014-02-08 ENCOUNTER — Other Ambulatory Visit: Payer: Self-pay | Admitting: Internal Medicine

## 2014-02-08 LAB — URINALYSIS, COMPLETE
BILIRUBIN, UR: NEGATIVE
GLUCOSE, UR: NEGATIVE mg/dL (ref 0–75)
KETONE: NEGATIVE
Nitrite: NEGATIVE
Ph: 8 (ref 4.5–8.0)
Protein: NEGATIVE
RBC,UR: 21 /HPF (ref 0–5)
SPECIFIC GRAVITY: 1.014 (ref 1.003–1.030)
Squamous Epithelial: 11
WBC UR: 254 /HPF (ref 0–5)

## 2014-02-11 LAB — URINE CULTURE

## 2014-02-28 ENCOUNTER — Non-Acute Institutional Stay (SKILLED_NURSING_FACILITY): Payer: PRIVATE HEALTH INSURANCE | Admitting: Internal Medicine

## 2014-02-28 ENCOUNTER — Encounter: Payer: Self-pay | Admitting: Internal Medicine

## 2014-02-28 DIAGNOSIS — N390 Urinary tract infection, site not specified: Secondary | ICD-10-CM

## 2014-02-28 DIAGNOSIS — N189 Chronic kidney disease, unspecified: Secondary | ICD-10-CM | POA: Insufficient documentation

## 2014-02-28 DIAGNOSIS — F209 Schizophrenia, unspecified: Secondary | ICD-10-CM

## 2014-02-28 DIAGNOSIS — E1142 Type 2 diabetes mellitus with diabetic polyneuropathy: Secondary | ICD-10-CM | POA: Insufficient documentation

## 2014-02-28 DIAGNOSIS — E559 Vitamin D deficiency, unspecified: Secondary | ICD-10-CM | POA: Insufficient documentation

## 2014-02-28 DIAGNOSIS — F205 Residual schizophrenia: Secondary | ICD-10-CM

## 2014-02-28 DIAGNOSIS — K219 Gastro-esophageal reflux disease without esophagitis: Secondary | ICD-10-CM | POA: Insufficient documentation

## 2014-02-28 DIAGNOSIS — I798 Other disorders of arteries, arterioles and capillaries in diseases classified elsewhere: Secondary | ICD-10-CM | POA: Insufficient documentation

## 2014-02-28 DIAGNOSIS — J449 Chronic obstructive pulmonary disease, unspecified: Secondary | ICD-10-CM

## 2014-02-28 DIAGNOSIS — I69959 Hemiplegia and hemiparesis following unspecified cerebrovascular disease affecting unspecified side: Secondary | ICD-10-CM | POA: Insufficient documentation

## 2014-02-28 NOTE — Progress Notes (Signed)
Patient ID: [name], female   DOB: 05-10-1926, 80 y.o.   MRN: [id number]    Facility: [facility]  Chief Complaint  Patient presents with  . Medical Management of Chronic Issues   No Known Allergies  HPI 80 y/o female patient is here for long term care. she is seen today for routine visit. No new concern from staff today. No falls reported. No new skin concern. No acute behavioral changes.   ROS Denies fever or chills Denies chest pain or dyspnea No skin concerns Appetite is fair Takes medication for constipation  Past Medical History  Diagnosis Date  . Anemia   . Arthritis   . COPD (chronic obstructive pulmonary disease)   . Chronic schizophrenia   . Dementia   . Depressive disorder   . DM (diabetes mellitus)   . Stroke   . Acute sinusitis   . Breast cancer   . Schizophrenia   . Osteoporosis   . Stenosis of lumbosacral spine    Medication reviewed. See [facility]  Physical exam BP 130/75  Pulse 70  Temp(Src) 98 F (36.7 C)  Resp 18  General- elderly female in no acute distress Head- atraumatic, normocephalic Cardiovascular- normal s1,s2, no murmurs/ rubs/ gallops, diminished distal pulses, no leg edema Respiratory- bilateral clear to auscultation, no wheeze, no rhonchi, no crackles Abdomen- bowel sounds present, soft, non tender Musculoskeletal- left sided hemiplegia, left hand contracted Neurological- no focal deficit new Skin- warm and dry Psychiatry- alert and oriented   Assessment/plan  Vit d def Continue vit d supplement  Schizophrenia Stable continue her prolixin and monitor clinically  Copd Stable currently, continue her bronchodilator  uti Completed rx for uti with levofloxacin. Currently asymptomatic

## 2014-02-28 NOTE — Progress Notes (Signed)
Patient ID: [name], female   DOB: 24-Apr-1926, 80 y.o.   MRN: [id number]    No Known Allergies  [facility]  Chief Complaint  Patient presents with  . Medical Management of Chronic Issues     HPI 80 y/o female patient is here for long term care and has no new concerns. She is alert and oriented. She has cva with left hemiplegia, copd, dm, htn, depression and chronic schizophrenia among others. she is seen today for routine visit.   Review of Systems  Constitutional: Negative for fever, chills, weight loss, malaise/fatigue and diaphoresis.  HENT: Negative for congestion, hearing loss and sore throat.   Eyes: Negative for blurred vision, double vision and discharge.  Respiratory: Negative for cough, sputum production, shortness of breath and wheezing.   Cardiovascular: Negative for chest pain, palpitations, orthopnea and leg swelling.  Gastrointestinal: Negative for heartburn, nausea, vomiting, abdominal pain Skin: Negative for itching and rash.   Psychiatric/Behavioral: The patient is not nervous/anxious.    Past Medical History  Diagnosis Date  . Anemia   . Arthritis   . COPD (chronic obstructive pulmonary disease)   . Chronic schizophrenia   . Dementia   . Depressive disorder   . DM (diabetes mellitus)   . Stroke   . Acute sinusitis   . Breast cancer   . Schizophrenia   . Osteoporosis   . Stenosis of lumbosacral spine    Current Outpatient Prescriptions on File Prior to Visit  Medication Sig Dispense Refill  . acetaminophen (TYLENOL) 500 MG tablet Take 500 mg by mouth 2 (two) times daily.      Marland Kitchen albuterol (PROVENTIL) (2.5 MG/3ML) 0.083% nebulizer solution Take 2.5 mg by nebulization 3 (three) times daily.      Marland Kitchen aspirin 325 MG tablet Take 325 mg by mouth daily.      . bisacodyl (DULCOLAX) 10 MG suppository Place 1 suppository (10 mg total) rectally daily as needed for moderate constipation.  12 suppository  0  . budesonide (PULMICORT) 0.5 MG/2ML  nebulizer solution Take 0.5 mg by nebulization 2 (two) times daily.      . cetirizine (ZYRTEC) 10 MG tablet Take 10 mg by mouth daily.      . cholecalciferol (VITAMIN D) 1000 UNITS tablet Take 2,000 Units by mouth daily.      . cycloSPORINE (RESTASIS) 0.05 % ophthalmic emulsion Place 1 drop into both eyes 2 (two) times daily.       Marland Kitchen donepezil (ARICEPT) 10 MG tablet Take 10 mg by mouth at bedtime.       Marland Kitchen FLUPHENAZINE DECANOATE IJ Inject 25 mg/mL as directed every 30 (thirty) days. Next shot due 01/02/14      . fluticasone (FLONASE) 50 MCG/ACT nasal spray Place 2 sprays into the nose daily.      Marland Kitchen gabapentin (NEURONTIN) 400 MG capsule Take 400 mg by mouth 3 (three) times daily.      Marland Kitchen HYDROcodone-acetaminophen (NORCO/VICODIN) 5-325 MG per tablet Take 0.5 tablets by mouth every 6 (six) hours as needed for moderate pain.  15 tablet  0  . ipratropium-albuterol (DUONEB) 0.5-2.5 (3) MG/3ML SOLN Take 3 mLs by nebulization every 6 (six) hours as needed (shortness of breath).       . lidocaine (LIDODERM) 5 % Place 1 patch onto the skin daily. 12 hours on 12 hours off      . linezolid (ZYVOX) 600 MG tablet Take 1 tablet (600 mg total) by mouth 2 (two) times daily.  For 5 more days from 12/23/13      . metFORMIN (GLUCOPHAGE) 500 MG tablet Take 500 mg by mouth daily.       Marland Kitchen olopatadine (PATANOL) 0.1 % ophthalmic solution Place 1 drop into both eyes 2 (two) times daily.       Marland Kitchen omeprazole (PRILOSEC) 40 MG capsule Take 40 mg by mouth 2 (two) times daily.      . polyethylene glycol (MIRALAX / GLYCOLAX) packet Take 17 g by mouth daily.      . pravastatin (PRAVACHOL) 40 MG tablet Take 40 mg by mouth daily.      Marland Kitchen senna (SENOKOT) 8.6 MG TABS tablet Take 2 tablets (17.2 mg total) by mouth at bedtime.  120 each  0  . sertraline (ZOLOFT) 50 MG tablet Take 50 mg by mouth daily.      . sodium phosphate (FLEET) 7-19 GM/118ML ENEM Place 1 enema rectally daily as needed for severe constipation.    0   No current  facility-administered medications on file prior to visit.   Physical exam General- elderly female in no acute distress Head- atraumatic, normocephalic Cardiovascular- normal s1,s2, no murmurs/ rubs/ gallops, diminished distal pulses, no leg edema Respiratory- bilateral clear to auscultation, no wheeze, no rhonchi, no crackles Abdomen- bowel sounds present, soft, non tender Musculoskeletal- left sided hemiplegia, left hand contracted Neurological- no focal deficit new Skin- warm and dry Psychiatry- alert and oriented   Assessment/plan  cva with left sided hemiplegia Continue aspirin and statin monitor bp readings. Skin care and pressure ulcer prophyalxis, encourage to be out of bed as tolerated. Fall precautions  ckd In setting of her vascular disease- HTN, CHF and dm. Monitor renal function. Avoid NSAIDs  Peripheral neuropathy Continue neurontin for nerve pain 400 mg tid  gerd Stable on PPI, try to wean her off it next visit if tolerated  Peripheral angiopathy Stable, continue aspirin  Schizophrenia Stable continue her prolixin and monitor clinically

## 2014-03-31 LAB — TSH: TSH: 2.24 u[IU]/mL (ref <=5.90)

## 2014-03-31 LAB — LIPID PANEL
Cholesterol: 130 mg/dL (ref 0–200)
HDL: 37 mg/dL (ref 35–70)
LDL Cholesterol: 69 mg/dL
Triglycerides: 120 mg/dL (ref 40–160)

## 2014-03-31 LAB — HEMOGLOBIN A1C: Hgb A1c MFr Bld: 5.8 % (ref 4.0–6.0)

## 2014-04-07 ENCOUNTER — Other Ambulatory Visit: Payer: Self-pay | Admitting: Urology

## 2014-04-07 ENCOUNTER — Telehealth: Payer: Self-pay | Admitting: Pulmonary Disease

## 2014-04-07 NOTE — Telephone Encounter (Signed)
Called spoke with daughter. appt scheduled to see RA Tuesday at 4:15. Nothing further needed

## 2014-04-11 ENCOUNTER — Other Ambulatory Visit (HOSPITAL_COMMUNITY): Payer: Self-pay | Admitting: *Deleted

## 2014-04-12 ENCOUNTER — Encounter: Payer: Self-pay | Admitting: Pulmonary Disease

## 2014-04-12 ENCOUNTER — Ambulatory Visit (INDEPENDENT_AMBULATORY_CARE_PROVIDER_SITE_OTHER): Payer: Medicare Other | Admitting: Pulmonary Disease

## 2014-04-12 ENCOUNTER — Encounter (HOSPITAL_COMMUNITY): Payer: Self-pay | Admitting: Pharmacy Technician

## 2014-04-12 VITALS — BP 130/82 | HR 67 | Ht 69.0 in | Wt 204.0 lb

## 2014-04-12 DIAGNOSIS — R339 Retention of urine, unspecified: Secondary | ICD-10-CM | POA: Insufficient documentation

## 2014-04-12 DIAGNOSIS — J449 Chronic obstructive pulmonary disease, unspecified: Secondary | ICD-10-CM

## 2014-04-12 NOTE — Patient Instructions (Signed)
Cleared for surgery with due risk (benefit outweighs risk) Give neb Rx pre- & post -op Portable CXR  Check Oxygen levels off O2

## 2014-04-12 NOTE — Assessment & Plan Note (Signed)
Stay on Pulmicort and DuoNeb's Check Oxygen levels off O2 - if saturation stays good, oxygen can perhaps be discontinued

## 2014-04-12 NOTE — Progress Notes (Signed)
   Subjective:    Patient ID: [name], female    DOB: 1926/06/26, 80 y.o.   MRN: [id number]  HPI PCP - [name] ([name])  ENT- [name]   80 year old ex-smoker, NHR, ( [name]'s mother ) for FU of COPD & cough.  INitial OV 2013 She reports chest tightness and hoarseness of voice since February 2013. ENT evaluation of her pharynx and larynx has been normal except for some atrophy of her vocal cords and the option of injection augmentation of the vocal folds was discussed. PPI therapy did not seem to help She is wheelchair-bound since her knee surgery 2011and left hemiplegia '96.  She also has a history of breast cancer and 97 treated with surgery.  She was unable to perform spirometry maneuver, best FEV1 was 0.87 (45%)  >> changed to pulmicort & albuterol nebs    04/12/2014  Chief Complaint  Patient presents with  . COPD    Needs surgical clearance for surgery 04/29/14--Breathing has impoved since last ov   She has developed severe spinal stenosis and urinary retention requiring indwelling Foley. She has had recurrent UTIs this year and one episode of hospitalization for sepsis. A suprapubic cystostomy is planned by Dr. Janice [name]. She presents for surgical clearance.she is noted to have a UTI, and Levaquin has been prescribed. She is maintained on oxygen after her hospitalization in 12/2013. Oxygen saturation however is 95% on room air. Portable Chest x-ray from 12/2013 shows cardiomegaly and poorly vascular prominence.  Past Surgical History  Procedure Laterality Date  . Appendectomy    . Breast surgery      breast cancer  . Left knee replacement    . Cataract extraction, bilateral    . Cholecystectomy       Review of Systems neg for any significant sore throat, dysphagia, itching, sneezing, nasal congestion or excess/ purulent secretions, fever, chills, sweats, unintended wt loss, pleuritic or exertional cp, hempoptysis, orthopnea pnd or change in chronic leg swelling. Also denies  presyncope, palpitations, heartburn, abdominal pain, nausea, vomiting, diarrhea or change in bowel or urinary habits, dysuria,hematuria, rash, arthralgias, visual complaints, headache, numbness weakness or ataxia.     Objective:   Physical Exam  Gen. Pleasant, well-nourished, in no distress,in wheelchair ENT - no lesions, no post nasal drip Neck: No JVD, no thyromegaly, no carotid bruits Lungs: no use of accessory muscles, no dullness to percussion, clear without rales or rhonchi  Cardiovascular: Rhythm regular, heart sounds  normal, no murmurs or gallops, no peripheral edema Musculoskeletal: No deformities, no cyanosis or clubbing        Assessment & Plan:

## 2014-04-12 NOTE — Assessment & Plan Note (Signed)
Cleared for suprapubic cystostomy with due risk (benefit outweighs risk) Although spinal anesthesia would be preferred, I understand that this may be difficult due to her history of spinal stenosis. Give neb Rx pre- & post -op Portable CXR

## 2014-04-13 ENCOUNTER — Encounter (HOSPITAL_COMMUNITY): Payer: Self-pay | Admitting: *Deleted

## 2014-04-14 ENCOUNTER — Non-Acute Institutional Stay (SKILLED_NURSING_FACILITY): Payer: PRIVATE HEALTH INSURANCE | Admitting: Internal Medicine

## 2014-04-14 ENCOUNTER — Encounter (HOSPITAL_COMMUNITY): Payer: Self-pay | Admitting: *Deleted

## 2014-04-14 DIAGNOSIS — F209 Schizophrenia, unspecified: Secondary | ICD-10-CM

## 2014-04-14 DIAGNOSIS — N3000 Acute cystitis without hematuria: Secondary | ICD-10-CM

## 2014-04-14 DIAGNOSIS — I509 Heart failure, unspecified: Secondary | ICD-10-CM

## 2014-04-14 DIAGNOSIS — N039 Chronic nephritic syndrome with unspecified morphologic changes: Secondary | ICD-10-CM

## 2014-04-14 DIAGNOSIS — F0150 Vascular dementia without behavioral disturbance: Secondary | ICD-10-CM

## 2014-04-14 DIAGNOSIS — E1142 Type 2 diabetes mellitus with diabetic polyneuropathy: Secondary | ICD-10-CM

## 2014-04-14 DIAGNOSIS — K219 Gastro-esophageal reflux disease without esophagitis: Secondary | ICD-10-CM

## 2014-04-14 DIAGNOSIS — R339 Retention of urine, unspecified: Secondary | ICD-10-CM

## 2014-04-14 DIAGNOSIS — F0151 Vascular dementia with behavioral disturbance: Secondary | ICD-10-CM

## 2014-04-14 DIAGNOSIS — E785 Hyperlipidemia, unspecified: Secondary | ICD-10-CM

## 2014-04-14 DIAGNOSIS — E1149 Type 2 diabetes mellitus with other diabetic neurological complication: Secondary | ICD-10-CM | POA: Insufficient documentation

## 2014-04-14 DIAGNOSIS — I130 Hypertensive heart and chronic kidney disease with heart failure and stage 1 through stage 4 chronic kidney disease, or unspecified chronic kidney disease: Secondary | ICD-10-CM

## 2014-04-14 NOTE — Progress Notes (Signed)
Faxed instruction sheet and map to [facility] center, also spoke with pt's daughter who will be with her.

## 2014-04-14 NOTE — Progress Notes (Signed)
Patient ID: [name], female   DOB: [birth year], 80 y.o.   MRN: [id number]    Facility: [facility]  Chief Complaint  Patient presents with  . Medical Management of Chronic Issues   Allergies  Allergen Reactions  . Lisinopril     Per MAR   HPI 80 y/o female patient is here for long term care. She is at her baseline. Her behavior has been stable. Tolerating prolixin well. bp stable, chf compensated. Reviewed pulmonary note. She has been cleared for suprapubic cystostomy and has surgery next week. Reflux under control. Denies any complaints today.   Review of Systems  Constitutional: Negative for fever, chills, weight loss, malaise/fatigue and diaphoresis.  HENT: Negative for congestion, hearing loss and sore throat.   Respiratory: Negative for cough, sputum production, shortness of breath and wheezing.  off oxygen Cardiovascular: Negative for chest pain, palpitations, orthopnea and leg swelling.  Gastrointestinal: Negative for heartburn, nausea, vomiting, abdominal pain Skin: Negative for itching and rash.  Psychiatric/Behavioral: has vascular dementia and schizophrenia at baseline  Past Medical History  Diagnosis Date  . Anemia   . Arthritis   . COPD (chronic obstructive pulmonary disease)   . Chronic schizophrenia   . Dementia   . Depressive disorder   . Acute sinusitis   . Breast cancer   . Schizophrenia   . Osteoporosis   . Stenosis of lumbosacral spine   . Glaucoma   . On home oxygen therapy     2 L PER NASAL CANNULA  . GERD (gastroesophageal reflux disease)   . Stroke     L hemiparesis  . Cervical spondylosis   . DM (diabetes mellitus)     DIET CONTROLLED  . Chronic kidney disease     chronic kidney disease per MD notes  . CHF (congestive heart failure)     chronic and stable per MD note  . Hyperlipidemia   . Hypertension   . Foley catheter in place   . Wheelchair dependent    Current Outpatient Prescriptions on  File Prior to Visit  Medication Sig Dispense Refill  . acetaminophen (TYLENOL) 500 MG tablet Take 500 mg by mouth 3 (three) times daily.       Marland Kitchen albuterol (PROVENTIL) (2.5 MG/3ML) 0.083% nebulizer solution Take 2.5 mg by nebulization 3 (three) times daily.      Marland Kitchen aspirin 81 MG chewable tablet Chew 81 mg by mouth daily.      . bisacodyl (DULCOLAX) 10 MG suppository Place 1 suppository (10 mg total) rectally daily as needed for moderate constipation.  12 suppository  0  . budesonide (PULMICORT) 0.5 MG/2ML nebulizer solution Take 0.5 mg by nebulization 2 (two) times daily.      . cholecalciferol (VITAMIN D) 1000 UNITS tablet Take 2,000 Units by mouth daily.      . Cyanocobalamin (VITAMIN B-12) 1000 MCG SUBL Place 1,000 mcg under the tongue daily.      . cycloSPORINE (RESTASIS) 0.05 % ophthalmic emulsion Place 1 drop into both eyes 2 (two) times daily.       Marland Kitchen donepezil (ARICEPT) 10 MG tablet Take 10 mg by mouth at bedtime.       Marland Kitchen FLUPHENAZINE DECANOATE IJ Inject 25 mg/mL as directed every 30 (thirty) days. Once a month on the 19th.      . fluticasone (FLONASE) 50 MCG/ACT nasal spray Place 2 sprays into the nose daily.      Marland Kitchen gabapentin (NEURONTIN) 300 MG capsule Take  300 mg by mouth at bedtime.      Marland Kitchen HYDROcodone-acetaminophen (NORCO/VICODIN) 5-325 MG per tablet Take 0.5 tablets by mouth every 6 (six) hours as needed for moderate pain.  15 tablet  0  . ipratropium-albuterol (DUONEB) 0.5-2.5 (3) MG/3ML SOLN Take 3 mLs by nebulization every 6 (six) hours as needed (shortness of breath).       Marland Kitchen olopatadine (PATANOL) 0.1 % ophthalmic solution Place 1 drop into both eyes 2 (two) times daily.       Marland Kitchen omeprazole (PRILOSEC) 40 MG capsule Take 40 mg by mouth 2 (two) times daily.      . polyethylene glycol (MIRALAX / GLYCOLAX) packet Take 17 g by mouth daily.      . Pramoxine HCl (VAGISIL MAXIMUM STRENGTH) 1 % MISC Apply 1 application topically daily.      . sennosides-docusate sodium (SENOKOT-S) 8.6-50 MG  tablet Take 2 tablets by mouth at bedtime.      . sertraline (ZOLOFT) 50 MG tablet Take 50 mg by mouth every morning.       . sodium phosphate (FLEET) 7-19 GM/118ML ENEM Place 1 enema rectally daily as needed for severe constipation.    0  . zinc oxide (BALMEX) 11.3 % CREA cream Apply 1 application topically 2 (two) times daily. Applied to buttocks every shift for redness       No current facility-administered medications on file prior to visit.    Physical exam BP 119/66  Pulse 70  Temp(Src) 98.1 F (36.7 C)  Resp 18  SpO2 99%  General- elderly female in no acute distress Head- atraumatic, normocephalic Eyes- no pallor, no icterus, no discharge Neck- no lymphadenopathy Mouth- normal mucus membrane Cardiovascular- normal s1,s2, no murmurs, diminished distal pulses, no leg edema Respiratory- bilateral clear to auscultation, no wheeze, no rhonchi, no crackles Abdomen- bowel sounds present, soft, non tender, has foley catheter Musculoskeletal- left hemiplegia, left hand and elbow contracted, OA chnages in digits, hoyer transfer and propelled in wheelchair Skin- warm and dry Psychiatry- alert and conversational  Labs 03/02/14 hb 10.6, hct 35.9, plt 171, ca 9.9, na 141, k 3.8, cl 105, bun 12, cr 0.8, glu 82  Assessment/plan  uti Continue her levaquin and complete 5 days course  Urinary retention Has foley at present. Pt to get suprapubic catheterization next week. To hold aspirin on 04/24/14 for surgery  gerd Continue omeprazole 40 mg bid for now  Dm with neurological manifestations a1c 6 in 4/15. Off ACEI and satin due to side effects. Off all hypoglycemic agent with a1c at goal  Diabetic neuropathy Continue neurontin and monitor  HTN Off all bp meds, monitor clinically  chf Off all meds, monitor clinically, monitor weight and symptoms  Schizophrenia Continue fluphenazine injection and monitor. Continue zoloft for depression  Hyperlipidemia Off statin and monitor  clinically  Copd Continue pulmicort, ventolin and flonase with prn duoneb  Vascular dementia Stable, continue aricept for now

## 2014-04-25 ENCOUNTER — Encounter (HOSPITAL_COMMUNITY): Payer: Self-pay | Admitting: *Deleted

## 2014-04-28 NOTE — Progress Notes (Signed)
[place] spoke with Osvaldo Human pre op instructions were received and placed on the patient's chart without questions.

## 2014-04-28 NOTE — Progress Notes (Signed)
1525 Re-faxed  pre op instructions back to Osvaldo Human at [facility].  Received a call asking for pre op instructions.

## 2014-04-29 ENCOUNTER — Encounter (HOSPITAL_COMMUNITY): Payer: Medicare Other | Admitting: Anesthesiology

## 2014-04-29 ENCOUNTER — Ambulatory Visit (HOSPITAL_COMMUNITY): Payer: Medicare Other

## 2014-04-29 ENCOUNTER — Encounter (HOSPITAL_COMMUNITY)
Admission: RE | Disposition: A | Discharge status: Skilled Nursing Facility | Payer: Self-pay | Source: Ambulatory Visit | Attending: Urology

## 2014-04-29 ENCOUNTER — Ambulatory Visit (HOSPITAL_COMMUNITY)
Admission: RE | Admit: 2014-04-29 | Discharge: 2014-04-29 | Disposition: A | Discharge status: Skilled Nursing Facility | Payer: Medicare Other | Source: Ambulatory Visit | Attending: Urology | Admitting: Urology

## 2014-04-29 ENCOUNTER — Encounter (HOSPITAL_COMMUNITY): Payer: Self-pay | Admitting: *Deleted

## 2014-04-29 ENCOUNTER — Ambulatory Visit (HOSPITAL_COMMUNITY): Payer: Medicare Other | Admitting: Anesthesiology

## 2014-04-29 DIAGNOSIS — N390 Urinary tract infection, site not specified: Secondary | ICD-10-CM | POA: Diagnosis not present

## 2014-04-29 DIAGNOSIS — Z7982 Long term (current) use of aspirin: Secondary | ICD-10-CM | POA: Insufficient documentation

## 2014-04-29 DIAGNOSIS — F0390 Unspecified dementia without behavioral disturbance: Secondary | ICD-10-CM | POA: Insufficient documentation

## 2014-04-29 DIAGNOSIS — M48061 Spinal stenosis, lumbar region without neurogenic claudication: Secondary | ICD-10-CM | POA: Diagnosis not present

## 2014-04-29 DIAGNOSIS — R339 Retention of urine, unspecified: Secondary | ICD-10-CM | POA: Insufficient documentation

## 2014-04-29 DIAGNOSIS — J4489 Other specified chronic obstructive pulmonary disease: Secondary | ICD-10-CM | POA: Insufficient documentation

## 2014-04-29 DIAGNOSIS — Z79899 Other long term (current) drug therapy: Secondary | ICD-10-CM | POA: Diagnosis not present

## 2014-04-29 DIAGNOSIS — J449 Chronic obstructive pulmonary disease, unspecified: Secondary | ICD-10-CM | POA: Insufficient documentation

## 2014-04-29 DIAGNOSIS — N3289 Other specified disorders of bladder: Secondary | ICD-10-CM | POA: Diagnosis not present

## 2014-04-29 DIAGNOSIS — E119 Type 2 diabetes mellitus without complications: Secondary | ICD-10-CM | POA: Insufficient documentation

## 2014-04-29 DIAGNOSIS — Z96659 Presence of unspecified artificial knee joint: Secondary | ICD-10-CM | POA: Insufficient documentation

## 2014-04-29 DIAGNOSIS — Z87891 Personal history of nicotine dependence: Secondary | ICD-10-CM | POA: Diagnosis not present

## 2014-04-29 DIAGNOSIS — I509 Heart failure, unspecified: Secondary | ICD-10-CM | POA: Diagnosis not present

## 2014-04-29 DIAGNOSIS — F205 Residual schizophrenia: Secondary | ICD-10-CM | POA: Diagnosis not present

## 2014-04-29 DIAGNOSIS — F3289 Other specified depressive episodes: Secondary | ICD-10-CM | POA: Insufficient documentation

## 2014-04-29 DIAGNOSIS — Z8673 Personal history of transient ischemic attack (TIA), and cerebral infarction without residual deficits: Secondary | ICD-10-CM | POA: Diagnosis not present

## 2014-04-29 DIAGNOSIS — F329 Major depressive disorder, single episode, unspecified: Secondary | ICD-10-CM | POA: Diagnosis not present

## 2014-04-29 DIAGNOSIS — Z90710 Acquired absence of both cervix and uterus: Secondary | ICD-10-CM | POA: Insufficient documentation

## 2014-04-29 DIAGNOSIS — E785 Hyperlipidemia, unspecified: Secondary | ICD-10-CM | POA: Diagnosis not present

## 2014-04-29 DIAGNOSIS — M129 Arthropathy, unspecified: Secondary | ICD-10-CM | POA: Diagnosis not present

## 2014-04-29 HISTORY — DX: Unspecified glaucoma: H40.9

## 2014-04-29 HISTORY — DX: Dependence on supplemental oxygen: Z99.81

## 2014-04-29 HISTORY — PX: INSERTION OF SUPRAPUBIC CATHETER: SHX5870

## 2014-04-29 HISTORY — DX: Dependence on wheelchair: Z99.3

## 2014-04-29 HISTORY — DX: Heart failure, unspecified: I50.9

## 2014-04-29 HISTORY — DX: Essential (primary) hypertension: I10

## 2014-04-29 HISTORY — DX: Spondylosis without myelopathy or radiculopathy, cervical region: M47.812

## 2014-04-29 HISTORY — DX: Presence of urogenital implants: Z96.0

## 2014-04-29 HISTORY — DX: Presence of other specified devices: Z97.8

## 2014-04-29 HISTORY — DX: Hyperlipidemia, unspecified: E78.5

## 2014-04-29 HISTORY — DX: Chronic kidney disease, unspecified: N18.9

## 2014-04-29 HISTORY — DX: Gastro-esophageal reflux disease without esophagitis: K21.9

## 2014-04-29 LAB — CBC
HEMATOCRIT: 34.6 % — AB (ref 36.0–46.0)
Hemoglobin: 10.6 g/dL — ABNORMAL LOW (ref 12.0–15.0)
MCH: 26.4 pg (ref 26.0–34.0)
MCHC: 30.6 g/dL (ref 30.0–36.0)
MCV: 86.1 fL (ref 78.0–100.0)
Platelets: 179 10*3/uL (ref 150–400)
RBC: 4.02 MIL/uL (ref 3.87–5.11)
RDW: 16.3 % — ABNORMAL HIGH (ref 11.5–15.5)
WBC: 4.9 10*3/uL (ref 4.0–10.5)

## 2014-04-29 LAB — BASIC METABOLIC PANEL
Anion gap: 9 (ref 5–15)
BUN: 11 mg/dL (ref 6–23)
CO2: 29 meq/L (ref 19–32)
Calcium: 10.6 mg/dL — ABNORMAL HIGH (ref 8.4–10.5)
Chloride: 104 mEq/L (ref 96–112)
Creatinine, Ser: 0.76 mg/dL (ref 0.50–1.10)
GFR calc Af Amer: 86 mL/min — ABNORMAL LOW (ref >90)
GFR calc non Af Amer: 74 mL/min — ABNORMAL LOW (ref >90)
GLUCOSE: 92 mg/dL (ref 70–99)
POTASSIUM: 4.1 meq/L (ref 3.7–5.3)
SODIUM: 142 meq/L (ref 137–147)

## 2014-04-29 LAB — GLUCOSE, CAPILLARY
GLUCOSE-CAPILLARY: 100 mg/dL — AB (ref 70–99)
Glucose-Capillary: 92 mg/dL (ref 70–99)

## 2014-04-29 SURGERY — INSERTION, SUPRAPUBIC CATHETER
Anesthesia: Monitor Anesthesia Care | Site: Abdomen

## 2014-04-29 MED ORDER — FENTANYL CITRATE 0.05 MG/ML IJ SOLN
INTRAMUSCULAR | Status: AC
  Filled 2014-04-29: qty 2

## 2014-04-29 MED ORDER — BUPIVACAINE HCL (PF) 0.5 % IJ SOLN
INTRAMUSCULAR | Status: AC
  Filled 2014-04-29: qty 30

## 2014-04-29 MED ORDER — OXYCODONE HCL 5 MG PO TABS: 5.0000 mg | ORAL_TABLET | Freq: Once | ORAL | Status: DC | PRN

## 2014-04-29 MED ORDER — MEPERIDINE HCL 50 MG/ML IJ SOLN: 6.2500 mg | INTRAMUSCULAR | Status: DC | PRN

## 2014-04-29 MED ORDER — HYDROCODONE-ACETAMINOPHEN 5-325 MG PO TABS
0.5000 | ORAL_TABLET | Freq: Once | ORAL | Status: AC
  Administered 2014-04-29: 0.5 via ORAL
  Filled 2014-04-29: qty 1

## 2014-04-29 MED ORDER — HYDROMORPHONE HCL PF 1 MG/ML IJ SOLN: 0.2500 mg | INTRAMUSCULAR | Status: DC | PRN

## 2014-04-29 MED ORDER — SODIUM CHLORIDE 0.9 % IR SOLN
Status: DC | PRN
  Administered 2014-04-29: 3000 mL

## 2014-04-29 MED ORDER — FENTANYL CITRATE 0.05 MG/ML IJ SOLN
INTRAMUSCULAR | Status: DC | PRN
  Administered 2014-04-29: 25 ug via INTRAVENOUS

## 2014-04-29 MED ORDER — CIPROFLOXACIN IN D5W 400 MG/200ML IV SOLN
INTRAVENOUS | Status: AC
  Filled 2014-04-29: qty 200

## 2014-04-29 MED ORDER — PROPOFOL 10 MG/ML IV BOLUS
INTRAVENOUS | Status: AC
  Filled 2014-04-29: qty 20

## 2014-04-29 MED ORDER — LIDOCAINE HCL (CARDIAC) 20 MG/ML IV SOLN
INTRAVENOUS | Status: DC | PRN
  Administered 2014-04-29: 100 mg via INTRAVENOUS

## 2014-04-29 MED ORDER — PROMETHAZINE HCL 25 MG/ML IJ SOLN: 6.2500 mg | INTRAMUSCULAR | Status: DC | PRN

## 2014-04-29 MED ORDER — CIPROFLOXACIN IN D5W 400 MG/200ML IV SOLN
400.0000 mg | INTRAVENOUS | Status: AC
Start: 2014-04-29 — End: 2014-04-29
  Administered 2014-04-29: 400 mg via INTRAVENOUS

## 2014-04-29 MED ORDER — 0.9 % SODIUM CHLORIDE (POUR BTL) OPTIME
TOPICAL | Status: DC | PRN
  Administered 2014-04-29: 1000 mL

## 2014-04-29 MED ORDER — PROPOFOL INFUSION 10 MG/ML OPTIME
INTRAVENOUS | Status: DC | PRN
  Administered 2014-04-29: 75 ug/kg/min via INTRAVENOUS

## 2014-04-29 MED ORDER — LACTATED RINGERS IV SOLN
INTRAVENOUS | Status: DC
  Administered 2014-04-29: 1000 mL via INTRAVENOUS

## 2014-04-29 MED ORDER — ONDANSETRON HCL 4 MG/2ML IJ SOLN
INTRAMUSCULAR | Status: AC
  Filled 2014-04-29: qty 2

## 2014-04-29 MED ORDER — OXYCODONE HCL 5 MG/5ML PO SOLN
5.0000 mg | Freq: Once | ORAL | Status: DC | PRN
  Filled 2014-04-29: qty 5

## 2014-04-29 MED ORDER — LIDOCAINE HCL (CARDIAC) 20 MG/ML IV SOLN
INTRAVENOUS | Status: AC
  Filled 2014-04-29: qty 5

## 2014-04-29 SURGICAL SUPPLY — 35 items
BAG URINE DRAINAGE (UROLOGICAL SUPPLIES) IMPLANT
BAG URINE LEG 500ML (DRAIN) IMPLANT
BAG URO CATCHER STRL LF (DRAPE) ×3 IMPLANT
BLADE SURG 15 STRL LF DISP TIS (BLADE) ×1 IMPLANT
BLADE SURG 15 STRL SS (BLADE) ×2
BLADE SURG ROTATE 9660 (MISCELLANEOUS) IMPLANT
CATH FOLEY 2WAY SLVR  5CC 18FR (CATHETERS) ×2
CATH FOLEY 2WAY SLVR 5CC 18FR (CATHETERS) ×1 IMPLANT
CLOTH BEACON ORANGE TIMEOUT ST (SAFETY) ×3 IMPLANT
COVER SURGICAL LIGHT HANDLE (MISCELLANEOUS) IMPLANT
ELECT REM PT RETURN 9FT ADLT (ELECTROSURGICAL)
ELECTRODE REM PT RTRN 9FT ADLT (ELECTROSURGICAL) IMPLANT
GLOVE BIOGEL M 7.0 STRL (GLOVE) ×3 IMPLANT
GLOVE BIOGEL PI IND STRL 6.5 (GLOVE) ×1 IMPLANT
GLOVE BIOGEL PI INDICATOR 6.5 (GLOVE) ×2
GLOVE SURG SS PI 6.5 STRL IVOR (GLOVE) ×3 IMPLANT
GOWN STRL REUS W/TWL LRG LVL3 (GOWN DISPOSABLE) ×6 IMPLANT
IV NS IRRIG 3000ML ARTHROMATIC (IV SOLUTION) ×3 IMPLANT
KIT SUPRAPUBIC CATH (MISCELLANEOUS) IMPLANT
MANIFOLD NEPTUNE II (INSTRUMENTS) ×3 IMPLANT
MARKER SKIN DUAL TIP RULER LAB (MISCELLANEOUS) ×3 IMPLANT
NEEDLE HYPO 22GX1.5 SAFETY (NEEDLE) ×3 IMPLANT
NS IRRIG 1000ML POUR BTL (IV SOLUTION) ×3 IMPLANT
PACK CYSTO (CUSTOM PROCEDURE TRAY) ×3 IMPLANT
PENCIL BUTTON HOLSTER BLD 10FT (ELECTRODE) IMPLANT
PLUG CATH AND CAP STER (CATHETERS) IMPLANT
SPONGE DRAIN TRACH 4X4 STRL 2S (GAUZE/BANDAGES/DRESSINGS) ×3 IMPLANT
SUT SILK 2 0 30  PSL (SUTURE) ×2
SUT SILK 2 0 30 PSL (SUTURE) ×1 IMPLANT
SYR CONTROL 10ML LL (SYRINGE) ×3 IMPLANT
SYRINGE IRR TOOMEY STRL 70CC (SYRINGE) ×3 IMPLANT
TOWEL OR 17X26 10 PK STRL BLUE (TOWEL DISPOSABLE) ×3 IMPLANT
TUBING CONNECTING 10 (TUBING) ×2 IMPLANT
TUBING CONNECTING 10' (TUBING) ×1
WATER STERILE IRR 500ML POUR (IV SOLUTION) ×3 IMPLANT

## 2014-04-29 NOTE — H&P (Signed)
Chief Complaint Urinary retention, recurrent UTIs   History of Present Illness 80yF nursing home resident, bedbound due to CVA and lumbar stenosis with indwelling foley since April 2015. She had developed urinary retention of 600cc with recurrent UTIs with 4 symptomatic UTIs over the past year. (Her symptoms are decreased mental status, fevers, and cloudy/foul smelling urine). A foley catheter was placed, and has been exchanged at the nursing facility. She has had about a UTI every month since foley catheter insertion, which the family attributes to perineal soiling. They are interested in SPT as a means to improve this. The primary caregiver and daughter is an experienced nurse.    Notably, she was on ditropan in the distant past for OAB following her stroke, but this was discontinued for perioperative hypotension after her knee replacement in 2005.   Past Medical History Problems  1. History of Chronic schizophrenia (295.62) 2. History of Deficiency anemia (281.9) 3. History of Dementia (294.20) 4. History of anemia (V12.3) 5. History of arthritis (V13.4) 6. History of breast cancer (V10.3) 7. History of chronic obstructive lung disease (V12.69) 8. History of congestive heart disease (V12.59) 9. History of depression (V11.8) 10. History of diabetes mellitus (V12.29) 11. History of hyperlipidemia (V12.29) 12. History of kidney disease (V13.09) 13. History of stroke (V12.54) 14. History of type 2 diabetes mellitus (V12.29)  Surgical History Problems  1. History of Breast Surgery 2. History of Hysterectomy 3. History of Knee Replacement  Current Meds 1. Albuterol Sulfate (2.5 MG/3ML) 0.083% Inhalation Nebulization Solution;  Therapy: (Recorded:21Apr2015) to Recorded 2. Aspirin 81 MG Oral Tablet Chewable;  Therapy: (Recorded:21Apr2015) to Recorded 3. Balmex 11.3 % External Cream;  Therapy: (Recorded:21Apr2015) to Recorded 4. Biscolax SUPP;  Therapy: (Recorded:21Apr2015) to  Recorded 5. Budesonide 0.5 MG/2ML Inhalation Suspension;  Therapy: (Recorded:21Apr2015) to Recorded 6. Donepezil HCl - 10 MG Oral Tablet;  Therapy: (Recorded:21Apr2015) to Recorded 7. FluPHENAZine HCl - 2.5 MG Oral Tablet;  Therapy: (Recorded:21Apr2015) to Recorded 8. Gabapentin 400 MG Oral Capsule;  Therapy: (Recorded:21Apr2015) to Recorded 9. Hydrocodone-Acetaminophen 5-325 MG Oral Tablet;  Therapy: (Recorded:21Apr2015) to Recorded 10. Ipratropium Bromide 0.02 % Inhalation Solution;   Therapy: (Recorded:21Apr2015) to Recorded 11. Mapap 500 MG Oral Tablet;   Therapy: (Recorded:23Jul2015) to Recorded 12. Omeprazole 40 MG Oral Capsule Delayed Release;   Therapy: (Recorded:21Apr2015) to Recorded 13. Patanol 0.1 % Ophthalmic Solution;   Therapy: (Recorded:21Apr2015) to Recorded 14. Restasis 0.05 % Ophthalmic Emulsion;   Therapy: (Recorded:21Apr2015) to Recorded 15. Senna-Docusate Sodium TABS;   Therapy: (Recorded:21Apr2015) to Recorded 16. Sertraline HCl - 50 MG Oral Tablet;   Therapy: (Recorded:21Apr2015) to Recorded 17. Vitamin B12 TABS;   Therapy: (Recorded:21Apr2015) to Recorded 18. Vitamin D3 CAPS;   Therapy: (Recorded:21Apr2015) to Recorded  Allergies Medication  1. Lisinopril TABS 2. Ultram TABS  Family History Problems  1. Family history of Death of family member : Mother, Father   Mother at age 53; strokeFather at age 55; heart attack  Social History Problems  1. Denied: History of Alcohol use 2. Caffeine use (V49.89)   2 per day 3. Former smoker [place])   Smoked 1/2 ppd for 20 years; quit in 1999 4. Number of children   2 daughters 5. Retired 51. Single  Review of Systems  Genitourinary: no urinary frequency, no urinary urgency, no dysuria, no nocturia and no incontinence.  Gastrointestinal: no nausea and no vomiting.  Constitutional: no fever and no night sweats.  Integumentary: no new skin rashes or lesions.  Eyes: no blurred vision.  ENT: no  sore throat.  Cardiovascular: no leg swelling.  Respiratory: no cough.  Endocrine: no polydipsia.  Musculoskeletal: no back pain.    Vitals Vital Signs [Data Includes: Last 1 Day]  Recorded: 23Jul2015 09:59AM  Height: 5 ft 9 in Weight: 204 lb  BMI Calculated: 30.13 BSA Calculated: 2.08 Blood Pressure: 123 / 74 Temperature: 98 F Heart Rate: 58  Physical Exam Constitutional:. No acute distress. Wheelchair bound. Wearing supplemental oxygen. Alert and conversant, but her daughter speaks for her the majority of the interview.  ENT:. The ears and nose are normal in appearance.  Neck: The appearance of the neck is normal.  Pulmonary: No respiratory distress.  Cardiovascular: Heart rate and rhythm are normal.  Abdomen: The abdomen is soft and nontender. No masses are palpated. No CVA tenderness. No hernias are palpable. No hepatosplenomegaly noted.  Genitourinary:. 14Fr foley in place draining clear yellow urine.   Lymphatics: The femoral and inguinal nodes are not enlarged or tender.  Skin: Normal skin turgor, no visible rash and no visible skin lesions.  Neuro/Psych:. Mood and affect are appropriate.    Assessment Assessed  1. Urinary retention (788.20) 2. Urinary tract infection (599.0)  80yF with urinary retention likely from lumbar stenosis and recurrent UTIs possibly from perineal soiling. Will plan on SPT. Her daughter is a Marine scientist, and is interested in learning how to change this. She has oxygen dependent COPD and is on primary prophylactic ASA. Will send urine today to clear asymptomatic bacteriuria, refer to pulmonologist for periop clearance, and stop her aspirin. Once done, will plan on SPT placement in OR.    Patient and family agree to proceed.

## 2014-04-29 NOTE — Anesthesia Preprocedure Evaluation (Addendum)
Anesthesia Evaluation  Patient identified by MRN, date of birth, ID band Patient awake    Reviewed: Allergy & Precautions, H&P , NPO status , Patient's Chart, lab work & pertinent test results  Airway Mallampati: II TM Distance: >3 FB Neck ROM: Full    Dental no notable dental hx.    Pulmonary COPD COPD inhaler, former smoker,  breath sounds clear to auscultation  Pulmonary exam normal       Cardiovascular hypertension, Pt. on medications + Peripheral Vascular Disease and +CHF Rhythm:Regular Rate:Normal     Neuro/Psych PSYCHIATRIC DISORDERS Depression Schizophrenia CVA, Residual Symptoms    GI/Hepatic Neg liver ROS, GERD-  Medicated,  Endo/Other  diabetes, Type 2  Renal/GU CRFRenal disease     Musculoskeletal negative musculoskeletal ROS (+)   Abdominal   Peds  Hematology negative hematology ROS (+) anemia ,   Anesthesia Other Findings   Reproductive/Obstetrics negative OB ROS                         Anesthesia Physical Anesthesia Plan  ASA: III  Anesthesia Plan: MAC   Post-op Pain Management:    Induction: Intravenous  Airway Management Planned:   Additional Equipment:   Intra-op Plan:   Post-operative Plan:   Informed Consent: I have reviewed the patients History and Physical, chart, labs and discussed the procedure including the risks, benefits and alternatives for the proposed anesthesia with the patient or authorized representative who has indicated his/her understanding and acceptance.   Dental advisory given  Plan Discussed with: CRNA  Anesthesia Plan Comments:         Anesthesia Quick Evaluation

## 2014-04-29 NOTE — Progress Notes (Signed)
Called [facility] for transportation back to [facility] . Report called to Pryor Montes RN at [facility] after suprapubic tube placement.

## 2014-04-29 NOTE — Op Note (Signed)
[name] is a 80 y.o.   04/29/2014  Monitor Anesthesia Care  Preop diagnosis: Urinary retention, urinary tract infection, lumbar stenosis  Postop diagnosis: Same  Procedure done: Cystoscopy, insertion of suprapubic tube  Surgeon: Charlene Brooke. Kaj Vasil  Anesthesia: Monitored anesthesia care  Indication: Patient is an 80 years old female bed bound who resides in a nursing home. She has an indwelling Foley catheter that was inserted for urinary retention. She has had urinary tract infections every month since, which the family attributes to perineal soiling. They are interested in an SP tube. They understand that she will still have urinary tract infection. However his daughter who is a nurse states that it did will be easier  for them to take care of  the suprapubic tube. She is scheduled for SP tube insertion  Procedure: The patient was identified by her wrist band and proper timeout was taken.  Under monitored anesthesia care she was prepped and draped and placed in the dorsolithotomy position. A cystoscope was inserted in the bladder. The bladder mucosa is reddened. There are several debris in the bladder that were irrigated out. The bladder is moderately trabeculated. There is no stone or tumor in the bladder the cystoscope was removed. A Lowsley sound was passed  in the bladder. The patient was then placed in Trendelenburg position. The anterior wall of the bladder was then tented. An incision was made in the suprapubic area over the Lowsley sound. The suprapubic incision was carried down to the tip of the sound. And the sound was passedthrough the suprapubic incision. A #18 French Foley catheter was then passed through the jaws of the sound and pulled in the bladder and through the urethra. The cystoscope was then passed through the urethra. The Foley was then gently pulled back under direct vision. When the tip of the Foley was in the  bladder the balloon of the Foley was inflated with 10 cc of  water. Clear urine drained out of the Foley catheter. The cystoscope was then removed. The Foley was secured to the incision with #2-0 silk. Sterile dressing was applied to the wound.  The patient tolerated the procedure well and left the OR in satisfactory condition to postanesthesia care unit.  EBL: Minimal  Needles, sponges count: Correct

## 2014-04-29 NOTE — Transfer of Care (Signed)
Immediate Anesthesia Transfer of Care Note  Patient: [name]  Procedure(s) Performed: Procedure(s): INSERTION OF SUPRAPUBIC CATHETER (N/A)  Patient Location: PACU  Anesthesia Type:MAC  Level of Consciousness: awake, alert , oriented and patient cooperative  Airway & Oxygen Therapy: Patient Spontanous Breathing and Patient connected to face mask oxygen  Post-op Assessment: Report given to PACU RN, Post -op Vital signs reviewed and stable and Patient moving all extremities  Post vital signs: Reviewed and stable  Complications: No apparent anesthesia complications

## 2014-04-29 NOTE — Anesthesia Postprocedure Evaluation (Signed)
Anesthesia Post Note  Patient: [name]  Procedure(s) Performed: Procedure(s) (LRB): INSERTION OF SUPRAPUBIC CATHETER (N/A)  Anesthesia type: MAC  Patient location: PACU  Post pain: Pain level controlled  Post assessment: Post-op Vital signs reviewed  Last Vitals: BP 159/83  Pulse 58  Temp(Src) 36.4 C (Oral)  Resp 12  Ht 5\' 9"  (1.753 m)  Wt 204 lb (92.534 kg)  BMI 30.11 kg/m2  SpO2 99%  Post vital signs: Reviewed  Level of consciousness: awake  Complications: No apparent anesthesia complications

## 2014-04-29 NOTE — Discharge Instructions (Signed)
Suprapubic Catheter °Home Guide °A suprapubic catheter is a rubber tube with a tiny balloon on the end. It is used to drain urine from the bladder. This catheter is put in your bladder through a small opening in the lower center part of your abdomen. Suprapubic refers to the area right above your pubic bone. °The balloon on the end of the catheter is filled with germ-free (sterile) water. This keeps the catheter from slipping out. When the catheter is in place, your urine will drain into a collection bag. The bag can be put beside your bed at night or attached to your leg during the day. °HOW TO CARE FOR YOUR CATHETER  °Cleaning your skin °· Clean the skin around the catheter opening every day. °¨ Wash your hands with soap and water. °¨ Clean the skin around the opening with a clean washcloth and soapy water. Do not pull on the tube. °¨ Pat the area dry with a clean towel. °· Your caregiver may want you to put a bandage (dressing) over the site. Do not use ointment on this area unless your caregiver tells you to. °Cleaning the catheter °· Ask your caregiver if you need to clean the catheter and how often. °· Use only soap and water. °· There may be crusts on the catheter. Put hydrogen peroxide on a cotton ball or gauze pad to remove any crust.  °Emptying the collection bag °· You may have a large drainage bag to use at night and a smaller one for daytime. Empty the large bag every 8 hours. Empty the small bag when it is about  full. °· Keep the drainage bag below the level of the catheter. This keeps urine from flowing backwards. °· Hold the bag over the toilet or another container. Release the valve (spigot) at the bottom of the bag. Do not touch the opening of the spigot. Do not let the opening touch the toilet or container. °· Close the spigot tightly when the bag is empty. °Cleaning the collection bag °· Clean the bag every few days. °¨ First, wash your hands. °¨ Disconnect the tubing from the catheter. Replace  the used bag with a new bag. Then you can clean the used one. °¨ Empty the used bag completely. Rinse it out with warm water and soap or fill the bag with water and add 1 teaspoon of vinegar. Let it sit for about 30 minutes. Then drain. °· The bag should be completely dry before storing it. Put it inside a plastic bag to keep it clean. °Checking everything °· Always make sure there are no kinks in the catheter or tubing. °· Always make sure there are no leaks in the catheter, tubing, or collection bag. °HOW TO CHANGE YOUR CATHETER °Sometimes, a caregiver will change your suprapubic catheter. Other times, you may need to change it yourself. This may be the case if you need to wear a catheter for a long time. Usually, they need to be changed every 4 to 6 weeks. Ask your caregiver how often yours should be changed. °Your caregiver will help you order the following supplies for home delivery: °· Sterile gloves. °· Catheters. °· Syringes. °· Sterile water. °· Sterile cleaning solution. °· Lubricant. °· Drainage bags. °Changing your catheter °· Drink plenty of fluids before changing the catheter. °· Wash your hands with soap and water. °· Lie on your back and put on sterile gloves. °· Clean the skin around the catheter opening. Use the sterile cleaning solution. °· Use   a syringe to get the water out of the balloon from the old catheter. °· Slowly remove the catheter. °· Take off the first pair of gloves, and put on a new pair. Then put lubricant on the tip of the new catheter. Put the new catheter through the opening. °· Wait for some urine to start flowing. Then, use the other syringe to fill the balloon with sterile water. °· Attach the catheter to your drainage bag. Make sure the connection is tight. °Important warnings °· The catheter should come out easily. If it seems stuck, do not pull it. °· Call your caregiver right away if you have any trouble while changing the catheter. °· When the old catheter is removed, the  new one should be put in right away. This is because the opening will close quickly. If you have a problem, go to an emergency clinic right away. °RISKS AND COMPLICATIONS °· Urine flow can become blocked. This can happen if the catheter or tubes are not working right. A blood clot can also block urine flow. °· The catheter might irritate tissue in your body. This can cause bleeding. °· The skin near the opening for the catheter may become irritated or infected. °· Bacteria may get into your bladder. This can cause a urinary tract infection. °HOME CARE INSTRUCTIONS °· Take all medicines prescribed by your caregiver. Follow the directions carefully. °· Drink 8 glasses of water every day. This produces good urine flow. °· Check the skin around your catheter a few times every day. Watch for redness and swelling. Look for any fluids coming out of the opening. °· Do not use powder or cream around the catheter opening. °· Do not take tub baths or use pools or hot tubs. °· Keep all follow-up appointments. °SEEK MEDICAL CARE IF: °· You leak urine. °· Your skin around the catheter becomes red or sore. °· Your urine flow slows down. °· Your urine gets cloudy or smelly. °SEEK IMMEDIATE MEDICAL CARE IF:  °· You have chills, nausea, or back pain. °· You have trouble changing your catheter. °· Your catheter comes out. °· You have blood in your urine. °· You have no urine flow for 1 hour. °· You have a fever. °Document Released: 05/21/2011 Document Revised: 11/25/2011 Document Reviewed: 05/21/2011 °[employer]® Patient Information ©2015 [employer]. This information is not intended to replace advice given to you by your health care provider. Make sure you discuss any questions you have with your health care provider. ° °

## 2014-05-02 ENCOUNTER — Encounter (HOSPITAL_COMMUNITY): Payer: Self-pay | Admitting: Urology

## 2014-05-05 ENCOUNTER — Ambulatory Visit: Payer: Medicare Other | Admitting: Pulmonary Disease

## 2014-05-09 ENCOUNTER — Non-Acute Institutional Stay (SKILLED_NURSING_FACILITY): Payer: PRIVATE HEALTH INSURANCE | Admitting: Internal Medicine

## 2014-05-09 ENCOUNTER — Encounter: Payer: Self-pay | Admitting: Internal Medicine

## 2014-05-09 DIAGNOSIS — M810 Age-related osteoporosis without current pathological fracture: Secondary | ICD-10-CM

## 2014-05-09 DIAGNOSIS — D638 Anemia in other chronic diseases classified elsewhere: Secondary | ICD-10-CM

## 2014-05-09 DIAGNOSIS — E1149 Type 2 diabetes mellitus with other diabetic neurological complication: Secondary | ICD-10-CM

## 2014-05-09 NOTE — Progress Notes (Deleted)
Patient ID: [name], female   DOB: September 04, 1926, 80 y.o.   MRN: [id number]  Location:  [facility] Provider:  Blanchie Serve, MD.  Code Status:  Full  Chief Complaint  Patient presents with  . Medical Management of Chronic Issues    HPI:  *** Review of Systems:  *** Medications: Patient's Medications  New Prescriptions   No medications on file  Previous Medications   ACETAMINOPHEN (TYLENOL) 500 MG TABLET    Take 500 mg by mouth 3 (three) times daily.    ALBUTEROL (PROVENTIL) (2.5 MG/3ML) 0.083% NEBULIZER SOLUTION    Take 2.5 mg by nebulization 3 (three) times daily.   ASPIRIN 81 MG CHEWABLE TABLET    Chew 81 mg by mouth daily.   BISACODYL (DULCOLAX) 10 MG SUPPOSITORY    Place 1 suppository (10 mg total) rectally daily as needed for moderate constipation.   BUDESONIDE (PULMICORT) 0.5 MG/2ML NEBULIZER SOLUTION    Take 0.5 mg by nebulization 2 (two) times daily.   CHOLECALCIFEROL (VITAMIN D) 1000 UNITS TABLET    Take 2,000 Units by mouth daily.   CYANOCOBALAMIN (VITAMIN B-12) 1000 MCG SUBL    Place 1,000 mcg under the tongue daily.   CYCLOSPORINE (RESTASIS) 0.05 % OPHTHALMIC EMULSION    Place 1 drop into both eyes 2 (two) times daily.    DONEPEZIL (ARICEPT) 10 MG TABLET    Take 10 mg by mouth at bedtime.    FLUPHENAZINE DECANOATE IJ    Inject 25 mg/mL as directed every 30 (thirty) days. Once a month on the 19th.   FLUTICASONE (FLONASE) 50 MCG/ACT NASAL SPRAY    Place 2 sprays into the nose daily.   GABAPENTIN (NEURONTIN) 300 MG CAPSULE    Take 300 mg by mouth at bedtime.   HYDROCODONE-ACETAMINOPHEN (NORCO/VICODIN) 5-325 MG PER TABLET    Take 0.5 tablets by mouth every 6 (six) hours as needed for moderate pain.   IPRATROPIUM-ALBUTEROL (DUONEB) 0.5-2.5 (3) MG/3ML SOLN    Take 3 mLs by nebulization every 6 (six) hours as needed (shortness of breath).    OLOPATADINE (PATANOL) 0.1 % OPHTHALMIC SOLUTION    Place 1 drop into both eyes 2 (two) times daily.    OMEPRAZOLE (PRILOSEC) 40 MG  CAPSULE    Take 40 mg by mouth 2 (two) times daily.   POLYETHYLENE GLYCOL (MIRALAX / GLYCOLAX) PACKET    Take 17 g by mouth daily.   PRAMOXINE HCL (VAGISIL MAXIMUM STRENGTH) 1 % MISC    Apply 1 application topically daily.   SENNOSIDES-DOCUSATE SODIUM (SENOKOT-S) 8.6-50 MG TABLET    Take 2 tablets by mouth at bedtime.   SERTRALINE (ZOLOFT) 50 MG TABLET    Take 50 mg by mouth every morning.    SODIUM PHOSPHATE (FLEET) 7-19 GM/118ML ENEM    Place 1 enema rectally daily as needed for severe constipation.  Modified Medications   No medications on file  Discontinued Medications   ZINC OXIDE (BALMEX) 11.3 % CREA CREAM    Apply 1 application topically 2 (two) times daily. Applied to buttocks every shift for redness    Physical Exam: Filed Vitals:   05/09/14 1609  BP: 133/70  Pulse: 60  Temp: 98.4 F (36.9 C)  Resp: 18   *** (delete stars and type .physexam)  Labs reviewed: Basic Metabolic Panel:  Recent Labs  12/22/13 0701 12/23/13 0441 04/29/14 0945  NA 139 139 142  K 3.9 4.0 4.1  CL 103 101 104  CO2 23 24 29   GLUCOSE  108* 110* 92  BUN 10 11 11   CREATININE 0.66 0.81 0.76  CALCIUM 9.7 9.9 10.6*    Liver Function Tests:  Recent Labs  12/21/13 1032  AST 12  ALT 11  ALKPHOS 71  BILITOT 0.3  PROT 6.3  ALBUMIN 2.6*    CBC:  Recent Labs  12/21/13 1032 12/22/13 0701 12/23/13 0441 04/29/14 0945  WBC 7.4 5.9 7.0 4.9  NEUTROABS 5.0  --   --   --   HGB 11.0* 10.8* 10.5* 10.6*  HCT 34.7* 34.7* 33.2* 34.6*  MCV 86.1 86.5 85.6 86.1  PLT 183 170 174 179    Significant Diagnostic Results: ***  Assessment/Plan No problem-specific assessment & plan notes found for this encounter.   Family/ staff Communication: ***  Goals of care: ***  Labs/tests ordered:  ***

## 2014-05-09 NOTE — Progress Notes (Signed)
Patient ID: [name], female   DOB: 10-Jan-1926, 80 y.o.   MRN: [id number]  Location:  [name] Provider:  Blanchie Serve, MD.  Code Status:  Full  Chief Complaint  Patient presents with  . Medical Management of Chronic Issues    HPI:  80 y/o seen for RV. Her pain is under control. New catheter placed on 04/29/14. Anemia stable,has diet controlled dm. Alert and makes her need known. No concerns reported  Review of Systems:  Negative for fever and chills Negative for nausea and vomiting No falls Working wit restorative OOB  Feeds herself Pain controlled  Medications: Patient's Medications  New Prescriptions   No medications on file  Previous Medications   ACETAMINOPHEN (TYLENOL) 500 MG TABLET    Take 500 mg by mouth 3 (three) times daily.    ALBUTEROL (PROVENTIL) (2.5 MG/3ML) 0.083% NEBULIZER SOLUTION    Take 2.5 mg by nebulization 3 (three) times daily.   ASPIRIN 81 MG CHEWABLE TABLET    Chew 81 mg by mouth daily.   BISACODYL (DULCOLAX) 10 MG SUPPOSITORY    Place 1 suppository (10 mg total) rectally daily as needed for moderate constipation.   BUDESONIDE (PULMICORT) 0.5 MG/2ML NEBULIZER SOLUTION    Take 0.5 mg by nebulization 2 (two) times daily.   CHOLECALCIFEROL (VITAMIN D) 1000 UNITS TABLET    Take 2,000 Units by mouth daily.   CYANOCOBALAMIN (VITAMIN B-12) 1000 MCG SUBL    Place 1,000 mcg under the tongue daily.   CYCLOSPORINE (RESTASIS) 0.05 % OPHTHALMIC EMULSION    Place 1 drop into both eyes 2 (two) times daily.    DONEPEZIL (ARICEPT) 10 MG TABLET    Take 10 mg by mouth at bedtime.    FLUPHENAZINE DECANOATE IJ    Inject 25 mg/mL as directed every 30 (thirty) days. Once a month on the 19th.   FLUTICASONE (FLONASE) 50 MCG/ACT NASAL SPRAY    Place 2 sprays into the nose daily.   GABAPENTIN (NEURONTIN) 300 MG CAPSULE    Take 300 mg by mouth at bedtime.   HYDROCODONE-ACETAMINOPHEN (NORCO/VICODIN) 5-325 MG PER TABLET    Take 0.5 tablets by mouth every 6 (six) hours  as needed for moderate pain.   IPRATROPIUM-ALBUTEROL (DUONEB) 0.5-2.5 (3) MG/3ML SOLN    Take 3 mLs by nebulization every 6 (six) hours as needed (shortness of breath).    OLOPATADINE (PATANOL) 0.1 % OPHTHALMIC SOLUTION    Place 1 drop into both eyes 2 (two) times daily.    OMEPRAZOLE (PRILOSEC) 40 MG CAPSULE    Take 40 mg by mouth 2 (two) times daily.   POLYETHYLENE GLYCOL (MIRALAX / GLYCOLAX) PACKET    Take 17 g by mouth daily.   PRAMOXINE HCL (VAGISIL MAXIMUM STRENGTH) 1 % MISC    Apply 1 application topically daily.   SENNOSIDES-DOCUSATE SODIUM (SENOKOT-S) 8.6-50 MG TABLET    Take 2 tablets by mouth at bedtime.   SERTRALINE (ZOLOFT) 50 MG TABLET    Take 50 mg by mouth every morning.    SODIUM PHOSPHATE (FLEET) 7-19 GM/118ML ENEM    Place 1 enema rectally daily as needed for severe constipation.  Modified Medications   No medications on file  Discontinued Medications   ZINC OXIDE (BALMEX) 11.3 % CREA CREAM    Apply 1 application topically 2 (two) times daily. Applied to buttocks every shift for redness    Physical Exam: Filed Vitals:   05/09/14 1609  BP: 133/70  Pulse: 60  Temp: 98.4 F (  36.9 C)  Resp: 18  Height: 5\' 9"  (1.753 m)  Weight: 204 lb (92.534 kg)   General- elderly female in no acute distress Head- atraumatic, normocephalic Neck- no lymphadenopathy Cardiovascular- normal s1,s2, no murmurs/ rubs/ gallops Respiratory- bilateral clear to auscultation, no wheeze, no rhonchi, no crackles Abdomen- bowel sounds present, soft, non tender Musculoskeletal- left sided hemiplegia, no leg edema Neurological- alert Psychiatry- normal mood and affect   Labs reviewed: Basic Metabolic Panel:  Recent Labs  12/22/13 0701 12/23/13 0441 04/29/14 0945  NA 139 139 142  K 3.9 4.0 4.1  CL 103 101 104  CO2 23 24 29   GLUCOSE 108* 110* 92  BUN 10 11 11   CREATININE 0.66 0.81 0.76  CALCIUM 9.7 9.9 10.6*    Liver Function Tests:  Recent Labs  12/21/13 1032  AST 12  ALT 11    ALKPHOS 71  BILITOT 0.3  PROT 6.3  ALBUMIN 2.6*    CBC:  Recent Labs  12/21/13 1032 12/22/13 0701 12/23/13 0441 04/29/14 0945  WBC 7.4 5.9 7.0 4.9  NEUTROABS 5.0  --   --   --   HGB 11.0* 10.8* 10.5* 10.6*  HCT 34.7* 34.7* 33.2* 34.6*  MCV 86.1 86.5 85.6 86.1  PLT 183 170 174 179    Assessment/plan  Dm with neuropathy Diet controlled. Continue gabapentin for neuropathic pain  Osteoporosis Stable, continue vit d , chronic and stable, fall precautions  Anemia of chronic disease Stable, monitor clinically

## 2014-05-09 NOTE — Progress Notes (Signed)
This encounter was created in error - please disregard.

## 2014-06-13 ENCOUNTER — Non-Acute Institutional Stay (SKILLED_NURSING_FACILITY): Payer: PRIVATE HEALTH INSURANCE | Admitting: Internal Medicine

## 2014-06-13 ENCOUNTER — Encounter: Payer: Self-pay | Admitting: Internal Medicine

## 2014-06-13 DIAGNOSIS — N760 Acute vaginitis: Secondary | ICD-10-CM

## 2014-06-13 DIAGNOSIS — M810 Age-related osteoporosis without current pathological fracture: Secondary | ICD-10-CM | POA: Insufficient documentation

## 2014-06-13 DIAGNOSIS — D638 Anemia in other chronic diseases classified elsewhere: Secondary | ICD-10-CM | POA: Insufficient documentation

## 2014-06-13 DIAGNOSIS — I69959 Hemiplegia and hemiparesis following unspecified cerebrovascular disease affecting unspecified side: Secondary | ICD-10-CM

## 2014-06-13 DIAGNOSIS — J9610 Chronic respiratory failure, unspecified whether with hypoxia or hypercapnia: Secondary | ICD-10-CM

## 2014-06-13 DIAGNOSIS — I69998 Other sequelae following unspecified cerebrovascular disease: Secondary | ICD-10-CM | POA: Insufficient documentation

## 2014-06-13 DIAGNOSIS — IMO0002 Reserved for concepts with insufficient information to code with codable children: Secondary | ICD-10-CM

## 2014-06-13 DIAGNOSIS — N319 Neuromuscular dysfunction of bladder, unspecified: Secondary | ICD-10-CM

## 2014-06-13 NOTE — Progress Notes (Signed)
Patient ID: [name], female   DOB: November 05, 1925, 80 y.o.   MRN: [id number]    Facility: [facility]  Chief Complaint  Patient presents with  . Medical Management of Chronic Issues   Allergies  Allergen Reactions  . Lisinopril     Per MAR leg weakness  . Lisinopril    HPI 80 y/o female patient is seen today for routine visit. She is on her bed, has o2 by nasal canula, appears comfortable and is feeding herself. She now has a suprapubic catheter for her neurogenic bladder. She has dementia, cva with hemiplegia, copd on o2, ckd. She is alert and can make her needs known. Completed course of diflucan for vaginitis. Currently no symptoms.  Review of Systems  Constitutional: Negative for fever, chills Eyes: Negative for blurred vision  Respiratory: Negative for cough, sputum production, shortness of breath and wheezing.  on oxygen by [place] Cardiovascular: Negative for chest pain, palpitations.  Gastrointestinal: Negative for heartburn, nausea, vomiting, abdominal pain  Genitourinary: has suprapubic catheter Musculoskeletal: Negative for back pain, falls.  Skin: Negative for itching and rash.  Neurological: Negative for dizziness, headaches. has left sided weakness post cva Psychiatric/Behavioral: Negative for depression. Has dementia  Past Medical History  Diagnosis Date  . Anemia   . Arthritis   . COPD (chronic obstructive pulmonary disease)   . Chronic schizophrenia   . Dementia   . Depressive disorder   . Acute sinusitis   . Schizophrenia   . Osteoporosis   . Stenosis of lumbosacral spine   . Glaucoma   . On home oxygen therapy     2 L PER NASAL CANNULA  . GERD (gastroesophageal reflux disease)   . Stroke     L hemiparesis  . Cervical spondylosis   . DM (diabetes mellitus)     DIET CONTROLLED  . Chronic kidney disease     chronic kidney disease per MD notes  . CHF (congestive heart failure)     chronic and stable per MD note  .  Hyperlipidemia   . Hypertension   . Foley catheter in place   . Wheelchair dependent   . Breast cancer    Current Outpatient Prescriptions on File Prior to Visit  Medication Sig Dispense Refill  . acetaminophen (TYLENOL) 500 MG tablet Take 500 mg by mouth 3 (three) times daily.       Marland Kitchen albuterol (PROVENTIL) (2.5 MG/3ML) 0.083% nebulizer solution Take 2.5 mg by nebulization 3 (three) times daily.      Marland Kitchen aspirin 81 MG chewable tablet Chew 81 mg by mouth daily.      Marland Kitchen aspirin 81 MG tablet Take 81 mg by mouth daily.      . bisacodyl (DULCOLAX) 10 MG suppository Place 1 suppository (10 mg total) rectally daily as needed for moderate constipation.  12 suppository  0  . budesonide (PULMICORT) 0.5 MG/2ML nebulizer solution Take 0.5 mg by nebulization 2 (two) times daily.      . cholecalciferol (VITAMIN D) 1000 UNITS tablet Take 2,000 Units by mouth daily.      . Cyanocobalamin (VITAMIN B-12) 1000 MCG SUBL Place 1,000 mcg under the tongue daily.      . cycloSPORINE (RESTASIS) 0.05 % ophthalmic emulsion Place 1 drop into both eyes 2 (two) times daily.       Marland Kitchen donepezil (ARICEPT) 10 MG tablet Take 10 mg by mouth at bedtime.       Marland Kitchen FLUPHENAZINE DECANOATE IJ Inject  25 mg/mL as directed every 30 (thirty) days. Once a month on the 19th.      . fluticasone (FLONASE) 50 MCG/ACT nasal spray Place 2 sprays into the nose daily.      Marland Kitchen gabapentin (NEURONTIN) 300 MG capsule Take 300 mg by mouth at bedtime.      Marland Kitchen HYDROcodone-acetaminophen (NORCO/VICODIN) 5-325 MG per tablet Take 0.5 tablets by mouth every 6 (six) hours as needed for moderate pain.  15 tablet  0  . ipratropium-albuterol (DUONEB) 0.5-2.5 (3) MG/3ML SOLN Take 3 mLs by nebulization every 6 (six) hours as needed (shortness of breath).       Marland Kitchen olopatadine (PATANOL) 0.1 % ophthalmic solution Place 1 drop into both eyes 2 (two) times daily.       Marland Kitchen omeprazole (PRILOSEC) 40 MG capsule Take 40 mg by mouth 2 (two) times daily.      . polyethylene glycol  (MIRALAX / GLYCOLAX) packet Take 17 g by mouth daily.      . Pramoxine HCl (VAGISIL MAXIMUM STRENGTH) 1 % MISC Apply 1 application topically daily.      . sennosides-docusate sodium (SENOKOT-S) 8.6-50 MG tablet Take 2 tablets by mouth at bedtime.      . sertraline (ZOLOFT) 50 MG tablet Take 50 mg by mouth every morning.       . sodium phosphate (FLEET) 7-19 GM/118ML ENEM Place 1 enema rectally daily as needed for severe constipation.    0   No current facility-administered medications on file prior to visit.   Physical exam BP 121/69  Pulse 85  Temp(Src) 97.8 F (36.6 C)  Resp 16  General- elderly female in no acute distress Head- atraumatic, normocephalic Eyes- PERRLA, EOMI, no pallor, no icterus, no discharge Neck- no lymphadenopathy Mouth- normal mucus membrane Cardiovascular- normal s1,s2, no murmurs, no leg edema Respiratory- bilateral clear to auscultation, no wheeze, no rhonchi, no crackles Abdomen- bowel sounds present, soft, non tender, suprapubic catheter in place and site clean Musculoskeletal- left hemiplegia with left hand contracted, has a splint in place, works with restorative, OOB, hoyer transfer Neurological- alert and oriented, no new focal deficit Skin- warm and dry Psychiatry- normal mood and affect  Labs 03/02/14 wbc 6.4, hb 10.6, hct 35.9, plt 171, ca 9.9, na 141, k 3.8, bun 12, cr 0.8, glu 82 03/31/14 t.chol 130, tg 120, ldl 69, hdl 37, a1c 5.8  Assessment/plan  Chronic respiratory failure In setting of her copd, on o2, continue bronchodilator regimen  cva with hemiplegia Working with restorative, continue skin care, assistance with ADLs. Monitor bowel movement. Continue aspirin and gabapentin with norco  Neurogenic bladder Has suprapubic catheter in place, skin care  Vaginitis Symptom resolved. Completed diflucan course

## 2014-06-13 NOTE — Progress Notes (Signed)
This encounter was created in error - please disregard.

## 2014-06-29 LAB — BASIC METABOLIC PANEL
BUN: 13 mg/dL (ref 4–21)
Creatinine: 0.3 mg/dL — AB (ref 0.5–1.1)
Glucose: 84 mg/dL
POTASSIUM: 3.9 mmol/L (ref 3.4–5.3)
Sodium: 139 mmol/L (ref 137–147)

## 2014-06-29 LAB — HEPATIC FUNCTION PANEL
ALT: 7 U/L (ref 7–35)
AST: 9 U/L — AB (ref 13–35)
Alkaline Phosphatase: 83 U/L (ref 25–125)

## 2014-06-29 LAB — CBC AND DIFFERENTIAL
HEMATOCRIT: 33 % — AB (ref 36–46)
HEMOGLOBIN: 10 g/dL — AB (ref 12.0–16.0)
Platelets: 162 10*3/uL (ref 150–399)
WBC: 6.5 10*3/mL

## 2014-06-29 LAB — HEMOGLOBIN A1C: HEMOGLOBIN A1C: 5.6 % (ref 4.0–6.0)

## 2014-07-11 ENCOUNTER — Non-Acute Institutional Stay (SKILLED_NURSING_FACILITY): Payer: PRIVATE HEALTH INSURANCE | Admitting: Internal Medicine

## 2014-07-11 DIAGNOSIS — E1140 Type 2 diabetes mellitus with diabetic neuropathy, unspecified: Secondary | ICD-10-CM

## 2014-07-11 DIAGNOSIS — F0151 Vascular dementia with behavioral disturbance: Secondary | ICD-10-CM

## 2014-07-11 DIAGNOSIS — J449 Chronic obstructive pulmonary disease, unspecified: Secondary | ICD-10-CM

## 2014-07-11 DIAGNOSIS — F0393 Unspecified dementia, unspecified severity, with mood disturbance: Secondary | ICD-10-CM

## 2014-07-11 DIAGNOSIS — F329 Major depressive disorder, single episode, unspecified: Secondary | ICD-10-CM

## 2014-07-11 DIAGNOSIS — F0280 Dementia in other diseases classified elsewhere without behavioral disturbance: Secondary | ICD-10-CM

## 2014-07-11 NOTE — Progress Notes (Signed)
Patient ID: [name], female   DOB: October 14, 1925, 80 y.o.   MRN: [id number]   Place of Service: [name]  Allergies  Allergen Reactions  . Lisinopril     Per MAR leg weakness  . Lisinopril     Code Status: Full Code Goals of Care: Longevity/Long term care  Chief Complaint  Patient presents with  . Medical Management of Chronic Issues    vasuclar dementia with depression, copd, depression, neuropathy    HPI 80 y.o. female with PMH of neurogenic bladder, vascular dementia, depression, and copd among others is being seen for a routine visit. Weight stable. No falls, skin issues, change in bowel habit, change in appetite, change in behaviors, or change in functional status reported. No concerns from nursing staff.   Review of Systems Constitutional: Negative for fever, chills, and fatigue. HENT: Negative for facial swelling, ear pain, congestion, and sore throat Eyes: Negative for eye pain, eye discharge, and visual disturbance  Cardiovascular: Negative for chest pain, palpitations, Positive for leg swelling Respiratory: Negative cough, shortness of breath, and wheezing.Postive for Oxygen dependent  Gastrointestinal: Negative for nausea and vomiting. Negative for abdominal pain, diarrhea and constipation.  Musculoskeletal: Negative for back pain, joint pain, and joint swelling  Neurological: Negative for dizziness, headache. Positive for numbness and tingling  Skin: Negative for rash and wound.   Psychiatric: Negative for nervous/anxious, agitation, depression. Has vascular dementia and schizophrenia at baseline.  Past Medical History  Diagnosis Date  . Anemia   . Arthritis   . COPD (chronic obstructive pulmonary disease)   . Chronic schizophrenia   . Dementia   . Depressive disorder   . Acute sinusitis   . Schizophrenia   . Osteoporosis   . Stenosis of lumbosacral spine   . Glaucoma   . On home oxygen therapy     2 L PER NASAL CANNULA  . GERD  (gastroesophageal reflux disease)   . Stroke     L hemiparesis  . Cervical spondylosis   . DM (diabetes mellitus)     DIET CONTROLLED  . Chronic kidney disease     chronic kidney disease per MD notes  . CHF (congestive heart failure)     chronic and stable per MD note  . Hyperlipidemia   . Hypertension   . Foley catheter in place   . Wheelchair dependent   . Breast cancer     Past Surgical History  Procedure Laterality Date  . Appendectomy    . Cataract extraction, bilateral    . Cholecystectomy    . Uterine suspension    . Joint replacement      L TOTAL KNEE  . Breast surgery      breast cancer  . Insertion of suprapubic catheter N/A 04/29/2014    Procedure: INSERTION OF SUPRAPUBIC CATHETER;  Surgeon: Arvil Persons, MD;  Location: [facility];  Service: Urology;  Laterality: N/A;    History   Social History  . Marital Status: Single    Spouse Name: N/A    Number of Children: N/A  . Years of Education: N/A   Occupational History  . Not on file.   Social History Main Topics  . Smoking status: Former Smoker -- 1.00 packs/day for 15 years    Types: Cigarettes    Quit date: 09/16/1997  . Smokeless tobacco: Not on file  . Alcohol Use: No  . Drug Use: No  . Sexual Activity: No   Other Topics Concern  .  Not on file   Social History Narrative  . No narrative on file      Medication List       This list is accurate as of: 07/11/14  4:01 PM.  Always use your most recent med list.               acetaminophen 500 MG tablet  Commonly known as:  TYLENOL  Take 500 mg by mouth 3 (three) times daily.     albuterol (2.5 MG/3ML) 0.083% nebulizer solution  Commonly known as:  PROVENTIL  Take 2.5 mg by nebulization 3 (three) times daily.     aspirin 81 MG chewable tablet  Chew 81 mg by mouth daily.     aspirin 81 MG tablet  Take 81 mg by mouth daily.     bisacodyl 10 MG suppository  Commonly known as:  DULCOLAX  Place 1 suppository (10 mg total) rectally daily  as needed for moderate constipation.     budesonide 0.5 MG/2ML nebulizer solution  Commonly known as:  PULMICORT  Take 0.5 mg by nebulization 2 (two) times daily.     cholecalciferol 1000 UNITS tablet  Commonly known as:  VITAMIN D  Take 2,000 Units by mouth daily.     cycloSPORINE 0.05 % ophthalmic emulsion  Commonly known as:  RESTASIS  Place 1 drop into both eyes 2 (two) times daily.     donepezil 10 MG tablet  Commonly known as:  ARICEPT  Take 10 mg by mouth at bedtime.     FLUPHENAZINE DECANOATE IJ  Inject 25 mg/mL as directed every 30 (thirty) days. Once a month on the 19th.     fluticasone 50 MCG/ACT nasal spray  Commonly known as:  FLONASE  Place 2 sprays into the nose daily.     gabapentin 300 MG capsule  Commonly known as:  NEURONTIN  Take 300 mg by mouth at bedtime.     HYDROcodone-acetaminophen 5-325 MG per tablet  Commonly known as:  NORCO/VICODIN  Take 0.5 tablets by mouth every 6 (six) hours as needed for moderate pain.     ipratropium-albuterol 0.5-2.5 (3) MG/3ML Soln  Commonly known as:  DUONEB  Take 3 mLs by nebulization every 6 (six) hours as needed (shortness of breath).     olopatadine 0.1 % ophthalmic solution  Commonly known as:  PATANOL  Place 1 drop into both eyes 2 (two) times daily.     omeprazole 40 MG capsule  Commonly known as:  PRILOSEC  Take 40 mg by mouth 2 (two) times daily.     polyethylene glycol packet  Commonly known as:  MIRALAX / GLYCOLAX  Take 17 g by mouth daily.     sennosides-docusate sodium 8.6-50 MG tablet  Commonly known as:  SENOKOT-S  Take 2 tablets by mouth at bedtime.     sertraline 50 MG tablet  Commonly known as:  ZOLOFT  Take 50 mg by mouth every morning.     sodium phosphate 7-19 GM/118ML Enem  Place 1 enema rectally daily as needed for severe constipation.     VAGISIL MAXIMUM STRENGTH 1 % Misc  Generic drug:  Pramoxine HCl  Apply 1 application topically daily.     Vitamin B-12 1000 MCG Subl  Place  1,000 mcg under the tongue daily.        Physical Exam Filed Vitals:   07/11/14 1206  BP: 127/72  Pulse: 69  Temp: 97.6 F (36.4 C)  Resp: 19   Constitutional: WDWN AA  elderly female in no acute distress.  HEENT: Normocephalic and atraumatic. PERRL. EOM intact. No sinus tenderness.  Neck: Supple and nontender. No lymphadenopathy, masses, or thyromegaly. No JVD or carotid bruits. Cardiac: Normal S1, S2. RRR without appreciable murmurs, rubs, or gallops. Distal pulses intact. 1+ pitting dependent edema bilaterally Lungs: No respiratory distress. Breath sounds clear bilaterally without rales, rhonchi, or wheezes. On 2L Oxygen via nasal cannula.  Abdomen: Audible bowel sounds in all quadrants. Soft, nontender, nondistended. No palpable mass.  Musculoskeletal:  No joint erythema or tenderness. Has Lsided hemiplegia.  Skin: Warm and dry. No rash noted. No erythema.  Neurological: Alert and oriented to person Psychiatric: Appropriate mood and affect. Alert and conversational  Labs Reviewed CBC Latest Ref Rng 06/29/2014 04/29/2014 12/23/2013  WBC - 6.5 4.9 7.0  Hemoglobin 12.0 - 16.0 g/dL 10.0(A) 10.6(L) 10.5(L)  Hematocrit 36 - 46 % 33(A) 34.6(L) 33.2(L)  Platelets 150 - 399 K/L 162 179 174      Chemistry      Component Value Date/Time   NA 139 06/29/2014   NA 142 04/29/2014 0945   K 3.9 06/29/2014   CL 104 04/29/2014 0945   CO2 29 04/29/2014 0945   BUN 13 06/29/2014   BUN 11 04/29/2014 0945   CREATININE 0.3* 06/29/2014   CREATININE 0.76 04/29/2014 0945   GLU 84 06/29/2014      Component Value Date/Time   CALCIUM 10.6* 04/29/2014 0945   ALKPHOS 83 06/29/2014   AST 9* 06/29/2014   ALT 7 06/29/2014   BILITOT 0.3 12/21/2013 1032     Lab Results  Component Value Date   HGBA1C 5.6 06/29/2014    Assessment & Plan 1. Vascular dementia, with behavioral disturbance Stable. Continue aricept 10mg  daily and asa 81mg  daily. Continue to monitor for change in behaviors. Continue fall  and pressure ulcer precautions  2. Chronic obstructive pulmonary disease, unspecified COPD, unspecified chronic bronchitis type Stable. On continuous oxygen at 2L/min via nasal cannula. Continue pulmicort via neb twice daily , ventolin/prventil via neb three times daily, and duoneb via neb Q6H as needed. Continue to monitor.   3. Depression due to dementia Stable. Continue zoloft 50mg  daily and montior  4. Neuropathy due to type 2 diabetes mellitus Stable. Continue gabapentin 300mg  daily and monitor   Family/Staff Communication Plan of care discuss with resident and professional staff members. Resident and professional staff members verbalize understanding and agree with plan of care. No additional questions or concerns reported.    Arthur Holms, MSN, AGNP-C [address], [place] [id number] [phone] [8am-5pm] After hours: [phone]  I have personally reviewed this note and agree with the care plan  [facility], MD  [facility] [phone] (Monday-Friday 8 am - 5 pm) [phone] (afterhours)

## 2014-08-11 ENCOUNTER — Non-Acute Institutional Stay (SKILLED_NURSING_FACILITY): Payer: PRIVATE HEALTH INSURANCE | Admitting: Internal Medicine

## 2014-08-11 DIAGNOSIS — M792 Neuralgia and neuritis, unspecified: Secondary | ICD-10-CM

## 2014-08-11 DIAGNOSIS — F330 Major depressive disorder, recurrent, mild: Secondary | ICD-10-CM

## 2014-08-11 DIAGNOSIS — N319 Neuromuscular dysfunction of bladder, unspecified: Secondary | ICD-10-CM

## 2014-08-11 DIAGNOSIS — G8104 Flaccid hemiplegia affecting left nondominant side: Secondary | ICD-10-CM

## 2014-08-11 DIAGNOSIS — F209 Schizophrenia, unspecified: Secondary | ICD-10-CM

## 2014-08-11 DIAGNOSIS — F0150 Vascular dementia without behavioral disturbance: Secondary | ICD-10-CM

## 2014-08-11 NOTE — Progress Notes (Signed)
Patient ID: [name], female   DOB: [birth year], 80 y.o.   MRN: [id number]    Facility: [facility]  Chief complaint- medical management of chronic issues  Allergies- reviewed- lisinopril, ultram  HPI 80 y/o female patient is seen today for routine visit. She is on her bed, has o2 by nasal canula, appears comfortable. She has suprapubic catheter for neurogenic bladder, dementia, cva with hemiplegia, copd on o2, ckd. She is working with restorative team. Weight is stable. She co,plaints of pain in both her feet bothering her. She is alert and can make her needs known.   Review of Systems  Constitutional: Negative for fever, chills Eyes: Negative for blurred vision  Respiratory: Negative for cough, shortness of breath at rest and wheezing.  on oxygen by [place] Cardiovascular: Negative for chest pain, palpitations.  Gastrointestinal: Negative for heartburn, nausea, vomiting, abdominal pain  Genitourinary: has suprapubic catheter Musculoskeletal: Negative for back pain, falls.  Skin: Negative for itching and rash.  Neurological: Negative for dizziness, headaches. has left sided weakness post cva Psychiatric/Behavioral: Negative for depression. Has dementia  Past Medical History  Diagnosis Date  . Anemia   . Arthritis   . COPD (chronic obstructive pulmonary disease)   . Chronic schizophrenia   . Dementia   . Depressive disorder   . Acute sinusitis   . Schizophrenia   . Osteoporosis   . Stenosis of lumbosacral spine   . Glaucoma   . On home oxygen therapy     2 L PER NASAL CANNULA  . GERD (gastroesophageal reflux disease)   . Stroke     L hemiparesis  . Cervical spondylosis   . DM (diabetes mellitus)     DIET CONTROLLED  . Chronic kidney disease     chronic kidney disease per MD notes  . CHF (congestive heart failure)     chronic and stable per MD note  . Hyperlipidemia   . Hypertension   . Foley catheter in place   . Wheelchair dependent    . Breast cancer    Medication reviewed. See [facility]  Physical exam BP 138/67 mmHg  Pulse 72  Temp(Src) 98.2 F (36.8 C)  Resp 18  SpO2 96%  General- elderly female in no acute distress Head- atraumatic, normocephalic Eyes- PERRLA, EOMI, no pallor, no icterus, no discharge Neck- no lymphadenopathy Mouth- normal mucus membrane Cardiovascular- normal s1,s2, no murmurs, no leg edema Respiratory- bilateral clear to auscultation, no wheeze, no rhonchi, no crackles Abdomen- bowel sounds present, soft, non tender, suprapubic catheter in place and site clean but foley bag has dark colored urine and white deposits seen on catheter tube Musculoskeletal- left hemiplegia with left hand contracted, has a splint in place, works with restorative, OOB, hoyer transfer, no ankle or foot tenderness on exam Neurological- alert and oriented, no new focal deficit Skin- warm and dry Psychiatry- normal mood and affect  Labs 03/02/14 wbc 6.4, hb 10.6, hct 35.9, plt 171, ca 9.9, na 141, k 3.8, bun 12, cr 0.8, glu 82 03/31/14 t.chol 130, tg 120, ldl 69, hdl 37, a1c 5.8 06/29/14 wbc 6.5, hb 10, hct 33.2, plt 162, ca 9.6, na 139, k 3.9, cl 104, bun 13, cr 0.6, glu 84, a1c 5.6  Assessment/plan  Neuropathic pain Her foot pain is likely related to neuropathic pain with her hx of DM. Change gabapentin to 100 mg in am and 300 mg in pm for now and reassess  Neurogenic bladder Has suprapubic catheter, white  deposits in catheter tubing. Spoke with nursing supervisor Santiago Glad to get the tubing changed and for daily catheter care to reduce risk for UTI  Vascular dementia No behavioral disturbance reported, continue aricept 10 mg daily  Flaccid hemiplegia  Affecting non dominant side, continue aspirin and fall precautions  Schizophrenia Stable, continue prolixin  Depression Continue her zoloft, monitor clinically

## 2014-09-15 ENCOUNTER — Non-Acute Institutional Stay (SKILLED_NURSING_FACILITY): Payer: PRIVATE HEALTH INSURANCE | Admitting: Internal Medicine

## 2014-09-15 DIAGNOSIS — M792 Neuralgia and neuritis, unspecified: Secondary | ICD-10-CM | POA: Insufficient documentation

## 2014-09-15 DIAGNOSIS — M150 Primary generalized (osteo)arthritis: Secondary | ICD-10-CM | POA: Insufficient documentation

## 2014-09-15 DIAGNOSIS — M810 Age-related osteoporosis without current pathological fracture: Secondary | ICD-10-CM | POA: Insufficient documentation

## 2014-09-15 DIAGNOSIS — N319 Neuromuscular dysfunction of bladder, unspecified: Secondary | ICD-10-CM

## 2014-09-15 NOTE — Progress Notes (Signed)
Patient ID: [name], female   DOB: 1926/01/18, 80 y.o.   MRN: [id number]    Facility: [facility]  Chief complaint- routine visit  Allergies reviewed, lisinopril  HPI 80 y/o female patient is seen today for routine visit. She is on her bed, has o2 by nasal canula, appears comfortable. She had UTI and completed course of ciprofloxacin on 09/07/14. She has a foley in place. Her daytime course of neurontin has been discontinued. She is getting her evening dosing. She denies any concerns.She has a suprapubic catheter for her neurogenic bladder. She has dementia, cva with hemiplegia, copd on o2, ckd. She is alert and can make her needs known.   Review of Systems  Constitutional: Negative for fever, chills Eyes: Negative for blurred vision  Respiratory: Negative for cough, sputum production, shortness of breath and wheezing.  Cardiovascular: Negative for chest pain, palpitations.  Gastrointestinal: Negative for heartburn, nausea, vomiting, abdominal pain  Genitourinary: has suprapubic catheter Musculoskeletal: Negative for back pain, falls.  Skin: Negative for itching and rash.  Neurological: Negative for dizziness, headaches. has left sided weakness post cva Psychiatric/Behavioral: Negative for depression. Has dementia  Past Medical History  Diagnosis Date  . Anemia   . Arthritis   . COPD (chronic obstructive pulmonary disease)   . Chronic schizophrenia   . Dementia   . Depressive disorder   . Acute sinusitis   . Schizophrenia   . Osteoporosis   . Stenosis of lumbosacral spine   . Glaucoma   . On home oxygen therapy     2 L PER NASAL CANNULA  . GERD (gastroesophageal reflux disease)   . Stroke     L hemiparesis  . Cervical spondylosis   . DM (diabetes mellitus)     DIET CONTROLLED  . Chronic kidney disease     chronic kidney disease per MD notes  . CHF (congestive heart failure)     chronic and stable per MD note  . Hyperlipidemia   .  Hypertension   . Foley catheter in place   . Wheelchair dependent   . Breast cancer    Medication reviewed. See [facility]  Physical exam VSS, afebrile  General- elderly female in no acute distress, obese Head- atraumatic, normocephalic Eyes- PERRLA, EOMI, no pallor, no icterus, no discharge Neck- no lymphadenopathy Mouth- normal mucus membrane Cardiovascular- normal s1,s2, no murmurs, no leg edema Respiratory- bilateral clear to auscultation, no wheeze, no rhonchi, no crackles Abdomen- bowel sounds present, soft, non tender, suprapubic catheter in place and site clean Musculoskeletal- left hemiplegia with left hand contracted, has a splint in place, arthritis changes in digits, hoyer transfer Neurological- alert and oriented, no new focal deficit Skin- warm and dry Psychiatry- normal mood and affect  Labs 03/02/14 wbc 6.4, hb 10.6, hct 35.9, plt 171, ca 9.9, na 141, k 3.8, bun 12, cr 0.8, glu 82 03/31/14 t.chol 130, tg 120, ldl 69, hdl 37, a1c 5.8 06/29/14 wbc 6.5, hb 10, hct 33.2, plt 162, ca 9.6, na 139, k 3.9, cl 104, bun 13, cr 0.6, glu 84, a1c 5.6  Assessment/plan  OA Pain under control with current regimen of norco and tylenol extra strength  Senile osteoporosis Continue vit d supplement, fall precautions  Neurogenic bladder Recent UTI and completed course of antibiotic, continue foley care.   Neuropathic pain Continue gabapentin, current regimen helpful, monitor

## 2014-10-14 ENCOUNTER — Non-Acute Institutional Stay (SKILLED_NURSING_FACILITY): Payer: Medicare Other | Admitting: Internal Medicine

## 2014-10-14 DIAGNOSIS — N182 Chronic kidney disease, stage 2 (mild): Secondary | ICD-10-CM

## 2014-10-14 DIAGNOSIS — N319 Neuromuscular dysfunction of bladder, unspecified: Secondary | ICD-10-CM

## 2014-10-14 DIAGNOSIS — E1122 Type 2 diabetes mellitus with diabetic chronic kidney disease: Secondary | ICD-10-CM | POA: Insufficient documentation

## 2014-10-14 DIAGNOSIS — J9611 Chronic respiratory failure with hypoxia: Secondary | ICD-10-CM | POA: Insufficient documentation

## 2014-10-14 DIAGNOSIS — E1121 Type 2 diabetes mellitus with diabetic nephropathy: Secondary | ICD-10-CM

## 2014-10-14 NOTE — Progress Notes (Signed)
Patient ID: [name], female   DOB: Jul 01, 1926, 81 y.o.   MRN: [id number]    Facility: [facility]  Chief complaint- routine visit  Allergies reviewed, lisinopril  HPI 81 y/o female patient is seen today for routine visit. She is on her bed, has o2 by nasal canula, appears comfortable. a1c s/o controlled DM. She denies any concerns.She has a suprapubic catheter for her neurogenic bladder. She has dementia, cva with hemiplegia, copd on o2, ckd. She is alert and can make her needs known.   Review of Systems   Constitutional: Negative for fever, chills Eyes: Negative for blurred vision   Respiratory: Negative for cough, sputum production, shortness of breath and wheezing.   Cardiovascular: Negative for chest pain, palpitations.   Gastrointestinal: Negative for heartburn, nausea, vomiting, abdominal pain   Genitourinary: has suprapubic catheter Musculoskeletal: Negative for back pain, falls.   Skin: Negative for itching and rash.   Neurological: Negative for dizziness, headaches. has left sided weakness post cva Psychiatric/Behavioral: Negative for depression. Has dementia    Past Medical History  Diagnosis Date  . Anemia   . Arthritis   . COPD (chronic obstructive pulmonary disease)   . Chronic schizophrenia   . Dementia   . Depressive disorder   . Acute sinusitis   . Schizophrenia   . Osteoporosis   . Stenosis of lumbosacral spine   . Glaucoma   . On home oxygen therapy     2 L PER NASAL CANNULA  . GERD (gastroesophageal reflux disease)   . Stroke     L hemiparesis  . Cervical spondylosis   . DM (diabetes mellitus)     DIET CONTROLLED  . Chronic kidney disease     chronic kidney disease per MD notes  . CHF (congestive heart failure)     chronic and stable per MD note  . Hyperlipidemia   . Hypertension   . Foley catheter in place   . Wheelchair dependent   . Breast cancer    Medication reviewed. See [facility]  Physical exam BP  131/78 mmHg  Pulse 64  Temp(Src) 97.4 F (36.3 C)  Resp 18  SpO2 96%  General- elderly female in no acute distress, obese Head- atraumatic, normocephalic Eyes- PERRLA, EOMI, no pallor, no icterus, no discharge Neck- no lymphadenopathy Mouth- normal mucus membrane Cardiovascular- normal s1,s2, no murmurs, no leg edema Respiratory- bilateral clear to auscultation, no wheeze, no rhonchi, no crackles Abdomen- bowel sounds present, soft, non tender, suprapubic catheter in place and site clean Musculoskeletal- left hemiplegia with left hand contracted, has a splint in place, arthritis changes in digits, hoyer transfer Neurological- alert and oriented, no new focal deficit Skin- warm and dry Psychiatry- normal mood and affect  Labs 03/02/14 wbc 6.4, hb 10.6, hct 35.9, plt 171, ca 9.9, na 141, k 3.8, bun 12, cr 0.8, glu 82 03/31/14 t.chol 130, tg 120, ldl 69, hdl 37, a1c 5.8 06/29/14 wbc 6.5, hb 10, hct 33.2, plt 162, ca 9.6, na 139, k 3.9, cl 104, bun 13, cr 0.6, glu 84, a1c 5.6 09/19/14 wbc 5.6, hb 10.1, hct 33.8, plt 185, ca 9.8, a1c 5.9, na 140, k 4, bun 14, cr 0.6  Assessment/plan  Type 2 dm with diabetic nephorpathy Off all medication, monitor clinically, has microalbuminuria, off ACEI with cough. start losartan 25 mg daily and assess if she tolerates it as this will help with renal remodelling  ckd stage 2 See above, with her hx of DM.  Monitor renal function  Neurogenic bladder Has chronic suprapubic catheter, continue foley care.   Chronic respiratory failure On o2, hs hx of copd. continue pulmicort and prn duoneb/ albuterol

## 2014-11-28 ENCOUNTER — Non-Acute Institutional Stay (SKILLED_NURSING_FACILITY): Payer: Medicare Other | Admitting: Internal Medicine

## 2014-11-28 DIAGNOSIS — M810 Age-related osteoporosis without current pathological fracture: Secondary | ICD-10-CM

## 2014-11-28 DIAGNOSIS — N189 Chronic kidney disease, unspecified: Secondary | ICD-10-CM

## 2014-11-28 DIAGNOSIS — I130 Hypertensive heart and chronic kidney disease with heart failure and stage 1 through stage 4 chronic kidney disease, or unspecified chronic kidney disease: Secondary | ICD-10-CM | POA: Diagnosis not present

## 2014-11-28 DIAGNOSIS — E1142 Type 2 diabetes mellitus with diabetic polyneuropathy: Secondary | ICD-10-CM | POA: Diagnosis not present

## 2014-11-28 DIAGNOSIS — I5022 Chronic systolic (congestive) heart failure: Secondary | ICD-10-CM

## 2014-11-28 DIAGNOSIS — I509 Heart failure, unspecified: Secondary | ICD-10-CM

## 2014-12-22 ENCOUNTER — Other Ambulatory Visit: Payer: Self-pay | Admitting: *Deleted

## 2014-12-22 MED ORDER — HYDROCODONE-ACETAMINOPHEN 5-325 MG PO TABS: 0.5000 | ORAL_TABLET | Freq: Four times a day (QID) | ORAL | Status: DC | PRN

## 2014-12-22 NOTE — Telephone Encounter (Signed)
[facility] 

## 2015-01-09 ENCOUNTER — Non-Acute Institutional Stay (SKILLED_NURSING_FACILITY): Payer: Medicare Other | Admitting: Internal Medicine

## 2015-01-09 DIAGNOSIS — E1142 Type 2 diabetes mellitus with diabetic polyneuropathy: Secondary | ICD-10-CM | POA: Insufficient documentation

## 2015-01-09 DIAGNOSIS — N319 Neuromuscular dysfunction of bladder, unspecified: Secondary | ICD-10-CM | POA: Insufficient documentation

## 2015-01-09 DIAGNOSIS — I130 Hypertensive heart and chronic kidney disease with heart failure and stage 1 through stage 4 chronic kidney disease, or unspecified chronic kidney disease: Secondary | ICD-10-CM | POA: Insufficient documentation

## 2015-01-09 DIAGNOSIS — N390 Urinary tract infection, site not specified: Secondary | ICD-10-CM | POA: Diagnosis not present

## 2015-01-09 DIAGNOSIS — M792 Neuralgia and neuritis, unspecified: Secondary | ICD-10-CM | POA: Diagnosis not present

## 2015-01-09 DIAGNOSIS — I5022 Chronic systolic (congestive) heart failure: Secondary | ICD-10-CM | POA: Insufficient documentation

## 2015-01-09 DIAGNOSIS — F3340 Major depressive disorder, recurrent, in remission, unspecified: Secondary | ICD-10-CM

## 2015-01-09 DIAGNOSIS — B964 Proteus (mirabilis) (morganii) as the cause of diseases classified elsewhere: Secondary | ICD-10-CM | POA: Diagnosis not present

## 2015-01-09 NOTE — Progress Notes (Signed)
Patient ID: [name], female   DOB: 24-May-1926, 81 y.o.   MRN: [id number]    Facility: [place] [facility]  Chief complaint- routine visit  Allergies reviewed, lisinopril  Code status: full code  HPI 81 y/o female patient is seen today for routine visit. She is sitting on her chair, has o2 by nasal canula, appears comfortable. She denies any concerns.She has dementia, cva with hemiplegia, copd on o2, ckd. She is alert and can make her needs known. She had her suprapubic catheter changed on 12/15/14. She is tolerating increased dosing of neurontin well. Diet controlled diabetes, her zoloft dosing has been increased and she feels better. She is currently on ciprofloxacin for uti  Review of Systems   Constitutional: Negative for fever, chills Eyes: Negative for blurred vision   Respiratory: Negative for cough, sputum production, shortness of breath and wheezing.    Cardiovascular: Negative for chest pain, palpitations.   Gastrointestinal: Negative for heartburn, nausea, vomiting, abdominal pain   Genitourinary: has suprapubic catheter Musculoskeletal: Negative for back pain, falls.   Skin: Negative for itching and rash.   Neurological: Negative for dizziness, headaches. has left sided weakness post cva Psychiatric/Behavioral: Negative for depression. Has dementia  Past medical history reviewed  Medication reviewed. See [facility]  Physical exam BP 114/72 mmHg  Pulse 86  Temp(Src) 98.2 F (36.8 C)  Resp 18  Ht 5\' 9"  (1.753 m)  Wt 200 lb 9.6 oz (90.992 kg)  BMI 29.61 kg/m2   General- elderly female in no acute distress, obese Head- atraumatic, normocephalic Eyes- PERRLA, EOMI, no pallor, no icterus, no discharge Neck- no lymphadenopathy Mouth- normal mucus membrane Cardiovascular- normal s1,s2, no murmurs, no leg edema Respiratory- bilateral clear to auscultation, no wheeze, no rhonchi, no crackles Abdomen- bowel sounds present, soft, non tender,  suprapubic catheter in place and site clean Musculoskeletal- left hemiplegia with left hand contracted, has a splint in place, arthritis changes in digits, hoyer transfer, diminished pedal pulses Neurological- alert and oriented, no new focal deficit Skin- warm and dry Psychiatry- normal mood and affect  Labs 06/29/14 wbc 6.5, hb 10, hct 33.2, plt 162, ca 9.6, na 139, k 3.9, cl 104, bun 13, cr 0.6, glu 84, a1c 5.6 09/19/14 wbc 5.6, hb 10.1, hct 33.8, plt 185, ca 9.8, a1c 5.9, na 140, k 4, bun 14, cr 0.6 12/28/14 wbc 5.6, hb 10.6, hct 33.5, plt 174, na 139, k 4, bun 12, cr 0.64, alb 3.1, a1c 6.1 12/31/14 u/a- proteus mirabilis   Assessment/plan  Proteus uti Completed course of ciprofloxacin. Continue suprapubic catheter care. Encouraged hydration. Add cranberry supplement for uti prophylaxis  Depression Stable. Tolerating increased dosing of zoloft well. Continue zoloft 75 mg daily  Neurogenic bladder At higher risk for recurrent uti with her suprapubic catheter. F/u with dr Janice Norrie for monthly catheter change.  neuropathic pain Continue neurontin 100 mg am and afternoon and 300 mg at bedtime. Monitor clinically

## 2015-01-09 NOTE — Progress Notes (Signed)
Patient ID: [name], female   DOB: Nov 25, 1925, 81 y.o.   MRN: [id number]    Facility: [place] [facility]  Chief complaint- routine visit  Allergies reviewed, lisinopril  HPI 81 y/o female patient is seen today for routine visit. bp has been controlled. Diabetes under control with diet. No recent chf exacerbation. Arthritis pain under control. No concerns this visit  Review of Systems   Constitutional: Negative for fever, chills Respiratory: Negative for cough, sputum production, shortness of breath and wheezing.    Cardiovascular: Negative for chest pain, palpitations.   Gastrointestinal: Negative for heartburn, nausea, vomiting, abdominal pain   Skin: Negative for itching and rash.    Past Medical History  Diagnosis Date  . Anemia   . Arthritis   . COPD (chronic obstructive pulmonary disease)   . Chronic schizophrenia   . Dementia   . Depressive disorder   . Acute sinusitis   . Schizophrenia   . Osteoporosis   . Stenosis of lumbosacral spine   . Glaucoma   . On home oxygen therapy     2 L PER NASAL CANNULA  . GERD (gastroesophageal reflux disease)   . Stroke     L hemiparesis  . Cervical spondylosis   . DM (diabetes mellitus)     DIET CONTROLLED  . Chronic kidney disease     chronic kidney disease per MD notes  . CHF (congestive heart failure)     chronic and stable per MD note  . Hyperlipidemia   . Hypertension   . Foley catheter in place   . Wheelchair dependent   . Breast cancer     Medication reviewed. See [facility]  Physical exam BP 138/80 mmHg  Pulse 68  Temp(Src) 97 F (36.1 C)  Resp 18  General- elderly female in no acute distress, obese Head- atraumatic, normocephalic Eyes- PERRLA, EOMI, no pallor, no icterus, no discharge Cardiovascular- normal s1,s2, no murmurs, no leg edema Respiratory- bilateral clear to auscultation, no wheeze, no rhonchi, no crackles Abdomen- bowel sounds present, soft, non tender, suprapubic  catheter in place and site clean Musculoskeletal- left hemiplegia with left hand contracted, has a splint in place, arthritis changes in digits, hoyer transfer, diminished pedal pulses   Labs 06/29/14 wbc 6.5, hb 10, hct 33.2, plt 162, ca 9.6, na 139, k 3.9, cl 104, bun 13, cr 0.6, glu 84, a1c 5.6 09/19/14 wbc 5.6, hb 10.1, hct 33.8, plt 185, ca 9.8, a1c 5.9, na 140, k 4, bun 14, cr 0.6   Assessment/plan  chf No dyspnea or edema. On o2. Off medications with low bp reading. Monitor clinically  Hypertensive heart disease Stable, off all bp medications. Monitor clinically, bp reading stable  Type 2 dm with neuropathy Diet controlled. Off all hypoglycemic agent. On neurontin for nerve pain  Age related osteoporosis Continue vitamin d supplement

## 2015-01-23 ENCOUNTER — Non-Acute Institutional Stay (SKILLED_NURSING_FACILITY): Payer: Medicare Other | Admitting: Internal Medicine

## 2015-01-23 DIAGNOSIS — N390 Urinary tract infection, site not specified: Secondary | ICD-10-CM

## 2015-01-23 DIAGNOSIS — K219 Gastro-esophageal reflux disease without esophagitis: Secondary | ICD-10-CM | POA: Diagnosis not present

## 2015-01-23 DIAGNOSIS — I69959 Hemiplegia and hemiparesis following unspecified cerebrovascular disease affecting unspecified side: Secondary | ICD-10-CM | POA: Diagnosis not present

## 2015-01-23 DIAGNOSIS — I69391 Dysphagia following cerebral infarction: Secondary | ICD-10-CM

## 2015-01-23 DIAGNOSIS — R1310 Dysphagia, unspecified: Secondary | ICD-10-CM | POA: Insufficient documentation

## 2015-01-23 DIAGNOSIS — G8191 Hemiplegia, unspecified affecting right dominant side: Secondary | ICD-10-CM

## 2015-01-23 DIAGNOSIS — B9620 Unspecified Escherichia coli [E. coli] as the cause of diseases classified elsewhere: Secondary | ICD-10-CM

## 2015-01-23 DIAGNOSIS — IMO0002 Reserved for concepts with insufficient information to code with codable children: Secondary | ICD-10-CM

## 2015-01-23 NOTE — Progress Notes (Signed)
Patient ID: [name], female   DOB: [birth year], 81 y.o.   MRN: [id number]     Facility: [place] [facility]  Chief complaint- routine visit  Allergies reviewed, lisinopril  Code status: full code  HPI 81 y/o female patient is seen today for routine visit. She is lying in her bed and mentions being tired today. She had urine sample sent on 01/19/15 and it is positive for e.coli. She is currently on ceftriaxone 1 gm im daily x 3 days. She gets recurrent UTI. She was treated for uti last month. No specific concern from patient today.  She continues to be on o2. She has dementia, cva with hemiplegia, copd on o2, ckd. She is alert and can make her needs known. No new concern from staff.  Review of Systems   Constitutional: Negative for fever, chills. Positive for fatigue Eyes: Negative for blurred vision   Respiratory: Negative for cough, sputum production, shortness of breath and wheezing.    Cardiovascular: Negative for chest pain, palpitations.   Gastrointestinal: Negative for heartburn, nausea, vomiting, abdominal pain   Genitourinary: has suprapubic catheter Musculoskeletal: Negative for back pain, falls.   Skin: Negative for itching and rash.   Neurological: Negative for dizziness, headaches. Has left sided weakness post cva Psychiatric/Behavioral: Negative for depression. Has dementia  Past medical history reviewed  Medication reviewed. See [facility]  Physical exam BP 127/82 mmHg  Pulse 67  Temp(Src) 96.5 F (35.8 C)  Resp 18  Ht 5\' 9"  (1.753 m)  Wt 206 lb (93.441 kg)  BMI 30.41 kg/m2   General- elderly obese female in no acute distress Head- atraumatic, normocephalic Eyes- PERRLA, EOMI, no pallor, no icterus, no discharge Neck- no lymphadenopathy Mouth- normal mucus membrane Cardiovascular- normal s1,s2, no murmurs, no leg edema Respiratory- bilateral clear to auscultation, no wheeze, no rhonchi, no crackles Abdomen- bowel sounds present, soft,  non tender, suprapubic catheter in place and site clean Musculoskeletal- left hemiplegia with left hand contracted, has a splint in place, arthritis changes in digits, hoyer transfer, diminished pedal pulses Neurological- alert and oriented, no new focal deficit Skin- warm and dry, dry skin Psychiatry- normal mood and affect  Labs 06/29/14 wbc 6.5, hb 10, hct 33.2, plt 162, ca 9.6, na 139, k 3.9, cl 104, bun 13, cr 0.6, glu 84, a1c 5.6 09/19/14 wbc 5.6, hb 10.1, hct 33.8, plt 185, ca 9.8, a1c 5.9, na 140, k 4, bun 14, cr 0.6 12/28/14 wbc 5.6, hb 10.6, hct 33.5, plt 174, na 139, k 4, bun 12, cr 0.64, alb 3.1, a1c 6.1 12/31/14 u/a- proteus mirabilis 01/19/15 urine culture- e.coli  Assessment/plan  E.coli UTI Has indwelling suprapubic catheter increasing risk for recurrent uti. Reviewed sensitivity report. D/c ceftriaxone. Start ampicillin 500 mg qid x 5 days. Continue florastor. Will need catheter care. Continue cranberry supplement  Dysphagia Post cva, continue mechanical soft diet with aspiration precautions  CVA with left sided weakness Stable, continue aspirin 81 mg daily  gerd Decrease prilosec to 20 mg daily for now and reassess her symptoms   Blanchie Serve, MD  [facility] [phone] (Monday-Friday 8 am - 5 pm) [phone] (afterhours)

## 2015-03-02 ENCOUNTER — Non-Acute Institutional Stay (SKILLED_NURSING_FACILITY): Payer: Medicare Other | Admitting: Internal Medicine

## 2015-03-02 ENCOUNTER — Encounter: Payer: Self-pay | Admitting: Internal Medicine

## 2015-03-02 DIAGNOSIS — L89152 Pressure ulcer of sacral region, stage 2: Secondary | ICD-10-CM | POA: Diagnosis not present

## 2015-03-02 DIAGNOSIS — F0280 Dementia in other diseases classified elsewhere without behavioral disturbance: Secondary | ICD-10-CM

## 2015-03-02 DIAGNOSIS — N390 Urinary tract infection, site not specified: Secondary | ICD-10-CM

## 2015-03-02 DIAGNOSIS — F329 Major depressive disorder, single episode, unspecified: Secondary | ICD-10-CM | POA: Diagnosis not present

## 2015-03-02 DIAGNOSIS — B9620 Unspecified Escherichia coli [E. coli] as the cause of diseases classified elsewhere: Secondary | ICD-10-CM | POA: Insufficient documentation

## 2015-03-02 DIAGNOSIS — N319 Neuromuscular dysfunction of bladder, unspecified: Secondary | ICD-10-CM

## 2015-03-02 DIAGNOSIS — F0393 Unspecified dementia, unspecified severity, with mood disturbance: Secondary | ICD-10-CM

## 2015-03-02 DIAGNOSIS — M792 Neuralgia and neuritis, unspecified: Secondary | ICD-10-CM

## 2015-03-02 DIAGNOSIS — L98429 Non-pressure chronic ulcer of back with unspecified severity: Secondary | ICD-10-CM

## 2015-03-02 NOTE — Progress Notes (Signed)
Patient ID: [name], female   DOB: 1926-06-28, 81 y.o.   MRN: [id number]   [facility]  Code Status: Full Code   Chief Complaint  Patient presents with  . Medical Management of Chronic Issues    Routine Visit    Allergies reviewed, lisinopril   HPI 81 y/o female patient is seen today for routine visit. She is lying in her bed and denies any concerns. She is currently on rocephin for uti started 02/28/15. Her urine culture has reported today growing e.coli sensitive to rocephin. She gets recurrent UTI. She continues to be on o2. She has dementia, cva with hemiplegia, copd on o2, ckd. She is alert and can make her needs known. No new concern from staff.  Review of Systems   Constitutional: Negative for fever, chills.  Eyes: Negative for blurred vision   Respiratory: Negative for cough, shortness of breath and wheezing.    Cardiovascular: Negative for chest pain, palpitations.   Gastrointestinal: Negative for heartburn, nausea, vomiting, abdominal pain   Genitourinary: has suprapubic catheter Musculoskeletal: Negative for back pain, falls.   Skin: Negative for itching and rash.   Neurological: Negative for dizziness, headaches. Has left sided weakness post cva Psychiatric/Behavioral: Negative for depression. Has dementia  Past medical history reviewed  Medication reviewed.    Medication List       This list is accurate as of: 03/02/15  5:13 PM.  Always use your most recent med list.               acetaminophen 500 MG tablet  Commonly known as:  TYLENOL  Take 500 mg by mouth 3 (three) times daily.     albuterol (2.5 MG/3ML) 0.083% nebulizer solution  Commonly known as:  PROVENTIL  Take 2.5 mg by nebulization 3 (three) times daily.     aspirin 81 MG chewable tablet  Chew 81 mg by mouth daily.     bisacodyl 10 MG suppository  Commonly known as:  DULCOLAX  Place 1 suppository (10 mg total) rectally daily as needed for moderate constipation.     budesonide 0.5 MG/2ML nebulizer solution  Commonly known as:  PULMICORT  Take 0.5 mg by nebulization 2 (two) times daily.     cholecalciferol 1000 UNITS tablet  Commonly known as:  VITAMIN D  Take 2,000 Units by mouth daily.     Cranberry 250 MG Caps  Take by mouth daily. For UTI prevention     cycloSPORINE 0.05 % ophthalmic emulsion  Commonly known as:  RESTASIS  Place 1 drop into both eyes 2 (two) times daily.     donepezil 10 MG tablet  Commonly known as:  ARICEPT  Take 10 mg by mouth at bedtime.     FLUPHENAZINE DECANOATE IJ  Inject 25 mg/mL as directed every 30 (thirty) days. Once a month on the 19th.     fluticasone 50 MCG/ACT nasal spray  Commonly known as:  FLONASE  Place 2 sprays into the nose daily.     gabapentin 300 MG capsule  Commonly known as:  NEURONTIN  Take 300 mg by mouth at bedtime.     gabapentin 100 MG capsule  Commonly known as:  NEURONTIN  Take 100 mg by mouth 2 (two) times daily. At 9 am and 2 pm     HYDROcodone-acetaminophen 5-325 MG per tablet  Commonly known as:  NORCO/VICODIN  Take 0.5 tablets by mouth every 6 (six) hours as needed for moderate pain.  ipratropium-albuterol 0.5-2.5 (3) MG/3ML Soln  Commonly known as:  DUONEB  Take 3 mLs by nebulization every 6 (six) hours as needed (shortness of breath).     olopatadine 0.1 % ophthalmic solution  Commonly known as:  PATANOL  Place 1 drop into both eyes 2 (two) times daily.     omeprazole 20 MG capsule  Commonly known as:  PRILOSEC  Take 20 mg by mouth daily.     polyethylene glycol packet  Commonly known as:  MIRALAX / GLYCOLAX  Take 17 g by mouth daily.     ROCEPHIN 1 G injection  Generic drug:  cefTRIAXone  Inject 1 g into the muscle. X 7 days as of 02/28/15     saccharomyces boulardii 250 MG capsule  Commonly known as:  FLORASTOR  Take 250 mg by mouth 2 (two) times daily. X 21 days as of 02/28/15     sennosides-docusate sodium 8.6-50 MG tablet  Commonly known as:   SENOKOT-S  Take 2 tablets by mouth at bedtime.     sertraline 25 MG tablet  Commonly known as:  ZOLOFT  Take 25 mg by mouth daily. Along with 50 mg tablet for a total of 75 mg daily for depression     sertraline 50 MG tablet  Commonly known as:  ZOLOFT  Take 50 mg by mouth every morning.     VAGISIL MAXIMUM STRENGTH 1 % Misc  Generic drug:  Pramoxine HCl  Apply 1 application topically daily.     Vitamin B-12 1000 MCG Subl  Place 1,000 mcg under the tongue daily.        Physical exam BP 123/74 mmHg  Pulse 68  Temp(Src) 97.4 F (36.3 C) (Oral)  Resp 18  Ht 5\' 9"  (1.753 m)  Wt 206 lb (93.441 kg)  BMI 30.41 kg/m2  SpO2 97%   Wt Readings from Last 3 Encounters:  03/02/15 206 lb (93.441 kg)  01/23/15 206 lb (93.441 kg)  01/09/15 200 lb 9.6 oz (90.992 kg)   General- elderly obese female in no acute distress Head- atraumatic, normocephalic Eyes- PERRLA, EOMI, no pallor, no icterus, no discharge Neck- no lymphadenopathy Mouth- normal mucus membrane Cardiovascular- normal s1,s2, no murmurs, no leg edema Respiratory- bilateral clear to auscultation, no wheeze, no rhonchi, no crackles Abdomen- bowel sounds present, soft, non tender, suprapubic catheter in place and site clean Musculoskeletal- left hemiplegia with left hand contracted, has a splint in place, arthritis changes in digits, hoyer transfer, diminished pedal pulses Neurological- alert and oriented, no new focal deficit Skin- warm and dry, dry skin Psychiatry- normal mood and affect  Labs 06/29/14 wbc 6.5, hb 10, hct 33.2, plt 162, ca 9.6, na 139, k 3.9, cl 104, bun 13, cr 0.6, glu 84, a1c 5.6 09/19/14 wbc 5.6, hb 10.1, hct 33.8, plt 185, ca 9.8, a1c 5.9, na 140, k 4, bun 14, cr 0.6 12/28/14 wbc 5.6, hb 10.6, hct 33.5, plt 174, na 139, k 4, bun 12, cr 0.64, alb 3.1, a1c 6.1 12/31/14 u/a- proteus mirabilis 01/19/15 urine culture- e.coli 02/28/15 e.coli 03/02/15 wbc 5.9, hb 10.9, hct 34.6, plt 175, na 139, k 4.1, glu 86,  bun 13, cr 0.7, ca 9.8  Assessment/plan  E.coli UTI Has indwelling suprapubic catheter increasing risk for recurrent uti. Reviewed sensitivity report. Continue ceftriaxone 1 gm daily for 1 week. continue florastor. Will need catheter care. Continue cranberry supplement.  Stage 2 pressure ulcer Continue pressure ulcer treatment, pressure ulcer prophylaxis  Neurogenic bladder S/p suprapubic catheter, continue  catheter care.  Depression with dementia Stable mood, continue zoloft 75 mg daily and her aricept  Neuropathic pain Stable, continue gabapentin 100 mg bid and 300 mg bedtime   Blanchie Serve, MD  [facility] [phone] (Monday-Friday 8 am - 5 pm) [phone] (afterhours)

## 2015-03-27 LAB — BASIC METABOLIC PANEL
BUN: 12 mg/dL (ref 4–21)
CREATININE: 0.6 mg/dL (ref 0.5–1.1)
GLUCOSE: 129 mg/dL
POTASSIUM: 4.4 mmol/L (ref 3.4–5.3)
Sodium: 138 mmol/L (ref 137–147)

## 2015-03-27 LAB — LIPID PANEL
Cholesterol: 254 mg/dL — AB (ref 0–200)
HDL: 44 mg/dL (ref 35–70)
LDL Cholesterol: 185 mg/dL
Triglycerides: 123 mg/dL (ref 40–160)

## 2015-03-27 LAB — HEPATIC FUNCTION PANEL
ALK PHOS: 99 U/L (ref 25–125)
AST: 77 U/L — AB (ref 13–35)
Bilirubin, Total: 0.3 mg/dL

## 2015-03-27 LAB — HEMOGLOBIN A1C: HEMOGLOBIN A1C: 5.9 % (ref 4.0–6.0)

## 2015-03-27 LAB — CBC AND DIFFERENTIAL
HCT: 37 % (ref 36–46)
Hemoglobin: 11.7 g/dL — AB (ref 12.0–16.0)
Platelets: 201 10*3/uL (ref 150–399)
WBC: 7.5 10*3/mL

## 2015-03-27 LAB — TSH: TSH: 0.6 u[IU]/mL (ref 0.41–5.90)

## 2015-04-11 ENCOUNTER — Encounter: Payer: Self-pay | Admitting: Internal Medicine

## 2015-04-11 ENCOUNTER — Non-Acute Institutional Stay (SKILLED_NURSING_FACILITY): Payer: Medicare Other | Admitting: Internal Medicine

## 2015-04-11 DIAGNOSIS — E538 Deficiency of other specified B group vitamins: Secondary | ICD-10-CM

## 2015-04-11 DIAGNOSIS — N319 Neuromuscular dysfunction of bladder, unspecified: Secondary | ICD-10-CM

## 2015-04-11 DIAGNOSIS — E785 Hyperlipidemia, unspecified: Secondary | ICD-10-CM | POA: Diagnosis not present

## 2015-04-11 NOTE — Progress Notes (Signed)
Patient ID: [name], female   DOB: 06-Jan-1926, 81 y.o.   MRN: [id number]      [facility]  Code Status: Full Code   Chief Complaint  Patient presents with  . Medical Management of Chronic Issues    Routine Visit    Allergies reviewed, lisinopril   HPI 81 y/o female patient is seen today for routine visit. She denies any concern this visit. Mood has been good. she has been started on estrace vaginal cream. She continues to get suprapubic catheter care. Lipid panel has poorly controlled lipid level but family has refused treatment for this at present. She continues to be on o2. She has dementia, cva with hemiplegia, copd on o2, ckd. She is alert and can make her needs known. No new concern from staff.  Review of Systems   Constitutional: Negative for fever, chills.  Eyes: Negative for blurred vision   Respiratory: Negative for cough, shortness of breath and wheezing.    Cardiovascular: Negative for chest pain, palpitations.   Gastrointestinal: Negative for heartburn, nausea, vomiting, abdominal pain   Genitourinary: has suprapubic catheter Musculoskeletal: Negative for falls.   Skin: Negative for itching and rash.   Neurological: Negative for dizziness, headaches. Has left sided weakness post cva Psychiatric/Behavioral: Negative for depression. Has dementia  Past medical history reviewed  Medication reviewed.    Medication List       This list is accurate as of: 04/11/15  3:51 PM.  Always use your most recent med list.               acetaminophen 500 MG tablet  Commonly known as:  TYLENOL  Take 500 mg by mouth 3 (three) times daily.     albuterol (2.5 MG/3ML) 0.083% nebulizer solution  Commonly known as:  PROVENTIL  Take 2.5 mg by nebulization 3 (three) times daily.     aspirin 81 MG chewable tablet  Chew 81 mg by mouth daily.     budesonide 0.5 MG/2ML nebulizer solution  Commonly known as:  PULMICORT  Take 0.5 mg by nebulization 2 (two)  times daily.     Cranberry 250 MG Caps  Take by mouth daily. For UTI prevention     cycloSPORINE 0.05 % ophthalmic emulsion  Commonly known as:  RESTASIS  Place 1 drop into both eyes 2 (two) times daily.     donepezil 10 MG tablet  Commonly known as:  ARICEPT  Take 10 mg by mouth at bedtime.     estradiol 0.1 MG/GM vaginal cream  Commonly known as:  ESTRACE  Place 1 Applicatorful vaginally 2 (two) times a week.     FLUPHENAZINE DECANOATE IJ  Inject 25 mg/mL as directed every 30 (thirty) days. Once a month on the 19th.     fluticasone 50 MCG/ACT nasal spray  Commonly known as:  FLONASE  Place 2 sprays into the nose daily.     gabapentin 300 MG capsule  Commonly known as:  NEURONTIN  Take 300 mg by mouth at bedtime.     gabapentin 100 MG capsule  Commonly known as:  NEURONTIN  Take 100 mg by mouth 2 (two) times daily. At 9 am and 2 pm     HYDROcodone-acetaminophen 5-325 MG per tablet  Commonly known as:  NORCO/VICODIN  Take 0.5 tablets by mouth every 6 (six) hours as needed for moderate pain.     olopatadine 0.1 % ophthalmic solution  Commonly known as:  PATANOL  Place 1 drop  into both eyes 2 (two) times daily.     omeprazole 20 MG capsule  Commonly known as:  PRILOSEC  Take 20 mg by mouth daily.     polyethylene glycol packet  Commonly known as:  MIRALAX / GLYCOLAX  Take 17 g by mouth daily.     sennosides-docusate sodium 8.6-50 MG tablet  Commonly known as:  SENOKOT-S  Take 2 tablets by mouth 2 (two) times daily. HOLD FOR LOOSE STOOLS     sertraline 50 MG tablet  Commonly known as:  ZOLOFT  1 1/2 by mouth daily for depression     VAGISIL MAXIMUM STRENGTH 1 % Misc  Generic drug:  Pramoxine HCl  Apply 1 application topically daily.     vitamin B-12 500 MCG tablet  Commonly known as:  CYANOCOBALAMIN  Take 500 mcg by mouth daily.     vitamin C 500 MG tablet  Commonly known as:  ASCORBIC ACID  Take 500 mg by mouth daily. Supplement     Vitamin D3 2000  UNITS Tabs  Take by mouth daily. For vitamin D deficiency        Physical exam BP 140/66 mmHg  Pulse 79  Temp(Src) 97.2 F (36.2 C) (Oral)  Resp 20  Ht 5\' 9"  (1.753 m)  Wt 210 lb 11.2 oz (95.573 kg)  BMI 31.10 kg/m2  SpO2 95%   Wt Readings from Last 3 Encounters:  04/11/15 210 lb 11.2 oz (95.573 kg)  03/02/15 206 lb (93.441 kg)  01/23/15 206 lb (93.441 kg)   General- elderly obese female in no acute distress Head- atraumatic, normocephalic Eyes- PERRLA, EOMI, no pallor, no icterus, no discharge Neck- no lymphadenopathy Mouth- normal mucus membrane Cardiovascular- normal s1,s2, no murmurs, no leg edema Respiratory- bilateral clear to auscultation, no wheeze, no rhonchi, no crackles Abdomen- bowel sounds present, soft, non tender, suprapubic catheter in place and site clean Musculoskeletal- left hemiplegia with left hand contracted, has a splint in place, arthritis changes in digits, hoyer transfer, diminished pedal pulses Neurological- alert and oriented, no new focal deficit Skin- warm and dry, dry skin Psychiatry- normal mood and affect  Labs 06/29/14 wbc 6.5, hb 10, hct 33.2, plt 162, ca 9.6, na 139, k 3.9, cl 104, bun 13, cr 0.6, glu 84, a1c 5.6 09/19/14 wbc 5.6, hb 10.1, hct 33.8, plt 185, ca 9.8, a1c 5.9, na 140, k 4, bun 14, cr 0.6 12/28/14 wbc 5.6, hb 10.6, hct 33.5, plt 174, na 139, k 4, bun 12, cr 0.64, alb 3.1, a1c 6.1 12/31/14 u/a- proteus mirabilis 01/19/15 urine culture- e.coli 02/28/15 e.coli 03/02/15 wbc 5.9, hb 10.9, hct 34.6, plt 175, na 139, k 4.1, glu 86, bun 13, cr 0.7, ca 9.8 03/27/15 b12 1195, wbc 7.5, hb 11.7, hct 36.7, plt 201, na 138, k 4.4, bun 12, cr 0.63, glu 129, chol 254, tg 123, ldl 185, hdl 44, tsh 0.60, a1c 5.9  Assessment/plan  Hyperlipidemia Worsening lipid but family defer statin due to hx of intolerance to statin in past. Monitor clinically  b12 def Stable, continue b12 500 mg daily and recheck level in 6 months  Neurogenic  bladder Ongoing, S/p suprapubic catheter, continue catheter care and urology follow up   [facility], MD  [facility] [phone] (Monday-Friday 8 am - 5 pm) [phone] (afterhours)

## 2015-04-15 DIAGNOSIS — E538 Deficiency of other specified B group vitamins: Secondary | ICD-10-CM | POA: Insufficient documentation

## 2015-04-15 DIAGNOSIS — E785 Hyperlipidemia, unspecified: Secondary | ICD-10-CM | POA: Insufficient documentation

## 2015-05-16 ENCOUNTER — Non-Acute Institutional Stay (SKILLED_NURSING_FACILITY): Payer: Medicare Other | Admitting: Internal Medicine

## 2015-05-16 DIAGNOSIS — M810 Age-related osteoporosis without current pathological fracture: Secondary | ICD-10-CM

## 2015-05-16 DIAGNOSIS — G8104 Flaccid hemiplegia affecting left nondominant side: Secondary | ICD-10-CM

## 2015-05-16 DIAGNOSIS — K219 Gastro-esophageal reflux disease without esophagitis: Secondary | ICD-10-CM | POA: Diagnosis not present

## 2015-05-16 DIAGNOSIS — J449 Chronic obstructive pulmonary disease, unspecified: Secondary | ICD-10-CM | POA: Diagnosis not present

## 2015-05-16 DIAGNOSIS — M150 Primary generalized (osteo)arthritis: Secondary | ICD-10-CM | POA: Diagnosis not present

## 2015-05-16 DIAGNOSIS — J9611 Chronic respiratory failure with hypoxia: Secondary | ICD-10-CM | POA: Diagnosis not present

## 2015-05-16 NOTE — Progress Notes (Signed)
Patient ID: [name], female   DOB: [birth year], 81 y.o.   MRN: [id number]    [facility]  Code Status: Full Code   Chief Complaint  Patient presents with  . Medical Management of Chronic Issues   Allergies reviewed, lisinopril   HPI 81 y/o female patient is seen today for routine visit. She is in her room on her bed. she denies any concern this visit. Mood has been good. No concerns from staff. Her OA pain is under control with current regimen. No new fall or fracture reported. Her breathing has been stable with no recent copd exacerbation. On o2 by Nectar. Reflux symptom is controlled  Review of Systems   Constitutional: Negative for fever, chills.  Eyes: Negative for blurred vision   Respiratory: Negative for cough, shortness of breath and wheezing.    Cardiovascular: Negative for chest pain, palpitations.   Gastrointestinal: Negative for heartburn, nausea, vomiting, abdominal pain   Genitourinary: has suprapubic catheter Musculoskeletal: Negative for falls.   Skin: Negative for itching and rash.   Neurological: Negative for dizziness, headaches. Has left sided weakness post cva Psychiatric/Behavioral: Negative for depression. Has dementia  Past medical history reviewed  Medication reviewed.    Medication List       This list is accurate as of: 05/16/15 11:59 PM.  Always use your most recent med list.               acetaminophen 500 MG tablet  Commonly known as:  TYLENOL  Take 500 mg by mouth 3 (three) times daily.     albuterol (2.5 MG/3ML) 0.083% nebulizer solution  Commonly known as:  PROVENTIL  Take 2.5 mg by nebulization 3 (three) times daily.     aspirin 81 MG chewable tablet  Chew 81 mg by mouth daily.     budesonide 0.5 MG/2ML nebulizer solution  Commonly known as:  PULMICORT  Take 0.5 mg by nebulization 2 (two) times daily.     Cranberry 250 MG Caps  Take by mouth daily. For UTI prevention     cycloSPORINE 0.05 % ophthalmic  emulsion  Commonly known as:  RESTASIS  Place 1 drop into both eyes 2 (two) times daily.     donepezil 10 MG tablet  Commonly known as:  ARICEPT  Take 10 mg by mouth at bedtime.     estradiol 0.1 MG/GM vaginal cream  Commonly known as:  ESTRACE  Place 1 Applicatorful vaginally 2 (two) times a week.     FLUPHENAZINE DECANOATE IJ  Inject 25 mg/mL as directed every 30 (thirty) days. Once a month on the 19th.     fluticasone 50 MCG/ACT nasal spray  Commonly known as:  FLONASE  Place 2 sprays into the nose daily.     gabapentin 300 MG capsule  Commonly known as:  NEURONTIN  Take 300 mg by mouth at bedtime.     gabapentin 100 MG capsule  Commonly known as:  NEURONTIN  Take 100 mg by mouth 2 (two) times daily. At 9 am and 2 pm     HYDROcodone-acetaminophen 5-325 MG per tablet  Commonly known as:  NORCO/VICODIN  Take 0.5 tablets by mouth every 6 (six) hours as needed for moderate pain.     olopatadine 0.1 % ophthalmic solution  Commonly known as:  PATANOL  Place 1 drop into both eyes 2 (two) times daily.     omeprazole 20 MG capsule  Commonly known as:  PRILOSEC  Take 20 mg  by mouth daily.     polyethylene glycol packet  Commonly known as:  MIRALAX / GLYCOLAX  Take 17 g by mouth daily.     sennosides-docusate sodium 8.6-50 MG tablet  Commonly known as:  SENOKOT-S  Take 2 tablets by mouth 2 (two) times daily. HOLD FOR LOOSE STOOLS     sertraline 50 MG tablet  Commonly known as:  ZOLOFT  1 1/2 by mouth daily for depression     VAGISIL MAXIMUM STRENGTH 1 % Misc  Generic drug:  Pramoxine HCl  Apply 1 application topically daily.     vitamin B-12 500 MCG tablet  Commonly known as:  CYANOCOBALAMIN  Take 500 mcg by mouth daily.     vitamin C 500 MG tablet  Commonly known as:  ASCORBIC ACID  Take 500 mg by mouth daily. Supplement     Vitamin D3 2000 UNITS Tabs  Take by mouth daily. For vitamin D deficiency        Physical exam BP 148/78 mmHg  Pulse 65   Temp(Src) 97.7 F (36.5 C)  Resp 18   Wt Readings from Last 3 Encounters:  04/11/15 210 lb 11.2 oz (95.573 kg)  03/02/15 206 lb (93.441 kg)  01/23/15 206 lb (93.441 kg)   General- elderly obese female in no acute distress Head- atraumatic, normocephalic Eyes- PERRLA, EOMI, no pallor, no icterus, no discharge Neck- no lymphadenopathy Mouth- normal mucus membrane Cardiovascular- normal s1,s2, no murmurs, no leg edema Respiratory- bilateral clear to auscultation, no wheeze, no rhonchi, no crackles Abdomen- bowel sounds present, soft, non tender, suprapubic catheter in place and site clean Musculoskeletal- left hemiplegia with left hand contracted, left 4th and 5th finger contracted, has a splint in place, arthritis changes in digits, hoyer transfer, diminished pedal pulses Neurological- alert and oriented, no new focal deficit Skin- warm and dry, dry skin Psychiatry- normal mood and affect  Labs 06/29/14 wbc 6.5, hb 10, hct 33.2, plt 162, ca 9.6, na 139, k 3.9, cl 104, bun 13, cr 0.6, glu 84, a1c 5.6 09/19/14 wbc 5.6, hb 10.1, hct 33.8, plt 185, ca 9.8, a1c 5.9, na 140, k 4, bun 14, cr 0.6 12/28/14 wbc 5.6, hb 10.6, hct 33.5, plt 174, na 139, k 4, bun 12, cr 0.64, alb 3.1, a1c 6.1 12/31/14 u/a- proteus mirabilis 01/19/15 urine culture- e.coli 02/28/15 e.coli 03/02/15 wbc 5.9, hb 10.9, hct 34.6, plt 175, na 139, k 4.1, glu 86, bun 13, cr 0.7, ca 9.8 03/27/15 b12 1195, wbc 7.5, hb 11.7, hct 36.7, plt 201, na 138, k 4.4, bun 12, cr 0.63, glu 129, chol 254, tg 123, ldl 185, hdl 44, tsh 0.60, a1c 5.9  Assessment/plan  Primary generalized OA Stable, continue norco 5-325 and tylenol extra strength current regimen and continue senna for bowel movement  Age related osteoporosis Continue vitamin d supplement. No falls or fracture. Monitor  Flaccid hemiplegia Post cva. Chronic, stable. Under total care. Continue her aspirin  COPD Stable, continue o2 with her pulmicort and prn duoneb  Chronic  respiratory failure From copd, continue o2 and bronchodilator treament. Monitor clinically  gerd Stable, continue her omeprazole    Blanchie Serve, MD  [facility] [place] [facility] [phone] (Monday-Friday 8 am - 5 pm) [phone] (afterhours)

## 2015-06-08 ENCOUNTER — Encounter: Payer: Self-pay | Admitting: Internal Medicine

## 2015-06-08 ENCOUNTER — Non-Acute Institutional Stay (SKILLED_NURSING_FACILITY): Payer: Medicare Other | Admitting: Internal Medicine

## 2015-06-08 DIAGNOSIS — E1122 Type 2 diabetes mellitus with diabetic chronic kidney disease: Secondary | ICD-10-CM | POA: Diagnosis not present

## 2015-06-08 DIAGNOSIS — J449 Chronic obstructive pulmonary disease, unspecified: Secondary | ICD-10-CM

## 2015-06-08 DIAGNOSIS — M792 Neuralgia and neuritis, unspecified: Secondary | ICD-10-CM

## 2015-06-08 DIAGNOSIS — N182 Chronic kidney disease, stage 2 (mild): Secondary | ICD-10-CM

## 2015-06-08 NOTE — Progress Notes (Signed)
Patient ID: [name], female   DOB: 06/04/1926, 81 y.o.   MRN: [id number]     [name]  Code Status: Full Code   Chief Complaint  Patient presents with  . Medical Management of Chronic Issues    Routine Visit    Allergies reviewed: lisinopril   HPI 81 y/o female patient is seen today for routine visit. She has been at her baseline. She was treated with a dose of diflucan for groin candidiasis. She has suprapubic catheter in place, no UTI this month. She is in her room on her bed. she denies any concern this visit.   Review of Systems   Constitutional: Negative for fever, chills.  Eyes: Negative for blurred vision   Respiratory: Negative for cough, shortness of breath and wheezing.    Cardiovascular: Negative for chest pain, palpitations.   Gastrointestinal: Negative for heartburn, nausea, vomiting, abdominal pain   Genitourinary: has suprapubic catheter Musculoskeletal: Negative for falls.   Skin: Negative for itching and rash.   Neurological: Negative for dizziness, headaches. Has left sided weakness post cva Psychiatric/Behavioral: Negative for depression. Has dementia  Past medical history reviewed  Medication reviewed.    Medication List       This list is accurate as of: 06/08/15  3:22 PM.  Always use your most recent med list.               acetaminophen 500 MG tablet  Commonly known as:  TYLENOL  Take 500 mg by mouth 3 (three) times daily.     albuterol (2.5 MG/3ML) 0.083% nebulizer solution  Commonly known as:  PROVENTIL  Take 2.5 mg by nebulization 3 (three) times daily.     aspirin 81 MG chewable tablet  Chew 81 mg by mouth daily.     budesonide 0.5 MG/2ML nebulizer solution  Commonly known as:  PULMICORT  Take 0.5 mg by nebulization 2 (two) times daily.     cycloSPORINE 0.05 % ophthalmic emulsion  Commonly known as:  RESTASIS  Place 1 drop into both eyes 2 (two) times daily.     donepezil 10 MG tablet  Commonly known as:   ARICEPT  Take 10 mg by mouth at bedtime.     estradiol 0.1 MG/GM vaginal cream  Commonly known as:  ESTRACE  Place 1 Applicatorful vaginally 2 (two) times a week.     FLUPHENAZINE DECANOATE IJ  Inject 25 mg/mL as directed every 30 (thirty) days. Once a month on the 19th.     fluticasone 50 MCG/ACT nasal spray  Commonly known as:  FLONASE  Place 2 sprays into the nose daily.     gabapentin 300 MG capsule  Commonly known as:  NEURONTIN  Take 300 mg by mouth at bedtime.     gabapentin 100 MG capsule  Commonly known as:  NEURONTIN  Take 100 mg by mouth 2 (two) times daily. At 9 am and 2 pm     HYDROcodone-acetaminophen 5-325 MG per tablet  Commonly known as:  NORCO/VICODIN  Take 0.5 tablets by mouth every 6 (six) hours as needed for moderate pain.     olopatadine 0.1 % ophthalmic solution  Commonly known as:  PATANOL  Place 1 drop into both eyes 2 (two) times daily.     omeprazole 20 MG capsule  Commonly known as:  PRILOSEC  Take 20 mg by mouth daily.     polyethylene glycol packet  Commonly known as:  MIRALAX / GLYCOLAX  Take 17  g by mouth daily.     sennosides-docusate sodium 8.6-50 MG tablet  Commonly known as:  SENOKOT-S  Take 2 tablets by mouth 2 (two) times daily. HOLD FOR LOOSE STOOLS     sertraline 50 MG tablet  Commonly known as:  ZOLOFT  1 1/2 by mouth daily for depression     VAGISIL MAXIMUM STRENGTH 1 % Misc  Generic drug:  Pramoxine HCl  Apply 1 application topically daily.     vitamin B-12 500 MCG tablet  Commonly known as:  CYANOCOBALAMIN  Take 500 mcg by mouth daily.     vitamin C 500 MG tablet  Commonly known as:  ASCORBIC ACID  Take 500 mg by mouth daily. Supplement     Vitamin D3 2000 UNITS Tabs  Take by mouth daily. For vitamin D deficiency        Physical exam BP 138/74 mmHg  Pulse 78  Resp 16   Wt Readings from Last 3 Encounters:  04/11/15 210 lb 11.2 oz (95.573 kg)  03/02/15 206 lb (93.441 kg)  01/23/15 206 lb (93.441 kg)    General- elderly obese female in no acute distress Head- atraumatic, normocephalic Eyes- PERRLA, EOMI, no pallor, no icterus, no discharge Neck- no lymphadenopathy Mouth- normal mucus membrane Cardiovascular- normal s1,s2, no murmurs, no leg edema Respiratory- bilateral clear to auscultation, no wheeze, no rhonchi, no crackles Abdomen- bowel sounds present, soft, non tender, suprapubic catheter in place and site clean Musculoskeletal- left hemiplegia with left hand contracted, left 4th and 5th finger contracted, has a splint in place, arthritis changes in digits, hoyer transfer, diminished pedal pulses Neurological- alert and oriented, no new focal deficit Psychiatry- normal mood and affect  Labs 06/29/14 wbc 6.5, hb 10, hct 33.2, plt 162, ca 9.6, na 139, k 3.9, cl 104, bun 13, cr 0.6, glu 84, a1c 5.6 09/19/14 wbc 5.6, hb 10.1, hct 33.8, plt 185, ca 9.8, a1c 5.9, na 140, k 4, bun 14, cr 0.6 12/28/14 wbc 5.6, hb 10.6, hct 33.5, plt 174, na 139, k 4, bun 12, cr 0.64, alb 3.1, a1c 6.1 12/31/14 u/a- proteus mirabilis 01/19/15 urine culture- e.coli 02/28/15 e.coli 03/02/15 wbc 5.9, hb 10.9, hct 34.6, plt 175, na 139, k 4.1, glu 86, bun 13, cr 0.7, ca 9.8 03/27/15 b12 1195, wbc 7.5, hb 11.7, hct 36.7, plt 201, na 138, k 4.4, bun 12, cr 0.63, glu 129, chol 254, tg 123, ldl 185, hdl 44, tsh 0.60, a1c 5.9  Assessment/plan  COPD Stable, continue o2 with her pulmicort and prn duoneb  Neuropathic pain Stable, continue gabapentin 100 mg bid with 300 mg at bedtime  ckd stage 2 With hx of dm, reviewed a1c from 7/16. Check urine microalbumin to assess for microalbuminuria. Avoid NSAIDS. Monitor bp reading   Olivia Serve, MD  [facility] [phone] (Monday-Friday 8 am - 5 pm) [phone] (afterhours)

## 2015-07-18 LAB — MICROALBUMIN, URINE: MICROALB UR: 5994.7

## 2015-07-29 LAB — CBC AND DIFFERENTIAL
HEMATOCRIT: 35 % — AB (ref 36–46)
HEMOGLOBIN: 11.2 g/dL — AB (ref 12.0–16.0)
Platelets: 199 10*3/uL (ref 150–399)
WBC: 5.9 10^3/mL

## 2015-07-29 LAB — BASIC METABOLIC PANEL
BUN: 12 mg/dL (ref 4–21)
Creatinine: 0.6 mg/dL (ref 0.5–1.1)
GLUCOSE: 131 mg/dL
POTASSIUM: 3.4 mmol/L (ref 3.4–5.3)
SODIUM: 143 mmol/L (ref 137–147)

## 2015-07-31 LAB — HEMOGLOBIN A1C: HEMOGLOBIN A1C: 7

## 2015-10-04 LAB — HM DIABETES FOOT EXAM

## 2015-10-11 LAB — HM DIABETES EYE EXAM

## 2015-10-13 ENCOUNTER — Non-Acute Institutional Stay (SKILLED_NURSING_FACILITY): Payer: Medicare Other | Admitting: Internal Medicine

## 2015-10-13 ENCOUNTER — Encounter: Payer: Self-pay | Admitting: Internal Medicine

## 2015-10-13 DIAGNOSIS — M792 Neuralgia and neuritis, unspecified: Secondary | ICD-10-CM

## 2015-10-13 DIAGNOSIS — F0150 Vascular dementia without behavioral disturbance: Secondary | ICD-10-CM

## 2015-10-13 DIAGNOSIS — F209 Schizophrenia, unspecified: Secondary | ICD-10-CM

## 2015-10-13 DIAGNOSIS — I5022 Chronic systolic (congestive) heart failure: Secondary | ICD-10-CM | POA: Diagnosis not present

## 2015-10-13 NOTE — Progress Notes (Signed)
Patient ID: [name], female   DOB: 16-Oct-1925, 82 y.o.   MRN: [id number]     [facility]  Code Status: Full Code   Chief Complaint  Patient presents with  . Medical Management of Chronic Issues    Routine Visit   Allergies reviewed: lisinopril   HPI 82 y/o female patient is seen today for routine visit. She has been at her baseline. She denies any concerns this time. She had her diabetic eye exam. BP reading stable today. Breathing has been stable and continues to be on oxygen.   Review of Systems   Constitutional: Negative for fever, chills.  Eyes: Negative for blurred vision   Respiratory: Negative for cough, shortness of breath and wheezing.    Cardiovascular: Negative for chest pain, palpitations.   Gastrointestinal: Negative for heartburn, nausea, vomiting, abdominal pain   Genitourinary: has suprapubic catheter Musculoskeletal: Negative for falls.   Skin: Negative for itching and rash.   Neurological: Negative for dizziness. Has left sided weakness post cva Psychiatric/Behavioral: Negative for depression. Has dementia  Past medical history reviewed  Medication reviewed.    Medication List       This list is accurate as of: 10/13/15  3:07 PM.  Always use your most recent med list.               acetaminophen 500 MG tablet  Commonly known as:  TYLENOL  Take 500 mg by mouth 3 (three) times daily.     albuterol (2.5 MG/3ML) 0.083% nebulizer solution  Commonly known as:  PROVENTIL  Take 2.5 mg by nebulization 3 (three) times daily.     aspirin 81 MG chewable tablet  Chew 81 mg by mouth daily.     budesonide 0.5 MG/2ML nebulizer solution  Commonly known as:  PULMICORT  Take 0.5 mg by nebulization 2 (two) times daily.     cycloSPORINE 0.05 % ophthalmic emulsion  Commonly known as:  RESTASIS  Place 1 drop into both eyes 2 (two) times daily.     donepezil 10 MG tablet  Commonly known as:  ARICEPT  Take 10 mg by mouth at bedtime.     estradiol 0.1 MG/GM vaginal cream  Commonly known as:  ESTRACE  Place 1 Applicatorful vaginally 2 (two) times a week.     FLUPHENAZINE DECANOATE IJ  Inject 25 mg/mL as directed every 30 (thirty) days. Once a month on the 19th.     fluticasone 50 MCG/ACT nasal spray  Commonly known as:  FLONASE  Place 2 sprays into the nose daily.     gabapentin 300 MG capsule  Commonly known as:  NEURONTIN  Take 300 mg by mouth at bedtime.     gabapentin 100 MG capsule  Commonly known as:  NEURONTIN  Take 100 mg by mouth 2 (two) times daily. At 9 am and 2 pm     hydrALAZINE 10 MG tablet  Commonly known as:  APRESOLINE  Take 10 mg by mouth 3 (three) times daily.     HYDROcodone-acetaminophen 5-325 MG tablet  Commonly known as:  NORCO/VICODIN  Take 0.5 tablets by mouth every 6 (six) hours as needed for moderate pain.     olopatadine 0.1 % ophthalmic solution  Commonly known as:  PATANOL  Place 1 drop into both eyes 2 (two) times daily.     omeprazole 20 MG capsule  Commonly known as:  PRILOSEC  Take 20 mg by mouth daily.     OXYGEN  Inhale  2 L into the lungs.     polyethylene glycol packet  Commonly known as:  MIRALAX / GLYCOLAX  Take 17 g by mouth daily.     sennosides-docusate sodium 8.6-50 MG tablet  Commonly known as:  SENOKOT-S  Take 2 tablets by mouth 2 (two) times daily. HOLD FOR LOOSE STOOLS     sertraline 50 MG tablet  Commonly known as:  ZOLOFT  1 1/2 by mouth daily for depression     SYSTANE BALANCE OP  Apply to eye. 1 drop in both eyes twice a day     VAGISIL MAXIMUM STRENGTH 1 % Misc  Generic drug:  Pramoxine HCl  Apply 1 application topically daily.     vitamin B-12 500 MCG tablet  Commonly known as:  CYANOCOBALAMIN  Take 500 mcg by mouth daily.     vitamin C 500 MG tablet  Commonly known as:  ASCORBIC ACID  Take 500 mg by mouth daily. Supplement     Vitamin D3 2000 units Tabs  Take by mouth daily. For vitamin D deficiency        Physical exam BP  140/72 mmHg  Pulse 82  Temp(Src) 97.5 F (36.4 C) (Oral)  Resp 18  Ht 5\' 9"  (1.753 m)  Wt 210 lb (95.255 kg)  BMI 31.00 kg/m2  SpO2 98%   Wt Readings from Last 3 Encounters:  10/13/15 210 lb (95.255 kg)  04/11/15 210 lb 11.2 oz (95.573 kg)  03/02/15 206 lb (93.441 kg)   General- elderly obese female in no acute distress Head- atraumatic, normocephalic Eyes- PERRLA, EOMI, no pallor, no icterus, no discharge Neck- no lymphadenopathy Mouth- normal mucus membrane Cardiovascular- normal s1,s2, no murmurs, trace leg edema Respiratory- bilateral clear to auscultation, no wheeze, no rhonchi, no crackles Abdomen- bowel sounds present, soft, non tender, suprapubic catheter in place and site clean Musculoskeletal- left hemiplegia with left hand contracted, left 4th and 5th finger contracted, has a splint in place, arthritis changes in digits, hoyer transfer, diminished pedal pulses Neurological- alert and oriented, no new focal deficit Psychiatry- normal mood and affect  Labs   CBC Latest Ref Rng 07/29/2015 03/27/2015 06/29/2014  WBC - 5.9 7.5 6.5  Hemoglobin 12.0 - 16.0 g/dL 11.2(A) 11.7(A) 10.0(A)  Hematocrit 36 - 46 % 35(A) 37 33(A)  Platelets 150 - 399 K/L 199 201 162   CMP Latest Ref Rng 07/29/2015 03/27/2015 06/29/2014  Glucose 70 - 99 mg/dL - - -  BUN 4 - 21 mg/dL 12 12 13   Creatinine 0.5 - 1.1 mg/dL 0.6 0.6 0.3(A)  Sodium 137 - 147 mmol/L 143 138 139  Potassium 3.4 - 5.3 mmol/L 3.4 4.4 3.9  Chloride 96 - 112 mEq/L - - -  CO2 19 - 32 mEq/L - - -  Calcium 8.4 - 10.5 mg/dL - - -  Total Protein 6.0 - 8.3 g/dL - - -  Total Bilirubin 0.3 - 1.2 mg/dL - - -  Alkaline Phos 25 - 125 U/L - 99 83  AST 13 - 35 U/L - 77(A) 9(A)  ALT 7 - 35 U/L - - 7   Lab Results  Component Value Date   HGBA1C 7.0 07/31/2015    Assessment/plan  Chronic systolic CHF Stable breathing, continue o2. Continue hydralazine 10 mg tid  Vascular Dementia  Continue aricept 10 mg daily for dementia.  Continue zoloft for depression  Schizophrenia Continue fluphenazine  Neuropathic pain Stable, continue gabapentin 100 mg bid with 300 mg at bedtime    [place],  MD  [place] (Monday-Friday 8 am - 5 pm) [phone] (afterhours)

## 2015-10-31 LAB — CBC AND DIFFERENTIAL
HEMATOCRIT: 35 % — AB (ref 36–46)
Hemoglobin: 10.4 g/dL — AB (ref 12.0–16.0)
PLATELETS: 168 10*3/uL (ref 150–399)
WBC: 6.5 10*3/mL

## 2015-11-10 ENCOUNTER — Non-Acute Institutional Stay (SKILLED_NURSING_FACILITY): Payer: Medicare Other | Admitting: Internal Medicine

## 2015-11-10 ENCOUNTER — Encounter: Payer: Self-pay | Admitting: Internal Medicine

## 2015-11-10 DIAGNOSIS — I69359 Hemiplegia and hemiparesis following cerebral infarction affecting unspecified side: Secondary | ICD-10-CM | POA: Diagnosis not present

## 2015-11-10 DIAGNOSIS — K5900 Constipation, unspecified: Secondary | ICD-10-CM

## 2015-11-10 DIAGNOSIS — IMO0002 Reserved for concepts with insufficient information to code with codable children: Secondary | ICD-10-CM

## 2015-11-10 DIAGNOSIS — N390 Urinary tract infection, site not specified: Secondary | ICD-10-CM | POA: Diagnosis not present

## 2015-11-10 DIAGNOSIS — K5909 Other constipation: Secondary | ICD-10-CM | POA: Insufficient documentation

## 2015-11-10 DIAGNOSIS — I10 Essential (primary) hypertension: Secondary | ICD-10-CM

## 2015-11-10 NOTE — Progress Notes (Signed)
Patient ID: [name], female   DOB: 1925/11/19, 82 y.o.   MRN: [id number]     [facility]  Code Status: Full Code   Chief Complaint  Patient presents with  . Medical Management of Chronic Issues    Routine Visit   Allergies reviewed: lisinopril   HPI 82 y/o female patient is seen today for routine visit. She is currently being treated for urinary tract infection. bp reading improved with hydralazine dosing adjustment. She has been at her baseline. She denies any concerns this time. Currently on o2. In good mood today. No new skin concerns.   Review of Systems   Constitutional: Negative for fever, chills.  Eyes: Negative for blurred vision   Respiratory: Negative for cough, shortness of breath and wheezing.    Cardiovascular: Negative for chest pain, palpitations.   Gastrointestinal: Negative for heartburn, nausea, vomiting, abdominal pain   Genitourinary: has suprapubic catheter Musculoskeletal: Negative for falls.   Skin: Negative for itching and rash.   Neurological: Negative for dizziness. Has left sided weakness post cva Psychiatric/Behavioral: Negative for depression. Has dementia  Past medical history reviewed  Medication reviewed.    Medication List       This list is accurate as of: 11/10/15  3:08 PM.  Always use your most recent med list.               acetaminophen 500 MG tablet  Commonly known as:  TYLENOL  Take 500 mg by mouth 3 (three) times daily.     albuterol (2.5 MG/3ML) 0.083% nebulizer solution  Commonly known as:  PROVENTIL  Take 2.5 mg by nebulization 3 (three) times daily.     aspirin 81 MG chewable tablet  Chew 81 mg by mouth daily.     budesonide 0.5 MG/2ML nebulizer solution  Commonly known as:  PULMICORT  Take 0.5 mg by nebulization 2 (two) times daily.     cycloSPORINE 0.05 % ophthalmic emulsion  Commonly known as:  RESTASIS  Place 1 drop into both eyes 2 (two) times daily.     donepezil 10 MG tablet    Commonly known as:  ARICEPT  Take 10 mg by mouth at bedtime.     estradiol 0.1 MG/GM vaginal cream  Commonly known as:  ESTRACE  Place 1 Applicatorful vaginally 2 (two) times a week.     FLUPHENAZINE DECANOATE IJ  Inject 25 mg/mL as directed every 30 (thirty) days. Once a month on the 19th.     fluticasone 50 MCG/ACT nasal spray  Commonly known as:  FLONASE  Place 2 sprays into the nose daily. Reported on 11/10/2015     gabapentin 300 MG capsule  Commonly known as:  NEURONTIN  Take 300 mg by mouth at bedtime.     gabapentin 100 MG capsule  Commonly known as:  NEURONTIN  Take 100 mg by mouth 2 (two) times daily. At 9 am and 2 pm     hydrALAZINE 10 MG tablet  Commonly known as:  APRESOLINE  Take 10 mg by mouth 3 (three) times daily.     HYDROcodone-acetaminophen 5-325 MG tablet  Commonly known as:  NORCO/VICODIN  Take 0.5 tablets by mouth every 6 (six) hours as needed for moderate pain.     olopatadine 0.1 % ophthalmic solution  Commonly known as:  PATANOL  Place 1 drop into both eyes 2 (two) times daily.     omeprazole 20 MG capsule  Commonly known as:  PRILOSEC  Take  20 mg by mouth daily.     OXYGEN  Inhale 2 L into the lungs.     polyethylene glycol packet  Commonly known as:  MIRALAX / GLYCOLAX  Take 17 g by mouth daily.     sennosides-docusate sodium 8.6-50 MG tablet  Commonly known as:  SENOKOT-S  Take 2 tablets by mouth 2 (two) times daily. HOLD FOR LOOSE STOOLS     sertraline 50 MG tablet  Commonly known as:  ZOLOFT  1 1/2 by mouth daily for depression     SYSTANE BALANCE OP  Apply to eye. 1 drop in both eyes twice a day     VAGISIL MAXIMUM STRENGTH 1 % Misc  Generic drug:  Pramoxine HCl  Apply 1 application topically daily.     vitamin B-12 500 MCG tablet  Commonly known as:  CYANOCOBALAMIN  Take 500 mcg by mouth daily.     vitamin C 500 MG tablet  Commonly known as:  ASCORBIC ACID  Take 500 mg by mouth daily. Supplement     Vitamin D3 2000  units Tabs  Take by mouth daily. For vitamin D deficiency        Physical exam BP 137/72 mmHg  Pulse 69  Temp(Src) 97.5 F (36.4 C) (Oral)  Resp 14  Ht 5\' 9"  (1.753 m)  Wt 213 lb 4.8 oz (96.752 kg)  BMI 31.48 kg/m2  SpO2 96%   Wt Readings from Last 3 Encounters:  11/10/15 213 lb 4.8 oz (96.752 kg)  10/13/15 210 lb (95.255 kg)  04/11/15 210 lb 11.2 oz (95.573 kg)   General- elderly obese female in no acute distress Head- atraumatic, normocephalic Eyes- PERRLA, EOMI, no pallor, no icterus, no discharge Neck- no lymphadenopathy Mouth- normal mucus membrane Cardiovascular- normal s1,s2, no murmurs, trace leg edema Respiratory- bilateral clear to auscultation, no wheeze, no rhonchi, no crackles Abdomen- bowel sounds present, soft, non tender, suprapubic catheter in place and site clean Musculoskeletal- left hemiplegia with left hand contracted, left 4th and 5th finger contracted, has a splint in place, arthritis changes in digits, hoyer transfer, diminished pedal pulses Neurological- alert and oriented, no new focal deficit Psychiatry- normal mood and affect  Labs   CBC Latest Ref Rng 10/31/2015 07/29/2015 03/27/2015  WBC - 6.5 5.9 7.5  Hemoglobin 12.0 - 16.0 g/dL 10.4(A) 11.2(A) 11.7(A)  Hematocrit 36 - 46 % 35(A) 35(A) 37  Platelets 150 - 399 K/L 168 199 201   CMP Latest Ref Rng 07/29/2015 03/27/2015 06/29/2014  Glucose 70 - 99 mg/dL - - -  BUN 4 - 21 mg/dL 12 12 13   Creatinine 0.5 - 1.1 mg/dL 0.6 0.6 0.3(A)  Sodium 137 - 147 mmol/L 143 138 139  Potassium 3.4 - 5.3 mmol/L 3.4 4.4 3.9  Chloride 96 - 112 mEq/L - - -  CO2 19 - 32 mEq/L - - -  Calcium 8.4 - 10.5 mg/dL - - -  Total Protein 6.0 - 8.3 g/dL - - -  Total Bilirubin 0.3 - 1.2 mg/dL - - -  Alkaline Phos 25 - 125 U/L - 99 83  AST 13 - 35 U/L - 77(A) 9(A)  ALT 7 - 35 U/L - - 7   Lab Results  Component Value Date   HGBA1C 7.0 07/31/2015    Assessment/plan  Urinary tract infection Has suprapubic catheter  adn has recurrent uti. Continue bactrim bid x 1 week. Maintain hydration and continue catheter care  HTN Improved reading. Continue increased dosing of hydralazine- adjusted 11/07/15. Continue  hydralazine 25 mg bid. Monitor BP  Constipation Continue senokot s, miralax and monitor  Hemiplegia post cva Fall precautions and assistance with ADLs. Needs repositioning. Continue aspirin and bp medication.   Blanchie Serve, MD  [facility] [phone] (Monday-Friday 8 am - 5 pm) [phone] (afterhours)

## 2015-12-07 ENCOUNTER — Encounter: Payer: Self-pay | Admitting: Internal Medicine

## 2015-12-07 ENCOUNTER — Non-Acute Institutional Stay (SKILLED_NURSING_FACILITY): Payer: Medicare Other | Admitting: Internal Medicine

## 2015-12-07 DIAGNOSIS — M792 Neuralgia and neuritis, unspecified: Secondary | ICD-10-CM

## 2015-12-07 DIAGNOSIS — H1013 Acute atopic conjunctivitis, bilateral: Secondary | ICD-10-CM | POA: Diagnosis not present

## 2015-12-07 DIAGNOSIS — H1010 Acute atopic conjunctivitis, unspecified eye: Secondary | ICD-10-CM | POA: Insufficient documentation

## 2015-12-07 DIAGNOSIS — J309 Allergic rhinitis, unspecified: Secondary | ICD-10-CM

## 2015-12-07 DIAGNOSIS — E1142 Type 2 diabetes mellitus with diabetic polyneuropathy: Secondary | ICD-10-CM | POA: Diagnosis not present

## 2015-12-07 NOTE — Progress Notes (Signed)
Patient ID: AMEELAH LACINA, female   DOB: 12/22/1925, 82 y.o.   MRN: [id number]     [facility]  Code Status: Full Code   Chief Complaint  Patient presents with  . Medical Management of Chronic Issues    Routine Visit   Allergies reviewed: lisinopril   HPI 82 y/o female patient is seen today for routine visit. She denies any concern this visit. No new concern from nursing staff.   Review of Systems   Constitutional: Negative for fever, chills.  Eyes: Negative for blurred vision   Respiratory: Negative for cough, shortness of breath and wheezing.    Cardiovascular: Negative for chest pain, palpitations.   Gastrointestinal: Negative for heartburn, nausea, vomiting, abdominal pain   Genitourinary: has suprapubic catheter Musculoskeletal: Negative for falls.     Past medical history reviewed  Medication reviewed.    Medication List       This list is accurate as of: 12/07/15  4:44 PM.  Always use your most recent med list.               acetaminophen 500 MG tablet  Commonly known as:  TYLENOL  Take 500 mg by mouth 3 (three) times daily.     albuterol (2.5 MG/3ML) 0.083% nebulizer solution  Commonly known as:  PROVENTIL  Take 2.5 mg by nebulization 3 (three) times daily.     aspirin 81 MG chewable tablet  Chew 81 mg by mouth daily.     budesonide 0.5 MG/2ML nebulizer solution  Commonly known as:  PULMICORT  Take 0.5 mg by nebulization 2 (two) times daily.     cycloSPORINE 0.05 % ophthalmic emulsion  Commonly known as:  RESTASIS  Place 1 drop into both eyes 2 (two) times daily.     donepezil 10 MG tablet  Commonly known as:  ARICEPT  Take 10 mg by mouth at bedtime.     estradiol 0.1 MG/GM vaginal cream  Commonly known as:  ESTRACE  Place 1 Applicatorful vaginally 2 (two) times a week.     FLUPHENAZINE DECANOATE IJ  Inject 25 mg/mL as directed every 30 (thirty) days. Once a month on the 19th.     fluticasone 50 MCG/ACT nasal spray    Commonly known as:  FLONASE  Place 2 sprays into the nose daily. Reported on 11/10/2015     gabapentin 300 MG capsule  Commonly known as:  NEURONTIN  Take 300 mg by mouth at bedtime.     hydrALAZINE 25 MG tablet  Commonly known as:  APRESOLINE  Take 25 mg by mouth 2 (two) times daily.     HYDROcodone-acetaminophen 5-325 MG tablet  Commonly known as:  NORCO/VICODIN  Take 0.5 tablets by mouth every 6 (six) hours as needed for moderate pain.     olopatadine 0.1 % ophthalmic solution  Commonly known as:  PATANOL  Place 1 drop into both eyes 2 (two) times daily.     omeprazole 20 MG capsule  Commonly known as:  PRILOSEC  Take 20 mg by mouth daily.     OXYGEN  Inhale 2 L into the lungs.     polyethylene glycol packet  Commonly known as:  MIRALAX / GLYCOLAX  Take 17 g by mouth daily.     sennosides-docusate sodium 8.6-50 MG tablet  Commonly known as:  SENOKOT-S  Take 2 tablets by mouth 2 (two) times daily. HOLD FOR LOOSE STOOLS     sertraline 50 MG tablet  Commonly known as:  ZOLOFT  1 1/2 by mouth daily for depression     SYSTANE BALANCE OP  Apply to eye. 1 drop in both eyes twice a day     UNABLE TO FIND  Med Name: white vinegar irrigate suprapubic tube every other day with 1/2 sterile water & 1/2 vinegar in the morning (100 cc of each = 200 cc)     vitamin B-12 500 MCG tablet  Commonly known as:  CYANOCOBALAMIN  Take 500 mcg by mouth daily.     vitamin C 500 MG tablet  Commonly known as:  ASCORBIC ACID  Take 500 mg by mouth daily. Supplement     Vitamin D3 2000 units Tabs  Take by mouth daily. For vitamin D deficiency        Physical exam BP 115/71 mmHg  Pulse 72  Temp(Src) 97.8 F (36.6 C) (Oral)  Resp 18  Ht 5\' 9"  (1.753 m)  Wt 211 lb 9.6 oz (95.981 kg)  BMI 31.23 kg/m2  SpO2 97%   Wt Readings from Last 3 Encounters:  12/07/15 211 lb 9.6 oz (95.981 kg)  11/10/15 213 lb 4.8 oz (96.752 kg)  10/13/15 210 lb (95.255 kg)   General- elderly obese  female in no acute distress Head- atraumatic, normocephalic Eyes- PERRLA, EOMI, no pallor, no icterus, no discharge Neck- no lymphadenopathy Mouth- normal mucus membrane Cardiovascular- normal s1,s2, no murmurs, trace leg edema Respiratory- bilateral clear to auscultation, no wheeze, no rhonchi, no crackles Abdomen- bowel sounds present, soft, non tender, suprapubic catheter in place and site clean Musculoskeletal- left hemiplegia with left hand contracted, left 4th and 5th finger contracted, has a splint in place, arthritis changes in digits, hoyer transfer, diminished pedal pulses Neurological- alert and oriented, no new focal deficit Psychiatry- normal mood and affect  Labs   CBC Latest Ref Rng 10/31/2015 07/29/2015 03/27/2015  WBC - 6.5 5.9 7.5  Hemoglobin 12.0 - 16.0 g/dL 10.4(A) 11.2(A) 11.7(A)  Hematocrit 36 - 46 % 35(A) 35(A) 37  Platelets 150 - 399 K/L 168 199 201   CMP Latest Ref Rng 07/29/2015 03/27/2015 06/29/2014  Glucose 70 - 99 mg/dL - - -  BUN 4 - 21 mg/dL 12 12 13   Creatinine 0.5 - 1.1 mg/dL 0.6 0.6 0.3(A)  Sodium 137 - 147 mmol/L 143 138 139  Potassium 3.4 - 5.3 mmol/L 3.4 4.4 3.9  Chloride 96 - 112 mEq/L - - -  CO2 19 - 32 mEq/L - - -  Calcium 8.4 - 10.5 mg/dL - - -  Total Protein 6.0 - 8.3 g/dL - - -  Total Bilirubin 0.3 - 1.2 mg/dL - - -  Alkaline Phos 25 - 125 U/L - 99 83  AST 13 - 35 U/L - 77(A) 9(A)  ALT 7 - 35 U/L - - 7   Lab Results  Component Value Date   HGBA1C 7.0 07/31/2015    Assessment/plan  DM with neuropathy a1c from November reviewed. Check a1c and urine microalbumin. Currently off all diabetic medication a1c goal < 7. Continue gabapentin  Neuropathic pain Continue gabapentin 300 mg qhs  Allergic rhinitis and conjunctivitis Continue flonase and pataday. Stable at present    [facility], MD  [place] [phone] (Monday-Friday 8 am - 5 pm) [phone] (afterhours)

## 2015-12-25 LAB — TSH: TSH: 0.94 u[IU]/mL (ref 0.41–5.90)

## 2015-12-25 LAB — HEPATIC FUNCTION PANEL
ALT: 9 U/L (ref 7–35)
AST: 12 U/L — AB (ref 13–35)
Alkaline Phosphatase: 96 U/L (ref 25–125)
Bilirubin, Total: 0.2 mg/dL

## 2015-12-25 LAB — CBC AND DIFFERENTIAL
HEMATOCRIT: 38 % (ref 36–46)
HEMOGLOBIN: 11 g/dL — AB (ref 12.0–16.0)
Platelets: 183 10*3/uL (ref 150–399)
WBC: 9.4 10*3/mL

## 2015-12-25 LAB — BASIC METABOLIC PANEL
BUN: 11 mg/dL (ref 4–21)
Creatinine: 0.7 mg/dL (ref 0.5–1.1)
Glucose: 140 mg/dL
POTASSIUM: 3.4 mmol/L (ref 3.4–5.3)
Sodium: 147 mmol/L (ref 137–147)

## 2015-12-25 LAB — HEMOGLOBIN A1C: Hemoglobin A1C: 5.6

## 2015-12-25 LAB — LIPID PANEL
CHOLESTEROL: 251 mg/dL — AB (ref 0–200)
HDL: 36 mg/dL (ref 35–70)
LDL Cholesterol: 176 mg/dL
Triglycerides: 193 mg/dL — AB (ref 40–160)

## 2016-01-10 ENCOUNTER — Non-Acute Institutional Stay (SKILLED_NURSING_FACILITY): Payer: Medicare Other | Admitting: Internal Medicine

## 2016-01-10 ENCOUNTER — Encounter: Payer: Self-pay | Admitting: Internal Medicine

## 2016-01-10 DIAGNOSIS — E1140 Type 2 diabetes mellitus with diabetic neuropathy, unspecified: Secondary | ICD-10-CM | POA: Insufficient documentation

## 2016-01-10 DIAGNOSIS — J441 Chronic obstructive pulmonary disease with (acute) exacerbation: Secondary | ICD-10-CM

## 2016-01-10 DIAGNOSIS — I10 Essential (primary) hypertension: Secondary | ICD-10-CM | POA: Diagnosis not present

## 2016-01-10 DIAGNOSIS — E538 Deficiency of other specified B group vitamins: Secondary | ICD-10-CM

## 2016-01-10 DIAGNOSIS — J9611 Chronic respiratory failure with hypoxia: Secondary | ICD-10-CM | POA: Diagnosis not present

## 2016-01-10 DIAGNOSIS — J309 Allergic rhinitis, unspecified: Secondary | ICD-10-CM | POA: Insufficient documentation

## 2016-01-10 NOTE — Progress Notes (Signed)
Patient ID: ACE HOCUTT, female   DOB: [birth year], 82 y.o.   MRN: [id number]     [facility]  Code Status: Full Code   Chief Complaint  Patient presents with  . Medical Management of Chronic Issues    Routine Visit   Allergies reviewed: lisinopril   HPI 82 y/o female patient is seen today for routine visit. She denies any concern this visit. No new concern from nursing staff. No fall reported. No new skin concern. Her suprapubic catheter is draining well. No behavioral concerns. BP reading stable. She was treated with a brief course of prednisone for possible copd exacerbation and started on claritin for allergies. She was started on norvasc for BP and readings are stable.    Review of Systems  : limited Constitutional: Negative for fever, chills.  Eyes: Negative for blurred vision   Respiratory: Negative for cough, shortness of breath and wheezing.    Cardiovascular: Negative for chest pain, palpitations.   Gastrointestinal: Negative for heartburn, nausea, vomiting, abdominal pain   Genitourinary: has suprapubic catheter   Past medical history reviewed  Medication reviewed.    Medication List       This list is accurate as of: 01/10/16  3:12 PM.  Always use your most recent med list.               acetaminophen 500 MG tablet  Commonly known as:  TYLENOL  Take 500 mg by mouth 3 (three) times daily.     albuterol (2.5 MG/3ML) 0.083% nebulizer solution  Commonly known as:  PROVENTIL  Take 2.5 mg by nebulization 3 (three) times daily.     amLODipine 5 MG tablet  Commonly known as:  NORVASC  Take 5 mg by mouth daily.     aspirin 81 MG chewable tablet  Chew 81 mg by mouth daily.     budesonide 0.5 MG/2ML nebulizer solution  Commonly known as:  PULMICORT  Take 0.5 mg by nebulization 2 (two) times daily.     cycloSPORINE 0.05 % ophthalmic emulsion  Commonly known as:  RESTASIS  Place 1 drop into both eyes 2 (two) times daily.     donepezil 10  MG tablet  Commonly known as:  ARICEPT  Take 10 mg by mouth at bedtime.     estradiol 0.1 MG/GM vaginal cream  Commonly known as:  ESTRACE  Place 1 Applicatorful vaginally 2 (two) times a week.     FLUPHENAZINE DECANOATE IJ  Inject 25 mg/mL as directed every 30 (thirty) days. Once a month on the 19th.     fluticasone 50 MCG/ACT nasal spray  Commonly known as:  FLONASE  Place 2 sprays into the nose daily. Reported on 11/10/2015     gabapentin 300 MG capsule  Commonly known as:  NEURONTIN  Take 300 mg by mouth at bedtime.     gabapentin 100 MG capsule  Commonly known as:  NEURONTIN  Take 100 mg by mouth 2 (two) times daily.     hydrALAZINE 25 MG tablet  Commonly known as:  APRESOLINE  Take 25 mg by mouth 2 (two) times daily.     HYDROcodone-acetaminophen 5-325 MG tablet  Commonly known as:  NORCO/VICODIN  Take 0.5 tablets by mouth every 6 (six) hours as needed for moderate pain.     loratadine 10 MG tablet  Commonly known as:  CLARITIN  Take 10 mg by mouth daily.     olopatadine 0.1 % ophthalmic solution  Commonly known  as:  PATANOL  Place 1 drop into both eyes 2 (two) times daily.     omeprazole 20 MG capsule  Commonly known as:  PRILOSEC  Take 20 mg by mouth daily.     OXYGEN  Inhale 2 L into the lungs.     polyethylene glycol packet  Commonly known as:  MIRALAX / GLYCOLAX  Take 17 g by mouth daily.     sennosides-docusate sodium 8.6-50 MG tablet  Commonly known as:  SENOKOT-S  Take 2 tablets by mouth 2 (two) times daily. HOLD FOR LOOSE STOOLS     sertraline 50 MG tablet  Commonly known as:  ZOLOFT  1 1/2 by mouth daily for depression     SYSTANE BALANCE OP  Apply to eye. 1 drop in both eyes twice a day     UNABLE TO FIND  Med Name: white vinegar irrigate suprapubic tube every other day with 1/2 sterile water & 1/2 vinegar in the morning (100 cc of each = 200 cc)     vitamin B-12 500 MCG tablet  Commonly known as:  CYANOCOBALAMIN  Take 500 mcg by mouth  daily.     vitamin C 500 MG tablet  Commonly known as:  ASCORBIC ACID  Take 500 mg by mouth daily. Supplement     Vitamin D3 2000 units Tabs  Take by mouth daily. For vitamin D deficiency        Physical exam BP 145/75 mmHg  Pulse 62  Temp(Src) 97.1 F (36.2 C) (Oral)  Resp 20  Ht 5\' 9"  (1.753 m)  Wt 211 lb 12.8 oz (96.072 kg)  BMI 31.26 kg/m2   Wt Readings from Last 3 Encounters:  01/10/16 211 lb 12.8 oz (96.072 kg)  12/07/15 211 lb 9.6 oz (95.981 kg)  11/10/15 213 lb 4.8 oz (96.752 kg)   General- elderly obese female in no acute distress Head- atraumatic, normocephalic Eyes- no pallor, no icterus, no discharge Cardiovascular- normal s1,s2, no murmurs, trace leg edema Respiratory- bilateral clear to auscultation, no wheeze, no rhonchi, no crackles Abdomen- bowel sounds present, soft, non tender, suprapubic catheter in place and site clean Musculoskeletal- left hemiplegia with left hand contracted, left 4th and 5th finger contracted, has a splint in place, arthritis changes in digits, hoyer transfer, diminished pedal pulses Neurological- alert and oriented Psychiatry- normal mood and affect  Labs   CBC Latest Ref Rng 12/25/2015 10/31/2015 07/29/2015  WBC - 9.4 6.5 5.9  Hemoglobin 12.0 - 16.0 g/dL 11.0(A) 10.4(A) 11.2(A)  Hematocrit 36 - 46 % 38 35(A) 35(A)  Platelets 150 - 399 K/L 183 168 199   CMP Latest Ref Rng 12/25/2015 07/29/2015 03/27/2015  Glucose 70 - 99 mg/dL - - -  BUN 4 - 21 mg/dL 11 12 12   Creatinine 0.5 - 1.1 mg/dL 0.7 0.6 0.6  Sodium 137 - 147 mmol/L 147 143 138  Potassium 3.4 - 5.3 mmol/L 3.4 3.4 4.4  Chloride 96 - 112 mEq/L - - -  CO2 19 - 32 mEq/L - - -  Calcium 8.4 - 10.5 mg/dL - - -  Total Protein 6.0 - 8.3 g/dL - - -  Total Bilirubin 0.3 - 1.2 mg/dL - - -  Alkaline Phos 25 - 125 U/L 96 - 99  AST 13 - 35 U/L 12(A) - 77(A)  ALT 7 - 35 U/L 9 - -   Lab Results  Component Value Date   HGBA1C 5.6 12/25/2015    Assessment/plan  HTN Stable  bp reading. Continue  norvasc 5 mg daily, hydralazine 25 mg bid and monitor BP  Allergic rhinitis Continue claritin with flonase and pataday eye drops  Copd exacerbation Improved breathing, completed her prednisone course. Continue budosenide and prn albuterol  Chronic respiratory failure Continue o2 and bronchodilator treatment  b12 def Continue b12 supplement  DM with neuropathy a1c reviewed. Currently off all diabetic medication a1c goal < 7. Continue gabapentin    Blanchie Serve, MD  [facility] [phone] (Monday-Friday 8 am - 5 pm) [phone] (afterhours)

## 2016-01-20 ENCOUNTER — Inpatient Hospital Stay (HOSPITAL_COMMUNITY)
Admission: EM | Admit: 2016-01-20 | Discharge: 2016-01-23 | DRG: 690 | Disposition: A | Discharge status: Skilled Nursing Facility | Payer: Medicare Other | Attending: Internal Medicine | Admitting: Internal Medicine

## 2016-01-20 ENCOUNTER — Encounter (HOSPITAL_COMMUNITY): Payer: Self-pay | Admitting: *Deleted

## 2016-01-20 ENCOUNTER — Emergency Department (HOSPITAL_COMMUNITY): Payer: Medicare Other

## 2016-01-20 DIAGNOSIS — Z7401 Bed confinement status: Secondary | ICD-10-CM

## 2016-01-20 DIAGNOSIS — E119 Type 2 diabetes mellitus without complications: Secondary | ICD-10-CM

## 2016-01-20 DIAGNOSIS — R197 Diarrhea, unspecified: Secondary | ICD-10-CM | POA: Diagnosis present

## 2016-01-20 DIAGNOSIS — Z9981 Dependence on supplemental oxygen: Secondary | ICD-10-CM

## 2016-01-20 DIAGNOSIS — I509 Heart failure, unspecified: Secondary | ICD-10-CM

## 2016-01-20 DIAGNOSIS — J9611 Chronic respiratory failure with hypoxia: Secondary | ICD-10-CM | POA: Diagnosis not present

## 2016-01-20 DIAGNOSIS — Z7982 Long term (current) use of aspirin: Secondary | ICD-10-CM

## 2016-01-20 DIAGNOSIS — Z888 Allergy status to other drugs, medicaments and biological substances status: Secondary | ICD-10-CM

## 2016-01-20 DIAGNOSIS — Z853 Personal history of malignant neoplasm of breast: Secondary | ICD-10-CM

## 2016-01-20 DIAGNOSIS — N3091 Cystitis, unspecified with hematuria: Principal | ICD-10-CM | POA: Diagnosis present

## 2016-01-20 DIAGNOSIS — Z8249 Family history of ischemic heart disease and other diseases of the circulatory system: Secondary | ICD-10-CM

## 2016-01-20 DIAGNOSIS — E785 Hyperlipidemia, unspecified: Secondary | ICD-10-CM | POA: Diagnosis present

## 2016-01-20 DIAGNOSIS — K922 Gastrointestinal hemorrhage, unspecified: Secondary | ICD-10-CM

## 2016-01-20 DIAGNOSIS — K219 Gastro-esophageal reflux disease without esophagitis: Secondary | ICD-10-CM | POA: Diagnosis present

## 2016-01-20 DIAGNOSIS — R339 Retention of urine, unspecified: Secondary | ICD-10-CM | POA: Diagnosis present

## 2016-01-20 DIAGNOSIS — F329 Major depressive disorder, single episode, unspecified: Secondary | ICD-10-CM | POA: Diagnosis present

## 2016-01-20 DIAGNOSIS — N189 Chronic kidney disease, unspecified: Secondary | ICD-10-CM | POA: Diagnosis present

## 2016-01-20 DIAGNOSIS — J449 Chronic obstructive pulmonary disease, unspecified: Secondary | ICD-10-CM | POA: Diagnosis not present

## 2016-01-20 DIAGNOSIS — E538 Deficiency of other specified B group vitamins: Secondary | ICD-10-CM | POA: Diagnosis present

## 2016-01-20 DIAGNOSIS — I1310 Hypertensive heart and chronic kidney disease without heart failure, with stage 1 through stage 4 chronic kidney disease, or unspecified chronic kidney disease: Secondary | ICD-10-CM | POA: Diagnosis present

## 2016-01-20 DIAGNOSIS — Z807 Family history of other malignant neoplasms of lymphoid, hematopoietic and related tissues: Secondary | ICD-10-CM

## 2016-01-20 DIAGNOSIS — R319 Hematuria, unspecified: Secondary | ICD-10-CM | POA: Diagnosis not present

## 2016-01-20 DIAGNOSIS — H409 Unspecified glaucoma: Secondary | ICD-10-CM | POA: Diagnosis present

## 2016-01-20 DIAGNOSIS — Z7951 Long term (current) use of inhaled steroids: Secondary | ICD-10-CM

## 2016-01-20 DIAGNOSIS — I10 Essential (primary) hypertension: Secondary | ICD-10-CM | POA: Diagnosis present

## 2016-01-20 DIAGNOSIS — Z79899 Other long term (current) drug therapy: Secondary | ICD-10-CM

## 2016-01-20 DIAGNOSIS — E1122 Type 2 diabetes mellitus with diabetic chronic kidney disease: Secondary | ICD-10-CM | POA: Diagnosis present

## 2016-01-20 DIAGNOSIS — F209 Schizophrenia, unspecified: Secondary | ICD-10-CM | POA: Diagnosis not present

## 2016-01-20 DIAGNOSIS — E876 Hypokalemia: Secondary | ICD-10-CM | POA: Diagnosis present

## 2016-01-20 DIAGNOSIS — F0150 Vascular dementia without behavioral disturbance: Secondary | ICD-10-CM | POA: Diagnosis present

## 2016-01-20 DIAGNOSIS — I69354 Hemiplegia and hemiparesis following cerebral infarction affecting left non-dominant side: Secondary | ICD-10-CM

## 2016-01-20 DIAGNOSIS — E1140 Type 2 diabetes mellitus with diabetic neuropathy, unspecified: Secondary | ICD-10-CM | POA: Diagnosis present

## 2016-01-20 DIAGNOSIS — N3090 Cystitis, unspecified without hematuria: Secondary | ICD-10-CM

## 2016-01-20 DIAGNOSIS — Z993 Dependence on wheelchair: Secondary | ICD-10-CM

## 2016-01-20 DIAGNOSIS — E1121 Type 2 diabetes mellitus with diabetic nephropathy: Secondary | ICD-10-CM | POA: Diagnosis present

## 2016-01-20 DIAGNOSIS — M810 Age-related osteoporosis without current pathological fracture: Secondary | ICD-10-CM | POA: Diagnosis present

## 2016-01-20 DIAGNOSIS — Z87891 Personal history of nicotine dependence: Secondary | ICD-10-CM

## 2016-01-20 LAB — COMPREHENSIVE METABOLIC PANEL
ALT: 12 U/L — ABNORMAL LOW (ref 14–54)
ANION GAP: 9 (ref 5–15)
AST: 21 U/L (ref 15–41)
Albumin: 3.5 g/dL (ref 3.5–5.0)
Alkaline Phosphatase: 85 U/L (ref 38–126)
BUN: 10 mg/dL (ref 6–20)
CO2: 26 mmol/L (ref 22–32)
Calcium: 9.9 mg/dL (ref 8.9–10.3)
Chloride: 107 mmol/L (ref 101–111)
Creatinine, Ser: 0.61 mg/dL (ref 0.44–1.00)
GFR calc Af Amer: >60 mL/min (ref >60)
Glucose, Bld: 125 mg/dL — ABNORMAL HIGH (ref 65–99)
POTASSIUM: 3.3 mmol/L — AB (ref 3.5–5.1)
Sodium: 142 mmol/L (ref 135–145)
TOTAL PROTEIN: 6.8 g/dL (ref 6.5–8.1)
Total Bilirubin: 0.4 mg/dL (ref 0.3–1.2)

## 2016-01-20 LAB — NO BLOOD PRODUCTS

## 2016-01-20 LAB — CBC WITH DIFFERENTIAL/PLATELET
Basophils Absolute: 0 10*3/uL (ref 0.0–0.1)
Basophils Relative: 0 %
Eosinophils Absolute: 0.2 10*3/uL (ref 0.0–0.7)
Eosinophils Relative: 3 %
HEMATOCRIT: 35.3 % — AB (ref 36.0–46.0)
Hemoglobin: 11.2 g/dL — ABNORMAL LOW (ref 12.0–15.0)
LYMPHS PCT: 33 %
Lymphs Abs: 2.2 10*3/uL (ref 0.7–4.0)
MCH: 26.5 pg (ref 26.0–34.0)
MCHC: 31.7 g/dL (ref 30.0–36.0)
MCV: 83.5 fL (ref 78.0–100.0)
MONO ABS: 0.4 10*3/uL (ref 0.1–1.0)
MONOS PCT: 6 %
NEUTROS ABS: 3.7 10*3/uL (ref 1.7–7.7)
Neutrophils Relative %: 58 %
Platelets: 162 10*3/uL (ref 150–400)
RBC: 4.23 MIL/uL (ref 3.87–5.11)
RDW: 16.8 % — AB (ref 11.5–15.5)
WBC: 6.5 10*3/uL (ref 4.0–10.5)

## 2016-01-20 LAB — URINALYSIS, ROUTINE W REFLEX MICROSCOPIC
BILIRUBIN URINE: NEGATIVE
Glucose, UA: NEGATIVE mg/dL
Ketones, ur: NEGATIVE mg/dL
NITRITE: POSITIVE — AB
PH: 7 (ref 5.0–8.0)
Protein, ur: >300 mg/dL — AB
SPECIFIC GRAVITY, URINE: 1.021 (ref 1.005–1.030)

## 2016-01-20 LAB — PROTIME-INR
INR: 1.09 (ref 0.00–1.49)
PROTHROMBIN TIME: 13.8 s (ref 11.6–15.2)

## 2016-01-20 LAB — URINE MICROSCOPIC-ADD ON

## 2016-01-20 LAB — APTT: aPTT: 36 seconds (ref 24–37)

## 2016-01-20 LAB — POC OCCULT BLOOD, ED: Fecal Occult Bld: NEGATIVE

## 2016-01-20 MED ORDER — CEFTRIAXONE SODIUM 1 G IJ SOLR
1.0000 g | INTRAMUSCULAR | Status: DC
  Administered 2016-01-20 – 2016-01-22 (×3): 1 g via INTRAVENOUS
  Filled 2016-01-20 (×3): qty 10

## 2016-01-20 MED ORDER — DIATRIZOATE MEGLUMINE & SODIUM 66-10 % PO SOLN
30.0000 mL | Freq: Once | ORAL | Status: AC
  Administered 2016-01-20: 30 mL via ORAL

## 2016-01-20 MED ORDER — IOPAMIDOL (ISOVUE-300) INJECTION 61%
100.0000 mL | Freq: Once | INTRAVENOUS | Status: AC | PRN
  Administered 2016-01-20: 100 mL via INTRAVENOUS

## 2016-01-20 MED ORDER — SODIUM CHLORIDE 0.9 % IV SOLN
INTRAVENOUS | Status: DC
  Administered 2016-01-20 – 2016-01-21 (×2): via INTRAVENOUS
  Administered 2016-01-21: 20 mL/h via INTRAVENOUS

## 2016-01-20 MED ORDER — POTASSIUM CHLORIDE CRYS ER 20 MEQ PO TBCR
20.0000 meq | EXTENDED_RELEASE_TABLET | Freq: Once | ORAL | Status: AC
  Administered 2016-01-20: 20 meq via ORAL
  Filled 2016-01-20: qty 1

## 2016-01-20 MED ORDER — CEPHALEXIN 500 MG PO CAPS: 500.0000 mg | ORAL_CAPSULE | Freq: Four times a day (QID) | ORAL | Status: DC

## 2016-01-20 NOTE — ED Notes (Signed)
Pt and family at bedside. Pt signed refusal of blood products due to practicing Jehov[employer]'s [employer]. Pt understands risks of refusal but will accept Albumin and Albumin containing products.

## 2016-01-20 NOTE — ED Notes (Signed)
Bed: WA20 Expected date:  Expected time:  Means of arrival:  Comments: EMS- hematuria

## 2016-01-20 NOTE — ED Notes (Signed)
Pt had formed stool with saturated pad of blood bright red in color along with blood clots. Dr.Allen notified and he came to bedside to evaluate pt further. Pt cleaned after evaluation by Dr.Allen and new pad applied.

## 2016-01-20 NOTE — ED Notes (Signed)
Hospitalist at bedside to evaluate pt. Family at bedside

## 2016-01-20 NOTE — ED Notes (Signed)
Per EMS report: Pt coming from [facility] and presents to the ED with dark red urine. Pt noticed the urine about 2.5 hours prior to arrival to ED.  Pt also has bright red vaginal bleeding.  Pt abd is distended and rigid.  Pt discoloration noted by EMS.  Denies any trauma to abd. Pt reports lower right sided flank pain.  Hx dementia but is oriented to herself, place, and situation. Hx of stroke with left sided deficits.

## 2016-01-20 NOTE — Discharge Instructions (Signed)

## 2016-01-20 NOTE — ED Provider Notes (Addendum)
CSN: [id number]     Arrival date & time 01/20/16  1746 History   First MD Initiated Contact with Patient 01/20/16 1834     Chief Complaint  Patient presents with  . Hematuria     (Consider location/radiation/quality/duration/timing/severity/associated sxs/prior Treatment) HPI Comments: Patient here with 24 hours of dark red urine as well as vaginal bleeding. Has a history of a superpubic catheter. No prior history of hysterectomy. Serious she has had bright red vaginal bleeding without rectal bleeding. No history of abdominal pain but has had some abdominal distention. No emesis reported. Did have some dark cloudy urine yesterday and her catheter. Has a baseline history of dementia but is oriented to herself place and situation. Family states that she's never anything like this before in the past. No treatment use prior to arrival  Patient is a 82 y.o. female presenting with hematuria. The history is provided by the patient and a relative.  Hematuria    Past Medical History  Diagnosis Date  . Anemia   . Arthritis   . COPD (chronic obstructive pulmonary disease) ([place])   . Chronic schizophrenia ([place])   . Dementia   . Depressive disorder   . Acute sinusitis   . Schizophrenia ([place])   . Osteoporosis   . Stenosis of lumbosacral spine   . Glaucoma   . On home oxygen therapy     2 L PER NASAL CANNULA  . GERD (gastroesophageal reflux disease)   . Stroke ([facility])     L hemiparesis  . Cervical spondylosis   . DM (diabetes mellitus) ([place])     DIET CONTROLLED  . Chronic kidney disease     chronic kidney disease per MD notes  . CHF (congestive heart failure) ([facility])     chronic and stable per MD note  . Hyperlipidemia   . Hypertension   . Foley catheter in place   . Wheelchair dependent   . Breast cancer [facility])    Past Surgical History  Procedure Laterality Date  . Appendectomy    . Cataract extraction, bilateral    . Cholecystectomy    . Uterine suspension    . Joint replacement       L TOTAL KNEE  . Breast surgery      breast cancer  . Insertion of suprapubic catheter N/A 04/29/2014    Procedure: INSERTION OF SUPRAPUBIC CATHETER;  Surgeon: Arvil Persons, MD;  Location: [facility] ORS;  Service: Urology;  Laterality: N/A;   Family History  Problem Relation Age of Onset  . Asthma Daughter   . Heart disease Sister     3 sisters  . Heart disease Brother     x 2  . Cancer Brother     multiple myloma   Social History  Substance Use Topics  . Smoking status: Former Smoker -- 1.00 packs/day for 15 years    Types: Cigarettes    Quit date: 09/16/1997  . Smokeless tobacco: None  . Alcohol Use: No   OB History    No data available     Review of Systems  Genitourinary: Positive for hematuria.  All other systems reviewed and are negative.     Allergies  Lisinopril  Home Medications   Prior to Admission medications   Medication Sig Start Date End Date Taking? Authorizing Provider  acetaminophen (TYLENOL) 500 MG tablet Take 500 mg by mouth 3 (three) times daily.    Yes Historical Provider, MD  albuterol (PROVENTIL) (2.5 MG/3ML) 0.083% nebulizer solution Take 2.5  mg by nebulization 3 (three) times daily.   Yes Historical Provider, MD  amLODipine (NORVASC) 5 MG tablet Take 5 mg by mouth daily.   Yes Historical Provider, MD  aspirin 81 MG chewable tablet Chew 81 mg by mouth daily.   Yes Historical Provider, MD  budesonide (PULMICORT) 0.5 MG/2ML nebulizer solution Take 0.5 mg by nebulization 2 (two) times daily.   Yes Historical Provider, MD  Cholecalciferol (VITAMIN D3) 2000 UNITS TABS Take by mouth daily. For vitamin D deficiency   Yes Historical Provider, MD  cycloSPORINE (RESTASIS) 0.05 % ophthalmic emulsion Place 1 drop into both eyes 2 (two) times daily.    Yes Historical Provider, MD  donepezil (ARICEPT) 10 MG tablet Take 10 mg by mouth at bedtime.    Yes Historical Provider, MD  estradiol (ESTRACE) 0.1 MG/GM vaginal cream Place 1 Applicatorful vaginally 2 (two)  times a week.   Yes Historical Provider, MD  FLUPHENAZINE DECANOATE IJ Inject 25 mg/mL as directed every 30 (thirty) days. Once a month on the 19th.   Yes Historical Provider, MD  fluticasone (FLONASE) 50 MCG/ACT nasal spray Place 2 sprays into the nose daily. Reported on 11/10/2015   Yes Historical Provider, MD  gabapentin (NEURONTIN) 100 MG capsule Take 100 mg by mouth 2 (two) times daily.   Yes Historical Provider, MD  gabapentin (NEURONTIN) 300 MG capsule Take 300 mg by mouth at bedtime.   Yes Historical Provider, MD  hydrALAZINE (APRESOLINE) 25 MG tablet Take 25 mg by mouth 2 (two) times daily.   Yes Historical Provider, MD  HYDROcodone-acetaminophen (NORCO/VICODIN) 5-325 MG per tablet Take 0.5 tablets by mouth every 6 (six) hours as needed for moderate pain. 12/22/14  Yes Lauree Chandler, NP  loratadine (CLARITIN) 10 MG tablet Take 10 mg by mouth daily.   Yes Historical Provider, MD  olopatadine (PATANOL) 0.1 % ophthalmic solution Place 1 drop into both eyes 2 (two) times daily.    Yes Historical Provider, MD  omeprazole (PRILOSEC) 20 MG capsule Take 20 mg by mouth daily.   Yes Historical Provider, MD  OXYGEN Inhale 2 L into the lungs.   Yes Historical Provider, MD  polyethylene glycol (MIRALAX / GLYCOLAX) packet Take 17 g by mouth daily.   Yes Historical Provider, MD  Propylene Glycol (SYSTANE BALANCE OP) Apply to eye. 1 drop in both eyes twice a day   Yes Historical Provider, MD  sennosides-docusate sodium (SENOKOT-S) 8.6-50 MG tablet Take 2 tablets by mouth 2 (two) times daily. HOLD FOR LOOSE STOOLS   Yes Historical Provider, MD  sertraline (ZOLOFT) 50 MG tablet 1 1/2 by mouth daily for depression   Yes Historical Provider, MD  [place] Name: white vinegar irrigate suprapubic tube every other day with 1/2 sterile water & 1/2 vinegar in the morning (100 cc of each = 200 cc)   Yes Historical Provider, MD  vitamin B-12 (CYANOCOBALAMIN) 500 MCG tablet Take 500 mcg by mouth daily.   Yes  Historical Provider, MD  vitamin C (ASCORBIC ACID) 500 MG tablet Take 500 mg by mouth daily. Supplement   Yes Historical Provider, MD   BP 147/80 mmHg  Pulse 66  Temp(Src) 98.4 F (36.9 C) (Oral)  Resp 18  SpO2 100% Physical Exam  Constitutional: She is oriented to person, place, and time. She appears well-developed and well-nourished.  Non-toxic appearance. No distress.  HENT:  Head: Normocephalic and atraumatic.  Eyes: Conjunctivae, EOM and lids are normal. Pupils are equal, round, and reactive to  light.  Neck: Normal range of motion. Neck supple. No tracheal deviation present. No thyroid mass present.  Cardiovascular: Normal rate, regular rhythm and normal heart sounds.  Exam reveals no gallop.   No murmur heard. Pulmonary/Chest: Effort normal and breath sounds normal. No stridor. No respiratory distress. She has no decreased breath sounds. She has no wheezes. She has no rhonchi. She has no rales.  Abdominal: Soft. Normal appearance and bowel sounds are normal. She exhibits no distension. There is no tenderness. There is no rebound and no CVA tenderness.  Genitourinary: There is no rash on the right labia. There is no rash on the left labia.  Stool in rectal vault.  Patient is digital exam was negative for blood. She does have some blood coming from the urethra.  Musculoskeletal: Normal range of motion. She exhibits no edema or tenderness.  Neurological: She is alert and oriented to person, place, and time. She displays atrophy. No cranial nerve deficit. GCS eye subscore is 4. GCS verbal subscore is 5. GCS motor subscore is 6.  Skin: Skin is warm and dry. No abrasion and no rash noted.  Psychiatric: Her affect is blunt. Her speech is delayed. She is slowed.  Nursing note and vitals reviewed.   ED Course  Procedures (including critical care time) Labs Review Labs Reviewed  CBC WITH DIFFERENTIAL/PLATELET - Abnormal; Notable for the following:    Hemoglobin 11.2 (*)    HCT 35.3  (*)    RDW 16.8 (*)    All other components within normal limits  URINE CULTURE  COMPREHENSIVE METABOLIC PANEL  PROTIME-INR  APTT  URINALYSIS, ROUTINE W REFLEX MICROSCOPIC (NOT AT [facility])  TYPE AND SCREEN    Imaging Review No results found. I have personally reviewed and evaluated these images and lab results as part of my medical decision-making.   EKG Interpretation None      MDM   Final diagnoses:  None    Patient source of bleeding likely from her urethra. Urinalysis shows that she does have an infection. Patient likely has hemorrhagic cystitis. Will give patient notes of IV Rocephin here and placed on Keflex and urine culture to be sent.  10:42 PM Patient was to be discharged and then she had a bowel movement that had stool as well as blood mixed in it. Spoke with hospitalist and she will be admitted for further management  Lacretia Leigh, MD 01/20/16 2135  Lacretia Leigh, MD 01/20/16 2243

## 2016-01-20 NOTE — ED Notes (Signed)
Patient transported to CT 

## 2016-01-20 NOTE — ED Notes (Signed)
[facility] notified for need of transport of pt back to facility.

## 2016-01-20 NOTE — H&P (Signed)
[name] [id number] DOB: 02-15-1926 DOA: 01/20/2016   Referring MD Zenia Resides PCP: Wenda Low, MD   Outpatient Specialists:  Pulmonology Elsworth Soho , Urology Nesi now sees B.Budzyn Patient coming from: Nursing home [facility]  Chief Complaint: Dark red urine  HPI: [name] is a 82 y.o. female bed bound residing at SNF with medical history significant of vascular dementia, chronic kidney disease, hypertension,  diabetes with neuropathy, COPD chronically on oxygen,  status post suprapubic catheter, history of CVA with residual left-sided weakness bed bound    Presented with onset of dark urine today although even prior to this she have had somewhat darkly urine associated with possible red vaginal bleeding vs rectal and abdominal distention. Patient had been having right flank pain. Family was concerned and patient was brought to emerge department this was not associated nausea vomiting no abdominal pain but she did have abdominal distention. In 2004 she have had upper Gi bleed family unsure who was her Gi doctor at the time she never followed.  She denies any fever or chills no cough no nausea no vomiting is associated diarrhea for the past 1 week of note her roommate has also had diarrhea   Regarding pertinent Chronic problems: Patient has history of chronic respiratory failure secondary to COPD on chronic oxygen 2 L nasal cannula. Regarding her diabetes is complicated by neuropathy treated with Neurontin as well as chronic kidney disease last hemoglobin A1c was 5.6 on 12/25/2015 Patient had history of recurrent urinary tract infection which family felt to be secondary to per annual soiling Undergone suprapubic catheter placement by urology in 2015 Stroke in 1997 with left hemiparesis. No hx of CAD, She has hx of cardiomegaly and had an episode of CHF years ago but no Echo in the system, Hx of breast cancer in 1990 sp radiation, chemo and lumpectomy.   IN ER:  HR 66, Blood pressure 186/86  Examination by ER  physician showed that patient most likely bleeding from urethra UA showed possible evidence of infection she was diagnosed with hemorrhagic cystitis and given IV Rocephin At 10:40 PM patient had a bowel movement consistent of formed yellow stool but the pad was also saturated with bloody fluid with some clots. Family states that in the past if her catheter has been obstructed she had so urine come out from the urethra and a suspect that most likely this has happened together bowel movement. Rectal exam showed yellow stool in the rectal vault no blood. Urethral meters and vaginal exam showed no evidence of bleeding either. There is evidence of bloody discharge at the suprapubic catheter opening CT of abdomen was done showing no evidence of acute abnormality moderate rectal stool WBC 6.5 Hgb 11.2 platelets 162 potassium 3.3 creatinine 0.61 BUN 10 INR 1.09    Hospitalist was called for admission for bright red blood per rectum  Review of Systems:    Pertinent positives include: Hematuria, abdominal distention  Constitutional:  No weight loss, night sweats, Fevers, chills, fatigue, weight loss  HEENT:  No headaches, Difficulty swallowing,Tooth/dental problems,Sore throat,  No sneezing, itching, ear ache, nasal congestion, post nasal drip,  Cardio-vascular:  No chest pain, Orthopnea, PND, anasarca, dizziness, palpitations.no Bilateral lower extremity swelling  GI:  No heartburn, indigestion, abdominal pain, nausea, vomiting, diarrhea, change in bowel habits, loss of appetite, melena, blood in stool, hematemesis Resp:  no shortness of breath at rest. No dyspnea on exertion, No excess mucus, no productive cough, No non-productive cough, No  coughing up of blood.No change in color of mucus.No wheezing. Skin:  no rash or lesions. No jaundice GU:  no dysuria, change in color of urine, no urgency or frequency. No straining to urinate.  No flank  pain.  Musculoskeletal:  No joint pain or no joint swelling. No decreased range of motion. No back pain.  Psych:  No change in mood or affect. No depression or anxiety. No memory loss.  Neuro: no localizing neurological complaints, no tingling, no weakness, no double vision, no gait abnormality, no slurred speech, no confusion  As per HPI otherwise 10 point review of systems negative.   Past Medical History: Past Medical History  Diagnosis Date  . Anemia   . Arthritis   . COPD (chronic obstructive pulmonary disease) ([place])   . Chronic schizophrenia ([place])   . Dementia   . Depressive disorder   . Acute sinusitis   . Schizophrenia ([place])   . Osteoporosis   . Stenosis of lumbosacral spine   . Glaucoma   . On home oxygen therapy     2 L PER NASAL CANNULA  . GERD (gastroesophageal reflux disease)   . Stroke (HCC)     L hemiparesis  . Cervical spondylosis   . DM (diabetes mellitus) ([place])     DIET CONTROLLED  . Chronic kidney disease     chronic kidney disease per MD notes  . CHF (congestive heart failure) (HCC)     chronic and stable per MD note  . Hyperlipidemia   . Hypertension   . Foley catheter in place   . Wheelchair dependent   . Breast cancer [facility])    Past Surgical History  Procedure Laterality Date  . Appendectomy    . Cataract extraction, bilateral    . Cholecystectomy    . Uterine suspension    . Joint replacement      L TOTAL KNEE  . Breast surgery      breast cancer  . Insertion of suprapubic catheter N/A 04/29/2014    Procedure: INSERTION OF SUPRAPUBIC CATHETER;  Surgeon: Arvil Persons, MD;  Location: [facility]S;  Service: Urology;  Laterality: N/A;     Social History:  Ambulatory  bed bound  From facility SNF [facility]   reports that she quit smoking about 18 years ago. Her smoking use included Cigarettes. She has a 15 pack-year smoking history. She does not have any smokeless tobacco history on file. She reports that she does not drink alcohol or use  illicit drugs.  Allergies:   Allergies  Allergen Reactions  . Lisinopril     Per MAR leg weakness     Family History:  Family History  Problem Relation Age of Onset  . Asthma Daughter   . Heart disease Sister     3 sisters  . Heart disease Brother     x 2  . Cancer Brother     multiple myloma    Medications: Prior to Admission medications   Medication Sig Start Date End Date Taking? Authorizing Provider  acetaminophen (TYLENOL) 500 MG tablet Take 500 mg by mouth 3 (three) times daily.    Yes Historical Provider, MD  albuterol (PROVENTIL) (2.5 MG/3ML) 0.083% nebulizer solution Take 2.5 mg by nebulization 3 (three) times daily.   Yes Historical Provider, MD  amLODipine (NORVASC) 5 MG tablet Take 5 mg by mouth daily.   Yes Historical Provider, MD  aspirin 81 MG chewable tablet Chew 81 mg by mouth daily.   Yes Historical  Provider, MD  budesonide (PULMICORT) 0.5 MG/2ML nebulizer solution Take 0.5 mg by nebulization 2 (two) times daily.   Yes Historical Provider, MD  Cholecalciferol (VITAMIN D3) 2000 UNITS TABS Take by mouth daily. For vitamin D deficiency   Yes Historical Provider, MD  cycloSPORINE (RESTASIS) 0.05 % ophthalmic emulsion Place 1 drop into both eyes 2 (two) times daily.    Yes Historical Provider, MD  donepezil (ARICEPT) 10 MG tablet Take 10 mg by mouth at bedtime.    Yes Historical Provider, MD  estradiol (ESTRACE) 0.1 MG/GM vaginal cream Place 1 Applicatorful vaginally 2 (two) times a week.   Yes Historical Provider, MD  FLUPHENAZINE DECANOATE IJ Inject 25 mg/mL as directed every 30 (thirty) days.    Yes Historical Provider, MD  fluticasone (FLONASE) 50 MCG/ACT nasal spray Place 2 sprays into the nose daily. Reported on 11/10/2015   Yes Historical Provider, MD  gabapentin (NEURONTIN) 100 MG capsule Take 100 mg by mouth 2 (two) times daily.   Yes Historical Provider, MD  gabapentin (NEURONTIN) 300 MG capsule Take 300 mg by mouth at bedtime.   Yes Historical Provider, MD   hydrALAZINE (APRESOLINE) 25 MG tablet Take 25 mg by mouth 2 (two) times daily.   Yes Historical Provider, MD  loratadine (CLARITIN) 10 MG tablet Take 10 mg by mouth daily.   Yes Historical Provider, MD  olopatadine (PATANOL) 0.1 % ophthalmic solution Place 1 drop into both eyes 2 (two) times daily.    Yes Historical Provider, MD  omeprazole (PRILOSEC) 20 MG capsule Take 20 mg by mouth daily.   Yes Historical Provider, MD  OXYGEN Inhale 2 L into the lungs.   Yes Historical Provider, MD  polyethylene glycol (MIRALAX / GLYCOLAX) packet Take 17 g by mouth daily.   Yes Historical Provider, MD  Propylene Glycol (SYSTANE BALANCE OP) Apply to eye. 1 drop in both eyes twice a day   Yes Historical Provider, MD  sennosides-docusate sodium (SENOKOT-S) 8.6-50 MG tablet Take 2 tablets by mouth 2 (two) times daily. HOLD FOR LOOSE STOOLS   Yes Historical Provider, MD  sertraline (ZOLOFT) 50 MG tablet 1 1/2 by mouth daily for depression   Yes Historical Provider, MD  [place] Name: white vinegar irrigate suprapubic tube every other day with 1/2 sterile water & 1/2 vinegar in the morning (100 cc of each = 200 cc)   Yes Historical Provider, MD  vitamin B-12 (CYANOCOBALAMIN) 500 MCG tablet Take 500 mcg by mouth daily.   Yes Historical Provider, MD  vitamin C (ASCORBIC ACID) 500 MG tablet Take 500 mg by mouth daily. Supplement   Yes Historical Provider, MD  cephALEXin (KEFLEX) 500 MG capsule Take 1 capsule (500 mg total) by mouth 4 (four) times daily. 01/20/16   Lacretia Leigh, MD  HYDROcodone-acetaminophen (NORCO/VICODIN) 5-325 MG per tablet Take 0.5 tablets by mouth every 6 (six) hours as needed for moderate pain. Patient not taking: Reported on 01/20/2016 12/22/14   Lauree Chandler, NP    Physical Exam: Patient Vitals for the past 24 hrs:  BP Temp Temp src Pulse Resp SpO2  01/20/16 2140 155/70 mmHg - - 70 13 97 %  01/20/16 2017 165/95 mmHg - - 69 15 100 %  01/20/16 1756 147/80 mmHg 98.4 F (36.9 C) Oral  66 18 100 %    1. General:  in No Acute distress 2. Psychological: Alert and Oriented To self and situation 3. Head/ENT:   Dry Mucous Membranes  Head Non traumatic, neck supple                          Poor Dentition 4. SKIN: decreased Skin turgor,  Skin clean Dry and intact no rash 5. Heart: Regular rate and rhythm no  Murmur, Rub or gallop 6. Lungs: no wheezes or crackles  Distant 7. Abdomen: Soft, non-tender, slightly distended 8. Lower extremities: no clubbing, cyanosis, or edema 9. Neurologically Grossly intact, moving all 4 extremities equally 10. MSK: Normal range of motion Rectal exam no evidence of blood per Examination, Hemoccult negative Vaginal exam negative for bleeding   body mass index is unknown because there is no weight on file.  Labs on Admission:   Labs on Admission: I have personally reviewed following labs and imaging studies  CBC:  Recent Labs Lab 01/20/16 1900  WBC 6.5  NEUTROABS 3.7  HGB 11.2*  HCT 35.3*  MCV 83.5  PLT 0000000   Basic Metabolic Panel:  Recent Labs Lab 01/20/16 1900  NA 142  K 3.3*  CL 107  CO2 26  GLUCOSE 125*  BUN 10  CREATININE 0.61  CALCIUM 9.9   GFR: Estimated Creatinine Clearance: 60 mL/min (by C-G formula based on Cr of 0.61). Liver Function Tests:  Recent Labs Lab 01/20/16 1900  AST 21  ALT 12*  ALKPHOS 85  BILITOT 0.4  PROT 6.8  ALBUMIN 3.5   No results for input(s): LIPASE, AMYLASE in the last 168 hours. No results for input(s): AMMONIA in the last 168 hours. Coagulation Profile:  Recent Labs Lab 01/20/16 1900  INR 1.09   Cardiac Enzymes: No results for input(s): CKTOTAL, CKMB, CKMBINDEX, TROPONINI in the last 168 hours. BNP (last 3 results) No results for input(s): PROBNP in the last 8760 hours. HbA1C: No results for input(s): HGBA1C in the last 72 hours. CBG: No results for input(s): GLUCAP in the last 168 hours. Lipid Profile: No results for input(s): CHOL, HDL,  LDLCALC, TRIG, CHOLHDL, LDLDIRECT in the last 72 hours. Thyroid Function Tests: No results for input(s): TSH, T4TOTAL, FREET4, T3FREE, THYROIDAB in the last 72 hours. Anemia Panel: No results for input(s): VITAMINB12, FOLATE, FERRITIN, TIBC, IRON, RETICCTPCT in the last 72 hours. Urine analysis:    Component Value Date/Time   COLORURINE YELLOW 01/20/2016 2000   COLORURINE Yellow 02/08/2014 1200   APPEARANCEUR CLOUDY* 01/20/2016 2000   APPEARANCEUR Cloudy 02/08/2014 1200   LABSPEC 1.021 01/20/2016 2000   LABSPEC 1.014 02/08/2014 1200   PHURINE 7.0 01/20/2016 2000   PHURINE 8.0 02/08/2014 1200   GLUCOSEU NEGATIVE 01/20/2016 2000   GLUCOSEU Negative 02/08/2014 1200   HGBUR LARGE* 01/20/2016 2000   HGBUR 2+ 02/08/2014 1200   BILIRUBINUR NEGATIVE 01/20/2016 2000   BILIRUBINUR Negative 02/08/2014 1200   KETONESUR NEGATIVE 01/20/2016 2000   KETONESUR Negative 02/08/2014 1200   PROTEINUR >300* 01/20/2016 2000   PROTEINUR Negative 02/08/2014 1200   UROBILINOGEN 0.2 12/21/2013 1132   NITRITE POSITIVE* 01/20/2016 2000   NITRITE Negative 02/08/2014 1200   LEUKOCYTESUR MODERATE* 01/20/2016 2000   LEUKOCYTESUR 2+ 02/08/2014 1200   Sepsis Labs: @LABRCNTIP (procalcitonin:4,lacticidven:4) )No results found for this or any previous visit (from the past 240 hour(s)).    UA Too numerous to count whites as well as red blood cells few bacteria but nitrate positive  Lab Results  Component Value Date   HGBA1C 5.6 12/25/2015    Estimated Creatinine Clearance: 60 mL/min (by C-G formula based on Cr of 0.61).  BNP (last  3 results) No results for input(s): PROBNP in the last 8760 hours.   ECG REPORT Not obtained  There were no vitals filed for this visit.   Cultures:    Component Value Date/Time   SDES URINE, CATHETERIZED 12/21/2013 1132   SPECREQUEST NONE 12/21/2013 1132   CULT  12/21/2013 1132    ENTEROCOCCUS SPECIES Performed at [place] 12/23/2013 FINAL  12/21/2013 1132     Radiological Exams on Admission: Ct Abdomen Pelvis W Contrast  01/20/2016  CLINICAL DATA:  82 year old female with abdominal pain and distention. EXAM: CT ABDOMEN AND PELVIS WITH CONTRAST TECHNIQUE: Multidetector CT imaging of the abdomen and pelvis was performed using the standard protocol following bolus administration of intravenous contrast. CONTRAST:  167mL ISOVUE-300 IOPAMIDOL (ISOVUE-300) INJECTION 61% COMPARISON:  12/21/2013 CT FINDINGS: Lower chest:  Cardiomegaly and mild bibasilar scarring noted. Hepatobiliary: The liver and gallbladder are unremarkable. There is no evidence of biliary dilatation. Pancreas: Unremarkable Spleen: Unremarkable Adrenals/Urinary Tract: Bilateral renal cortical atrophy and renal cysts are present. The largest cyst measures 12 cm within the left upper pole. There is no evidence of solid mass or hydronephrosis. A stable 1.5 cm left adrenal adenoma is noted. The right adrenal gland is unremarkable. A suprapubic catheter within the bladder is noted. Stomach/Bowel: No bowel obstruction or focal bowel wall thickening noted. A moderate amount of rectal stool is present. Vascular/Lymphatic: Aortic atherosclerotic calcifications noted without aneurysm. No enlarged lymph nodes are identified. Reproductive: Unremarkable Other: No free fluid, pneumoperitoneum or abscess. Musculoskeletal: No acute or suspicious abnormalities identified. Diffuse osteopenia noted. IMPRESSION: No evidence of acute abnormality. Moderate rectal stool. Cardiomegaly and aortic atherosclerosis. Suprapubic catheter within the bladder. Electronically Signed   By: Margarette Canada M.D.   On: 01/20/2016 21:05    Chart has been reviewed    Assessment/Plan   82 y.o. female bed bound residing at SNF with medical history significant of vascular dementia, chronic kidney disease, hypertension,  diabetes with neuropathy, COPD chronically on oxygen,  status post suprapubic catheter, history of CVA  with residual left-sided weakness bed bound here with hemorrhagic cystitis   Present on Admission:  . Hematuria. We'll admit for observation monitor CBC, Urology consult in AM, rectal bleeding much less likely given no evidence of blood per rectum on rectal exam  . hemorrhagic Cystitis - for now continue with IV Rocephin await results of urine culture  . Chronic kidney disease, unspecified ([place]) currently improved no evidence of renal failure  . Chronic respiratory failure with hypoxia (HCC) continue oxygen monitor  . Chronic schizophrenia ([place]) stable continue home medications  . COPD (chronic obstructive pulmonary disease) ([place]) - continue home medications and oxygen  . Essential hypertension, benign - continue home medications currently stable  . Type 2 diabetes mellitus with diabetic nephropathy ([place]) - well-controlled continue home medications order sliding scale  . Urinary retention with incomplete bladder emptying status post suprapubic catheter will continue suprapubic catheter for now. Defer to urology given hematuria most likely secondary to hemorrhagic cystitis  . Vascular dementia expect some degree of sundowning but monitor   history of CHF associated cardiomegaly currently appears to be stable euvolemic   diarrhea  - Will obtain stool studies  . Hypokalemia -will replace History of schizophrenia chronic currently stable Other plan as per orders.  DVT prophylaxis:  SCD   Code Status:  FULL CODE  as per patient    Family Communication:   Family   at  Bedside  plan of care was  discussed with Daughter Curlene Labrum [phone], Verneda Skill [phone]  Disposition Plan:   Back to current facility when stable   Consults called: none   Admission status:   Observation        Level of care   SCD   I have spent a total of 75 min on this admission extra time was taken to discuss case with family extensively as well as to perform extensive rectal and vulval exam to  determine source of bleeding  Lorane Cousar 01/21/2016, 12:02 AM    [facility]  Pager [phone]   after 2 AM please page floor coverage PA If 7AM-7PM, please contact the day team taking care of the patient  [email or web address]  Password [email or web address]

## 2016-01-20 NOTE — ED Notes (Signed)
[facility] notified that pt would be admitted and not discharged at this time.

## 2016-01-20 NOTE — ED Notes (Signed)
Dr. Zenia Resides at bedside to examine pt for potential vaginal bleeding. Pt also cleaned at that time of formed stool. Per Dr.Allen pt will be discharged back to facility. Family at bedside during this time.

## 2016-01-21 DIAGNOSIS — Z853 Personal history of malignant neoplasm of breast: Secondary | ICD-10-CM | POA: Diagnosis not present

## 2016-01-21 DIAGNOSIS — Z993 Dependence on wheelchair: Secondary | ICD-10-CM | POA: Diagnosis not present

## 2016-01-21 DIAGNOSIS — E1140 Type 2 diabetes mellitus with diabetic neuropathy, unspecified: Secondary | ICD-10-CM | POA: Diagnosis present

## 2016-01-21 DIAGNOSIS — Z7401 Bed confinement status: Secondary | ICD-10-CM | POA: Diagnosis not present

## 2016-01-21 DIAGNOSIS — K219 Gastro-esophageal reflux disease without esophagitis: Secondary | ICD-10-CM | POA: Diagnosis present

## 2016-01-21 DIAGNOSIS — N189 Chronic kidney disease, unspecified: Secondary | ICD-10-CM

## 2016-01-21 DIAGNOSIS — F329 Major depressive disorder, single episode, unspecified: Secondary | ICD-10-CM | POA: Diagnosis present

## 2016-01-21 DIAGNOSIS — Z79899 Other long term (current) drug therapy: Secondary | ICD-10-CM | POA: Diagnosis not present

## 2016-01-21 DIAGNOSIS — N3091 Cystitis, unspecified with hematuria: Secondary | ICD-10-CM | POA: Diagnosis present

## 2016-01-21 DIAGNOSIS — J449 Chronic obstructive pulmonary disease, unspecified: Secondary | ICD-10-CM | POA: Diagnosis present

## 2016-01-21 DIAGNOSIS — F209 Schizophrenia, unspecified: Secondary | ICD-10-CM | POA: Diagnosis present

## 2016-01-21 DIAGNOSIS — R319 Hematuria, unspecified: Secondary | ICD-10-CM

## 2016-01-21 DIAGNOSIS — E1142 Type 2 diabetes mellitus with diabetic polyneuropathy: Secondary | ICD-10-CM

## 2016-01-21 DIAGNOSIS — Z87891 Personal history of nicotine dependence: Secondary | ICD-10-CM | POA: Diagnosis not present

## 2016-01-21 DIAGNOSIS — M810 Age-related osteoporosis without current pathological fracture: Secondary | ICD-10-CM | POA: Diagnosis present

## 2016-01-21 DIAGNOSIS — I1310 Hypertensive heart and chronic kidney disease without heart failure, with stage 1 through stage 4 chronic kidney disease, or unspecified chronic kidney disease: Secondary | ICD-10-CM | POA: Diagnosis present

## 2016-01-21 DIAGNOSIS — Z7982 Long term (current) use of aspirin: Secondary | ICD-10-CM | POA: Diagnosis not present

## 2016-01-21 DIAGNOSIS — Z9981 Dependence on supplemental oxygen: Secondary | ICD-10-CM | POA: Diagnosis not present

## 2016-01-21 DIAGNOSIS — I69354 Hemiplegia and hemiparesis following cerebral infarction affecting left non-dominant side: Secondary | ICD-10-CM | POA: Diagnosis not present

## 2016-01-21 DIAGNOSIS — Z7951 Long term (current) use of inhaled steroids: Secondary | ICD-10-CM | POA: Diagnosis not present

## 2016-01-21 DIAGNOSIS — R339 Retention of urine, unspecified: Secondary | ICD-10-CM | POA: Diagnosis present

## 2016-01-21 DIAGNOSIS — I10 Essential (primary) hypertension: Secondary | ICD-10-CM

## 2016-01-21 DIAGNOSIS — Z888 Allergy status to other drugs, medicaments and biological substances status: Secondary | ICD-10-CM | POA: Diagnosis not present

## 2016-01-21 DIAGNOSIS — J9611 Chronic respiratory failure with hypoxia: Secondary | ICD-10-CM | POA: Diagnosis present

## 2016-01-21 DIAGNOSIS — E538 Deficiency of other specified B group vitamins: Secondary | ICD-10-CM | POA: Diagnosis present

## 2016-01-21 DIAGNOSIS — E876 Hypokalemia: Secondary | ICD-10-CM

## 2016-01-21 DIAGNOSIS — F0150 Vascular dementia without behavioral disturbance: Secondary | ICD-10-CM | POA: Diagnosis present

## 2016-01-21 DIAGNOSIS — E1122 Type 2 diabetes mellitus with diabetic chronic kidney disease: Secondary | ICD-10-CM | POA: Diagnosis present

## 2016-01-21 DIAGNOSIS — E785 Hyperlipidemia, unspecified: Secondary | ICD-10-CM | POA: Diagnosis present

## 2016-01-21 DIAGNOSIS — Z807 Family history of other malignant neoplasms of lymphoid, hematopoietic and related tissues: Secondary | ICD-10-CM | POA: Diagnosis not present

## 2016-01-21 DIAGNOSIS — Z8249 Family history of ischemic heart disease and other diseases of the circulatory system: Secondary | ICD-10-CM | POA: Diagnosis not present

## 2016-01-21 DIAGNOSIS — H409 Unspecified glaucoma: Secondary | ICD-10-CM | POA: Diagnosis present

## 2016-01-21 LAB — CBC
HCT: 35.3 % — ABNORMAL LOW (ref 36.0–46.0)
HCT: 33.1 % — ABNORMAL LOW (ref 36.0–46.0)
HEMOGLOBIN: 10.9 g/dL — AB (ref 12.0–15.0)
HEMOGLOBIN: 10.4 g/dL — AB (ref 12.0–15.0)
MCH: 26.3 pg (ref 26.0–34.0)
MCH: 26.3 pg (ref 26.0–34.0)
MCHC: 30.9 g/dL (ref 30.0–36.0)
MCHC: 31.4 g/dL (ref 30.0–36.0)
MCV: 85.3 fL (ref 78.0–100.0)
MCV: 83.8 fL (ref 78.0–100.0)
PLATELETS: 161 10*3/uL (ref 150–400)
Platelets: 164 10*3/uL (ref 150–400)
RBC: 4.14 MIL/uL (ref 3.87–5.11)
RBC: 3.95 MIL/uL (ref 3.87–5.11)
RDW: 17.2 % — ABNORMAL HIGH (ref 11.5–15.5)
RDW: 17 % — ABNORMAL HIGH (ref 11.5–15.5)
WBC: 7.7 10*3/uL (ref 4.0–10.5)
WBC: 7.5 10*3/uL (ref 4.0–10.5)

## 2016-01-21 LAB — GLUCOSE, CAPILLARY
GLUCOSE-CAPILLARY: 99 mg/dL (ref 65–99)
GLUCOSE-CAPILLARY: 119 mg/dL — AB (ref 65–99)
Glucose-Capillary: 104 mg/dL — ABNORMAL HIGH (ref 65–99)
Glucose-Capillary: 137 mg/dL — ABNORMAL HIGH (ref 65–99)
Glucose-Capillary: 129 mg/dL — ABNORMAL HIGH (ref 65–99)

## 2016-01-21 LAB — COMPREHENSIVE METABOLIC PANEL
ALT: 12 U/L — ABNORMAL LOW (ref 14–54)
ANION GAP: 9 (ref 5–15)
AST: 14 U/L — ABNORMAL LOW (ref 15–41)
Albumin: 3.6 g/dL (ref 3.5–5.0)
Alkaline Phosphatase: 82 U/L (ref 38–126)
BILIRUBIN TOTAL: 0.5 mg/dL (ref 0.3–1.2)
BUN: 8 mg/dL (ref 6–20)
CALCIUM: 9.8 mg/dL (ref 8.9–10.3)
CO2: 27 mmol/L (ref 22–32)
Chloride: 107 mmol/L (ref 101–111)
Creatinine, Ser: 0.59 mg/dL (ref 0.44–1.00)
GFR calc Af Amer: >60 mL/min (ref >60)
Glucose, Bld: 115 mg/dL — ABNORMAL HIGH (ref 65–99)
POTASSIUM: 3.2 mmol/L — AB (ref 3.5–5.1)
Sodium: 143 mmol/L (ref 135–145)
TOTAL PROTEIN: 6.8 g/dL (ref 6.5–8.1)

## 2016-01-21 LAB — MAGNESIUM: MAGNESIUM: 1.7 mg/dL (ref 1.7–2.4)

## 2016-01-21 LAB — PHOSPHORUS: PHOSPHORUS: 3 mg/dL (ref 2.5–4.6)

## 2016-01-21 LAB — TSH: TSH: 1.725 u[IU]/mL (ref 0.350–4.500)

## 2016-01-21 LAB — ABO/RH: ABO/RH(D): A POS

## 2016-01-21 LAB — MRSA PCR SCREENING: MRSA BY PCR: NEGATIVE

## 2016-01-21 MED ORDER — BUDESONIDE 0.5 MG/2ML IN SUSP
0.5000 mg | Freq: Two times a day (BID) | RESPIRATORY_TRACT | Status: DC
  Administered 2016-01-21 – 2016-01-23 (×5): 0.5 mg via RESPIRATORY_TRACT
  Filled 2016-01-21 (×5): qty 2

## 2016-01-21 MED ORDER — FAMOTIDINE 20 MG PO TABS
20.0000 mg | ORAL_TABLET | Freq: Two times a day (BID) | ORAL | Status: DC | PRN
  Filled 2016-01-21: qty 1

## 2016-01-21 MED ORDER — AMLODIPINE BESYLATE 5 MG PO TABS
5.0000 mg | ORAL_TABLET | Freq: Every day | ORAL | Status: DC
  Administered 2016-01-21 – 2016-01-23 (×3): 5 mg via ORAL
  Filled 2016-01-21 (×3): qty 1

## 2016-01-21 MED ORDER — ONDANSETRON HCL 4 MG/2ML IJ SOLN: 4.0000 mg | Freq: Four times a day (QID) | INTRAMUSCULAR | Status: DC | PRN

## 2016-01-21 MED ORDER — INSULIN ASPART 100 UNIT/ML ~~LOC~~ SOLN: 0.0000 [IU] | Freq: Three times a day (TID) | SUBCUTANEOUS | Status: DC

## 2016-01-21 MED ORDER — ALBUTEROL SULFATE (2.5 MG/3ML) 0.083% IN NEBU
2.5000 mg | INHALATION_SOLUTION | Freq: Three times a day (TID) | RESPIRATORY_TRACT | Status: DC
  Administered 2016-01-21 – 2016-01-22 (×4): 2.5 mg via RESPIRATORY_TRACT
  Filled 2016-01-21 (×4): qty 3

## 2016-01-21 MED ORDER — LORATADINE 10 MG PO TABS
10.0000 mg | ORAL_TABLET | Freq: Every day | ORAL | Status: DC
Start: 2016-01-21 — End: 2016-01-23
  Administered 2016-01-21 – 2016-01-23 (×3): 10 mg via ORAL
  Filled 2016-01-21 (×3): qty 1

## 2016-01-21 MED ORDER — SODIUM CHLORIDE 0.9 % IV SOLN
INTRAVENOUS | Status: AC
  Administered 2016-01-21: 02:00:00 via INTRAVENOUS

## 2016-01-21 MED ORDER — HYDROCODONE-ACETAMINOPHEN 5-325 MG PO TABS: 1.0000 | ORAL_TABLET | ORAL | Status: DC | PRN

## 2016-01-21 MED ORDER — PANTOPRAZOLE SODIUM 40 MG PO TBEC
40.0000 mg | DELAYED_RELEASE_TABLET | Freq: Every day | ORAL | Status: DC
  Administered 2016-01-21: 40 mg via ORAL
  Filled 2016-01-21: qty 1

## 2016-01-21 MED ORDER — POTASSIUM CHLORIDE CRYS ER 20 MEQ PO TBCR
40.0000 meq | EXTENDED_RELEASE_TABLET | Freq: Once | ORAL | Status: AC
  Administered 2016-01-21: 40 meq via ORAL
  Filled 2016-01-21: qty 2

## 2016-01-21 MED ORDER — ACETAMINOPHEN 650 MG RE SUPP: 650.0000 mg | Freq: Four times a day (QID) | RECTAL | Status: DC | PRN

## 2016-01-21 MED ORDER — GABAPENTIN 300 MG PO CAPS
300.0000 mg | ORAL_CAPSULE | Freq: Every day | ORAL | Status: DC
  Administered 2016-01-21 – 2016-01-22 (×3): 300 mg via ORAL
  Filled 2016-01-21 (×4): qty 1

## 2016-01-21 MED ORDER — CYCLOSPORINE 0.05 % OP EMUL
1.0000 [drp] | Freq: Two times a day (BID) | OPHTHALMIC | Status: DC
  Administered 2016-01-21: 02:00:00 via OPHTHALMIC
  Administered 2016-01-21 – 2016-01-23 (×5): 1 [drp] via OPHTHALMIC
  Filled 2016-01-21 (×7): qty 1

## 2016-01-21 MED ORDER — ACETAMINOPHEN 325 MG PO TABS: 650.0000 mg | ORAL_TABLET | Freq: Four times a day (QID) | ORAL | Status: DC | PRN

## 2016-01-21 MED ORDER — SERTRALINE HCL 50 MG PO TABS
75.0000 mg | ORAL_TABLET | Freq: Every day | ORAL | Status: DC
  Administered 2016-01-21 – 2016-01-23 (×3): 75 mg via ORAL
  Filled 2016-01-21 (×3): qty 1

## 2016-01-21 MED ORDER — GABAPENTIN 100 MG PO CAPS
100.0000 mg | ORAL_CAPSULE | Freq: Two times a day (BID) | ORAL | Status: DC
  Filled 2016-01-21 (×2): qty 1

## 2016-01-21 MED ORDER — DONEPEZIL HCL 10 MG PO TABS
10.0000 mg | ORAL_TABLET | Freq: Every day | ORAL | Status: DC
  Administered 2016-01-21 – 2016-01-22 (×3): 10 mg via ORAL
  Filled 2016-01-21 (×4): qty 1

## 2016-01-21 MED ORDER — SACCHAROMYCES BOULARDII 250 MG PO CAPS
250.0000 mg | ORAL_CAPSULE | Freq: Two times a day (BID) | ORAL | Status: DC
  Administered 2016-01-21 – 2016-01-23 (×5): 250 mg via ORAL
  Filled 2016-01-21 (×5): qty 1

## 2016-01-21 MED ORDER — INSULIN ASPART 100 UNIT/ML ~~LOC~~ SOLN: 0.0000 [IU] | Freq: Every day | SUBCUTANEOUS | Status: DC

## 2016-01-21 MED ORDER — GABAPENTIN 100 MG PO CAPS
100.0000 mg | ORAL_CAPSULE | Freq: Two times a day (BID) | ORAL | Status: DC
  Administered 2016-01-21 – 2016-01-23 (×5): 100 mg via ORAL
  Filled 2016-01-21 (×8): qty 1

## 2016-01-21 MED ORDER — ONDANSETRON HCL 4 MG PO TABS: 4.0000 mg | ORAL_TABLET | Freq: Four times a day (QID) | ORAL | Status: DC | PRN

## 2016-01-21 MED ORDER — HYDRALAZINE HCL 25 MG PO TABS
25.0000 mg | ORAL_TABLET | Freq: Two times a day (BID) | ORAL | Status: DC
  Administered 2016-01-21 – 2016-01-23 (×6): 25 mg via ORAL
  Filled 2016-01-21 (×7): qty 1

## 2016-01-21 NOTE — Consult Note (Signed)
Urology Consult   Physician requesting consult: Debbe Odea, MD   Reason for consult: Hematuria  History of Present Illness: [name] is a 82 y.o. with a chronic suprapubic tube. The patient is a poor historian given her dementia. The patient was brought in from her facility due to family concern, no family at bedside. The patient had at least several day history of "dark urine" and was transferred for evaluation. No report of fevers or signs of infection including leaking around the catheter. The patient has a normal creatinine and a normal white count.    Past Medical History  Diagnosis Date  . Anemia   . Arthritis   . COPD (chronic obstructive pulmonary disease) ([place])   . Chronic schizophrenia ([place])   . Dementia   . Depressive disorder   . Acute sinusitis   . Schizophrenia ([place])   . Osteoporosis   . Stenosis of lumbosacral spine   . Glaucoma   . On home oxygen therapy     2 L PER NASAL CANNULA  . GERD (gastroesophageal reflux disease)   . Stroke (HCC)     L hemiparesis  . Cervical spondylosis   . DM (diabetes mellitus) ([place])     DIET CONTROLLED  . Chronic kidney disease     chronic kidney disease per MD notes  . CHF (congestive heart failure) (HCC)     chronic and stable per MD note  . Hyperlipidemia   . Hypertension   . Foley catheter in place   . Wheelchair dependent   . Breast cancer [facility])     Past Surgical History  Procedure Laterality Date  . Appendectomy    . Cataract extraction, bilateral    . Cholecystectomy    . Uterine suspension    . Joint replacement      L TOTAL KNEE  . Breast surgery      breast cancer  . Insertion of suprapubic catheter N/A 04/29/2014    Procedure: INSERTION OF SUPRAPUBIC CATHETER;  Surgeon: Arvil Persons, MD;  Location: [facility];  Service: Urology;  Laterality: N/A;     Current Hospital Medications:  Home meds:    Medication List    TAKE these medications        cephALEXin 500 MG capsule  Commonly known as:  KEFLEX   Take 1 capsule (500 mg total) by mouth 4 (four) times daily.      ASK your doctor about these medications        acetaminophen 500 MG tablet  Commonly known as:  TYLENOL  Take 500 mg by mouth 3 (three) times daily.     albuterol (2.5 MG/3ML) 0.083% nebulizer solution  Commonly known as:  PROVENTIL  Take 2.5 mg by nebulization 3 (three) times daily.     amLODipine 5 MG tablet  Commonly known as:  NORVASC  Take 5 mg by mouth daily.     aspirin 81 MG chewable tablet  Chew 81 mg by mouth daily.     budesonide 0.5 MG/2ML nebulizer solution  Commonly known as:  PULMICORT  Take 0.5 mg by nebulization 2 (two) times daily.     cycloSPORINE 0.05 % ophthalmic emulsion  Commonly known as:  RESTASIS  Place 1 drop into both eyes 2 (two) times daily.     donepezil 10 MG tablet  Commonly known as:  ARICEPT  Take 10 mg by mouth at bedtime.     estradiol 0.1 MG/GM vaginal cream  Commonly known as:  ESTRACE  Place 1 Applicatorful vaginally 2 (two) times a week.     FLUPHENAZINE DECANOATE IJ  Inject 25 mg/mL as directed every 30 (thirty) days.     fluticasone 50 MCG/ACT nasal spray  Commonly known as:  FLONASE  Place 2 sprays into the nose daily. Reported on 11/10/2015     gabapentin 300 MG capsule  Commonly known as:  NEURONTIN  Take 300 mg by mouth at bedtime.     gabapentin 100 MG capsule  Commonly known as:  NEURONTIN  Take 100 mg by mouth 2 (two) times daily.     hydrALAZINE 25 MG tablet  Commonly known as:  APRESOLINE  Take 25 mg by mouth 2 (two) times daily.     HYDROcodone-acetaminophen 5-325 MG tablet  Commonly known as:  NORCO/VICODIN  Take 0.5 tablets by mouth every 6 (six) hours as needed for moderate pain.     loratadine 10 MG tablet  Commonly known as:  CLARITIN  Take 10 mg by mouth daily.     olopatadine 0.1 % ophthalmic solution  Commonly known as:  PATANOL  Place 1 drop into both eyes 2 (two) times daily.     omeprazole 20 MG capsule  Commonly known  as:  PRILOSEC  Take 20 mg by mouth daily.     OXYGEN  Inhale 2 L into the lungs.     polyethylene glycol packet  Commonly known as:  MIRALAX / GLYCOLAX  Take 17 g by mouth daily.     sennosides-docusate sodium 8.6-50 MG tablet  Commonly known as:  SENOKOT-S  Take 2 tablets by mouth 2 (two) times daily. HOLD FOR LOOSE STOOLS     sertraline 50 MG tablet  Commonly known as:  ZOLOFT  1 1/2 by mouth daily for depression     SYSTANE BALANCE OP  Apply to eye. 1 drop in both eyes twice a day     UNABLE TO FIND  Med Name: white vinegar irrigate suprapubic tube every other day with 1/2 sterile water & 1/2 vinegar in the morning (100 cc of each = 200 cc)     vitamin B-12 500 MCG tablet  Commonly known as:  CYANOCOBALAMIN  Take 500 mcg by mouth daily.     vitamin C 500 MG tablet  Commonly known as:  ASCORBIC ACID  Take 500 mg by mouth daily. Supplement     Vitamin D3 2000 units Tabs  Take by mouth daily. For vitamin D deficiency        Scheduled Meds: . albuterol  2.5 mg Nebulization TID  . amLODipine  5 mg Oral Daily  . budesonide  0.5 mg Nebulization BID  . cefTRIAXone (ROCEPHIN)  IV  1 g Intravenous Q24H  . cycloSPORINE  1 drop Both Eyes BID  . donepezil  10 mg Oral QHS  . gabapentin  100 mg Oral BID  . gabapentin  300 mg Oral QHS  . hydrALAZINE  25 mg Oral BID  . insulin aspart  0-15 Units Subcutaneous TID WC  . insulin aspart  0-5 Units Subcutaneous QHS  . loratadine  10 mg Oral Daily  . pantoprazole  40 mg Oral Daily  . sertraline  75 mg Oral Daily   Continuous Infusions: . sodium chloride 20 mL/hr at 01/21/16 0300  . sodium chloride 50 mL/hr at 01/21/16 0212   PRN Meds:.acetaminophen **OR** acetaminophen, HYDROcodone-acetaminophen, ondansetron **OR** ondansetron (ZOFRAN) IV  Allergies:  Allergies  Allergen Reactions  . Lisinopril     Per MAR leg  weakness    Family History  Problem Relation Age of Onset  . Asthma Daughter   . Heart disease Sister     3  sisters  . Heart disease Brother     x 2  . Cancer Brother     multiple myloma    Social History:  reports that she quit smoking about 18 years ago. Her smoking use included Cigarettes. She has a 15 pack-year smoking history. She does not have any smokeless tobacco history on file. She reports that she does not drink alcohol or use illicit drugs.  ROS: A complete review of systems was performed.  All systems are negative except for pertinent findings as noted.  Physical Exam:  Vital signs in last 24 hours: Temp:  [98 F (36.7 C)-98.8 F (37.1 C)] 98.8 F (37.1 C) (05/07 1010) Pulse Rate:  [65-72] 72 (05/07 1010) Resp:  [13-18] 16 (05/07 1010) BP: (131-186)/(68-95) 133/78 mmHg (05/07 1010) SpO2:  [97 %-100 %] 100 % (05/07 1010) Weight:  [95.21 kg (209 lb 14.4 oz)] 95.21 kg (209 lb 14.4 oz) (05/07 0107) Constitutional:  Alert and oriented, No acute distress Cardiovascular: Regular rate and rhythm, No JVD Respiratory: Normal respiratory effort, Lungs clear bilaterally GI: Abdomen is soft, nontender, nondistended, no abdominal masses GU: SPT in place with merlot colored urine in the tube draining well Lymphatic: No lymphadenopathy Neurologic: Grossly intact, no focal deficits Psychiatric: Normal mood and affect  Laboratory Data:   Recent Labs  01/20/16 1900 01/21/16 0428  WBC 6.5 7.7  HGB 11.2* 10.9*  HCT 35.3* 35.3*  PLT 162 164     Recent Labs  01/20/16 1900 01/21/16 0428  NA 142 143  K 3.3* 3.2*  CL 107 107  GLUCOSE 125* 115*  BUN 10 8  CALCIUM 9.9 9.8  CREATININE 0.61 0.59     Results for orders placed or performed during the hospital encounter of 01/20/16 (from the past 24 hour(s))  CBC with Differential/Platelet     Status: Abnormal   Collection Time: 01/20/16  7:00 PM  Result Value Ref Range   WBC 6.5 4.0 - 10.5 K/uL   RBC 4.23 3.87 - 5.11 MIL/uL   Hemoglobin 11.2 (L) 12.0 - 15.0 g/dL   HCT 35.3 (L) 36.0 - 46.0 %   MCV 83.5 78.0 - 100.0 fL   MCH  26.5 26.0 - 34.0 pg   MCHC 31.7 30.0 - 36.0 g/dL   RDW 16.8 (H) 11.5 - 15.5 %   Platelets 162 150 - 400 K/uL   Neutrophils Relative % 58 %   Neutro Abs 3.7 1.7 - 7.7 K/uL   Lymphocytes Relative 33 %   Lymphs Abs 2.2 0.7 - 4.0 K/uL   Monocytes Relative 6 %   Monocytes Absolute 0.4 0.1 - 1.0 K/uL   Eosinophils Relative 3 %   Eosinophils Absolute 0.2 0.0 - 0.7 K/uL   Basophils Relative 0 %   Basophils Absolute 0.0 0.0 - 0.1 K/uL  Comprehensive metabolic panel     Status: Abnormal   Collection Time: 01/20/16  7:00 PM  Result Value Ref Range   Sodium 142 135 - 145 mmol/L   Potassium 3.3 (L) 3.5 - 5.1 mmol/L   Chloride 107 101 - 111 mmol/L   CO2 26 22 - 32 mmol/L   Glucose, Bld 125 (H) 65 - 99 mg/dL   BUN 10 6 - 20 mg/dL   Creatinine, Ser 0.61 0.44 - 1.00 mg/dL   Calcium 9.9 8.9 - 10.3 mg/dL  Total Protein 6.8 6.5 - 8.1 g/dL   Albumin 3.5 3.5 - 5.0 g/dL   AST 21 15 - 41 U/L   ALT 12 (L) 14 - 54 U/L   Alkaline Phosphatase 85 38 - 126 U/L   Total Bilirubin 0.4 0.3 - 1.2 mg/dL   GFR calc non Af Amer >60 >60 mL/min   GFR calc Af Amer >60 >60 mL/min   Anion gap 9 5 - 15  Protime-INR     Status: None   Collection Time: 01/20/16  7:00 PM  Result Value Ref Range   Prothrombin Time 13.8 11.6 - 15.2 seconds   INR 1.09 0.00 - 1.49  APTT     Status: None   Collection Time: 01/20/16  7:00 PM  Result Value Ref Range   aPTT 36 24 - 37 seconds  Type and screen     Status: None   Collection Time: 01/20/16  7:01 PM  Result Value Ref Range   ABO/RH(D) A POS    Antibody Screen NEG    Sample Expiration 01/23/2016   ABO/Rh     Status: None   Collection Time: 01/20/16  7:01 PM  Result Value Ref Range   ABO/RH(D) A POS   No blood products     Status: None   Collection Time: 01/20/16  7:01 PM  Result Value Ref Range   Transfuse no blood products      TRANSFUSE NO BLOOD PRODUCTS, VERIFIED BY ILENE COGGIN,RN  ALEX ALLEN,MD  Urinalysis, Routine w reflex microscopic (not at [facility])     Status:  Abnormal   Collection Time: 01/20/16  8:00 PM  Result Value Ref Range   Color, Urine YELLOW YELLOW   APPearance CLOUDY (A) CLEAR   Specific Gravity, Urine 1.021 1.005 - 1.030   pH 7.0 5.0 - 8.0   Glucose, UA NEGATIVE NEGATIVE mg/dL   Hgb urine dipstick LARGE (A) NEGATIVE   Bilirubin Urine NEGATIVE NEGATIVE   Ketones, ur NEGATIVE NEGATIVE mg/dL   Protein, ur >300 (A) NEGATIVE mg/dL   Nitrite POSITIVE (A) NEGATIVE   Leukocytes, UA MODERATE (A) NEGATIVE  Urine microscopic-add on     Status: Abnormal   Collection Time: 01/20/16  8:00 PM  Result Value Ref Range   Squamous Epithelial / LPF 0-5 (A) NONE SEEN   WBC, UA TOO NUMEROUS TO COUNT 0 - 5 WBC/hpf   RBC / HPF TOO NUMEROUS TO COUNT 0 - 5 RBC/hpf   Bacteria, UA FEW (A) NONE SEEN   Urine-Other MUCOUS PRESENT   POC occult blood, ED Provider will collect     Status: None   Collection Time: 01/20/16 11:35 PM  Result Value Ref Range   Fecal Occult Bld NEGATIVE NEGATIVE  Glucose, capillary     Status: Abnormal   Collection Time: 01/21/16  1:27 AM  Result Value Ref Range   Glucose-Capillary 104 (H) 65 - 99 mg/dL  MRSA PCR Screening     Status: None   Collection Time: 01/21/16  2:24 AM  Result Value Ref Range   MRSA by PCR NEGATIVE NEGATIVE  Magnesium     Status: None   Collection Time: 01/21/16  4:28 AM  Result Value Ref Range   Magnesium 1.7 1.7 - 2.4 mg/dL  Phosphorus     Status: None   Collection Time: 01/21/16  4:28 AM  Result Value Ref Range   Phosphorus 3.0 2.5 - 4.6 mg/dL  TSH     Status: None   Collection Time: 01/21/16  4:28 AM  Result Value Ref Range   TSH 1.725 0.350 - 4.500 uIU/mL  Comprehensive metabolic panel     Status: Abnormal   Collection Time: 01/21/16  4:28 AM  Result Value Ref Range   Sodium 143 135 - 145 mmol/L   Potassium 3.2 (L) 3.5 - 5.1 mmol/L   Chloride 107 101 - 111 mmol/L   CO2 27 22 - 32 mmol/L   Glucose, Bld 115 (H) 65 - 99 mg/dL   BUN 8 6 - 20 mg/dL   Creatinine, Ser 0.59 0.44 - 1.00 mg/dL    Calcium 9.8 8.9 - 10.3 mg/dL   Total Protein 6.8 6.5 - 8.1 g/dL   Albumin 3.6 3.5 - 5.0 g/dL   AST 14 (L) 15 - 41 U/L   ALT 12 (L) 14 - 54 U/L   Alkaline Phosphatase 82 38 - 126 U/L   Total Bilirubin 0.5 0.3 - 1.2 mg/dL   GFR calc non Af Amer >60 >60 mL/min   GFR calc Af Amer >60 >60 mL/min   Anion gap 9 5 - 15  CBC     Status: Abnormal   Collection Time: 01/21/16  4:28 AM  Result Value Ref Range   WBC 7.7 4.0 - 10.5 K/uL   RBC 4.14 3.87 - 5.11 MIL/uL   Hemoglobin 10.9 (L) 12.0 - 15.0 g/dL   HCT 35.3 (L) 36.0 - 46.0 %   MCV 85.3 78.0 - 100.0 fL   MCH 26.3 26.0 - 34.0 pg   MCHC 30.9 30.0 - 36.0 g/dL   RDW 17.2 (H) 11.5 - 15.5 %   Platelets 164 150 - 400 K/uL   Recent Results (from the past 240 hour(s))  MRSA PCR Screening     Status: None   Collection Time: 01/21/16  2:24 AM  Result Value Ref Range Status   MRSA by PCR NEGATIVE NEGATIVE Final    Comment:        The GeneXpert MRSA Assay (FDA approved for NASAL specimens only), is one component of a comprehensive MRSA colonization surveillance program. It is not intended to diagnose MRSA infection nor to guide or monitor treatment for MRSA infections.     Renal Function:  Recent Labs  01/20/16 1900 01/21/16 0428  CREATININE 0.61 0.59   Estimated Creatinine Clearance: 59.7 mL/min (by C-G formula based on Cr of 0.59).  Radiologic Imaging: Ct Abdomen Pelvis W Contrast  01/20/2016  CLINICAL DATA:  82 year old female with abdominal pain and distention. EXAM: CT ABDOMEN AND PELVIS WITH CONTRAST TECHNIQUE: Multidetector CT imaging of the abdomen and pelvis was performed using the standard protocol following bolus administration of intravenous contrast. CONTRAST:  143mL ISOVUE-300 IOPAMIDOL (ISOVUE-300) INJECTION 61% COMPARISON:  12/21/2013 CT FINDINGS: Lower chest:  Cardiomegaly and mild bibasilar scarring noted. Hepatobiliary: The liver and gallbladder are unremarkable. There is no evidence of biliary dilatation.  Pancreas: Unremarkable Spleen: Unremarkable Adrenals/Urinary Tract: Bilateral renal cortical atrophy and renal cysts are present. The largest cyst measures 12 cm within the left upper pole. There is no evidence of solid mass or hydronephrosis. A stable 1.5 cm left adrenal adenoma is noted. The right adrenal gland is unremarkable. A suprapubic catheter within the bladder is noted. Stomach/Bowel: No bowel obstruction or focal bowel wall thickening noted. A moderate amount of rectal stool is present. Vascular/Lymphatic: Aortic atherosclerotic calcifications noted without aneurysm. No enlarged lymph nodes are identified. Reproductive: Unremarkable Other: No free fluid, pneumoperitoneum or abscess. Musculoskeletal: No acute or suspicious abnormalities identified. Diffuse osteopenia noted. IMPRESSION:  No evidence of acute abnormality. Moderate rectal stool. Cardiomegaly and aortic atherosclerosis. Suprapubic catheter within the bladder. Electronically Signed   By: Margarette Canada M.D.   On: 01/20/2016 21:05    I independently reviewed the above imaging studies.  Impression/Recommendation: 82 y.o. female with chronic SPT with hematuria. - treat empirically for UTI and follow cultures - SPT draining well, no need for exchange or irrigation at this time - f/u with urology as an outpatient - contact urology if the SPT stops draining.   I performed a history and physical examination of the patient and discussed his management with the resident.  I reviewed the resident's note and agree with the documented findings and plan of care      Christell Faith 01/21/2016, 11:04 AM

## 2016-01-21 NOTE — NC FL2 (Signed)
[place] LEVEL OF CARE SCREENING TOOL     IDENTIFICATION  Patient Name: [name] Birthdate: [birth year] Sex: female Admission Date (Current Location): 01/20/2016  [facility] and [place] Number:  Herbalist and Address:  [facility],  [place] [address], [place]      Provider Number: [id number]  Attending Physician Name and Address:  Debbe Odea, MD  Relative Name and Phone Number:       Current Level of Care: Hospital Recommended Level of Care: [place] Prior Approval Number:    Date Approved/Denied:   PASRR Number:    Discharge Plan: SNF    Current Diagnoses: Patient Active Problem List   Diagnosis Date Noted  . Hemorrhagic cystitis 01/21/2016  . Cystitis 01/20/2016  . Hypokalemia 01/20/2016  . Hematuria 01/20/2016  . Diarrhea 01/20/2016  . Type 2 diabetes mellitus with diabetic neuropathy, without long-term current use of insulin ([place]) 01/10/2016  . Rhinitis, allergic 01/10/2016  . Allergic conjunctivitis and rhinitis 12/07/2015  . Essential hypertension, benign 11/10/2015  . Constipation, chronic 11/10/2015  . Hyperlipidemia 04/15/2015  . B12 deficiency 04/15/2015  . Stage 2 skin ulcer of sacral region 03/02/2015  . Depression due to dementia 03/02/2015  . E-coli UTI 03/02/2015  . Dysphagia S/P CVA (cerebrovascular accident) 01/23/2015  . Neurogenic dysfunction of the urinary bladder 01/09/2015  . Major depressive disorder, recurrent, in remission ([place]) 01/09/2015  . Pain, neuropathic 01/09/2015  . Hypertensive heart and renal disease with congestive heart failure ([place]) 01/09/2015  . Chronic systolic heart failure ([place]) 01/09/2015  . Type 2 diabetes mellitus with polyneuropathy ([place]) 01/09/2015  . Chronic respiratory failure with hypoxia ([place]) 10/14/2014  . CKD stage 2 due to type 2 diabetes mellitus ([place]) 10/14/2014  . Type 2 diabetes mellitus with diabetic nephropathy ([place]) 10/14/2014  .  Neuropathic pain 09/15/2014  . Senile osteoporosis 09/15/2014  . Primary generalized (osteo)arthritis 09/15/2014  . Neurogenic bladder 06/13/2014  . Hemiplegia of nondominant side following CVA (cerebrovascular accident) ([place]) 06/13/2014  . Chronic respiratory failure, unspecified whether with hypoxia or hypercapnia ([place]) 06/13/2014  . Osteoporosis, unspecified 06/13/2014  . Anemia of other chronic disease 06/13/2014  . Vascular dementia 04/14/2014  . Other and unspecified hyperlipidemia 04/14/2014  . Unspecified schizophrenia, chronic condition(295.92) 04/14/2014  . Congestive heart failure ([place]) 04/14/2014  . Benign hypertensive heart and kidney disease with heart failure and with chronic kidney disease stage I through stage IV, or unspecified(404.11) 04/14/2014  . Type II or unspecified type diabetes mellitus with neurological manifestations, not stated as uncontrolled 04/14/2014  . Urinary retention with incomplete bladder emptying 04/12/2014  . Unspecified vitamin D deficiency 02/28/2014  . Hemiplegia affecting nondominant side, late effect of cerebrovascular disease 02/28/2014  . Polyneuropathy in diabetes(357.2) 02/28/2014  . Esophageal reflux 02/28/2014  . Peripheral angiopathy in diseases classified elsewhere ([place]) 02/28/2014  . Chronic kidney disease, unspecified ([place]) 02/28/2014  . Anemia   . Chronic schizophrenia ([place])   . Dementia   . Depressive disorder   . DM (diabetes mellitus) ([place])   . Stroke ([place])   . COPD (chronic obstructive pulmonary disease) ([place]) 05/13/2012    Orientation RESPIRATION BLADDER Height & Weight     Self  O2 (2L [place]) Indwelling catheter Weight: 209 lb 14.4 oz (95.21 kg) Height:  5\' 9"  (175.3 cm)  BEHAVIORAL SYMPTOMS/MOOD NEUROLOGICAL BOWEL NUTRITION STATUS      Continent Diet (see DC)  AMBULATORY STATUS COMMUNICATION OF NEEDS Skin   Total Care Verbally Normal  Personal Care Assistance Level of Assistance  Bathing,  Dressing Bathing Assistance: Maximum assistance   Dressing Assistance: Maximum assistance     Functional Limitations Info             SPECIAL CARE FACTORS FREQUENCY                       Contractures      Additional Factors Info  Code Status, Allergies, Psychotropic, Insulin Sliding Scale Code Status Info: FULL Allergies Info: Lisinopril Psychotropic Info: zoloft Insulin Sliding Scale Info: 4/day       Current Medications (01/21/2016):  This is the current hospital active medication list Current Facility-Administered Medications  Medication Dose Route Frequency Provider Last Rate Last Dose  . 0.9 %  sodium chloride infusion   Intravenous Continuous Lacretia Leigh, MD 20 mL/hr at 01/21/16 0300    . 0.9 %  sodium chloride infusion   Intravenous Continuous Toy Baker, MD 50 mL/hr at 01/21/16 0212    . acetaminophen (TYLENOL) tablet 650 mg  650 mg Oral Q6H PRN Toy Baker, MD       Or  . acetaminophen (TYLENOL) suppository 650 mg  650 mg Rectal Q6H PRN Toy Baker, MD      . albuterol (PROVENTIL) (2.5 MG/3ML) 0.083% nebulizer solution 2.5 mg  2.5 mg Nebulization TID Toy Baker, MD   2.5 mg at 01/21/16 0808  . amLODipine (NORVASC) tablet 5 mg  5 mg Oral Daily Toy Baker, MD   5 mg at 01/21/16 1012  . budesonide (PULMICORT) nebulizer solution 0.5 mg  0.5 mg Nebulization BID Toy Baker, MD   0.5 mg at 01/21/16 0808  . cefTRIAXone (ROCEPHIN) 1 g in dextrose 5 % 50 mL IVPB  1 g Intravenous Q24H Lacretia Leigh, MD   Stopped at 01/20/16 2210  . cycloSPORINE (RESTASIS) 0.05 % ophthalmic emulsion 1 drop  1 drop Both Eyes BID Toy Baker, MD   1 drop at 01/21/16 1014  . donepezil (ARICEPT) tablet 10 mg  10 mg Oral QHS Toy Baker, MD   10 mg at 01/21/16 0211  . gabapentin (NEURONTIN) capsule 100 mg  100 mg Oral BID Toy Baker, MD   100 mg at 01/21/16 1014  . gabapentin (NEURONTIN) capsule 300 mg  300 mg Oral QHS  Toy Baker, MD   300 mg at 01/21/16 0211  . hydrALAZINE (APRESOLINE) tablet 25 mg  25 mg Oral BID Toy Baker, MD   25 mg at 01/21/16 1014  . HYDROcodone-acetaminophen (NORCO/VICODIN) 5-325 MG per tablet 1-2 tablet  1-2 tablet Oral Q4H PRN Toy Baker, MD      . insulin aspart (novoLOG) injection 0-15 Units  0-15 Units Subcutaneous TID WC Toy Baker, MD   0 Units at 01/21/16 0800  . insulin aspart (novoLOG) injection 0-5 Units  0-5 Units Subcutaneous QHS Toy Baker, MD   0 Units at 01/21/16 0115  . loratadine (CLARITIN) tablet 10 mg  10 mg Oral Daily Toy Baker, MD   10 mg at 01/21/16 1012  . ondansetron (ZOFRAN) tablet 4 mg  4 mg Oral Q6H PRN Toy Baker, MD       Or  . ondansetron (ZOFRAN) injection 4 mg  4 mg Intravenous Q6H PRN Toy Baker, MD      . pantoprazole (PROTONIX) EC tablet 40 mg  40 mg Oral Daily Toy Baker, MD   40 mg at 01/21/16 1012  . sertraline (ZOLOFT) tablet 75 mg  75 mg Oral Daily Anastassia Doutova,  MD   75 mg at 01/21/16 1012     Discharge Medications: Please see discharge summary for a list of discharge medications.  Relevant Imaging Results:  Relevant Lab Results:   Additional Information SS# [id number]  Cranford Mon, [place]

## 2016-01-21 NOTE — Progress Notes (Signed)
PROGRESS NOTE    [name]  [id number] DOB: 1926-06-28 DOA: 01/20/2016  PCP: Wenda Low, MD  Outpatient Specialists:   Brief Narrative:  [name] is a 82 y.o. Female jehovah's witness, bed bound residing at SNF with medical history significant of vascular dementia, schizophrenia, hypertension, COPD chronically on oxygen, urinary retention status post suprapubic catheter, history of CVA with residual left-sided weakness bed bound. She was sent to the ER for hematuria. She had a large amount stool, blood and clots on pad noted in ER. Stool occult was negative. Vaginal exam did not show blood  Subjective: Pain in lower abdomen - points to suparpubic area. No nausea or vomiting. No diarrhea.  Assessment & Plan:   Principal Problem:    Urinary retention with incomplete bladder emptying with suprapubic foley now with Hematuria - Urology has evaluated the patient and are recommending to continue empiric antibiotics for possible hemorrhagic cystitis- - d/c Protonix to prevent c diff- start florastor - no need to flush sp catheter per urology as urine is draining well - aspirin on hold  Active Problems:  Hypokalemia - replace and recheck    COPD (chronic obstructive pulmonary disease) /   Chronic respiratory failure with hypoxia  - cont nebs and O2    Chronic schizophrenia  - cont home meds- stable  Essential hypertension, benign  - Norvasc and Hydralazine  Vascular dementia - on Aricept  CVA - left sided is weak- left arm contracted- holding ASA due to hematuria- not on statin??- PCP to address     DM with neuropathy - Hb 5.7 off on meds  B 12 deficiency -cont oral replacement   DVT prophylaxis: SCDs Code Status: Full code Family Communication: daughter Disposition Plan: return to SNF when stable Consultants:   urology Procedures:   none Antimicrobials:  Anti-infectives    Start     Dose/Rate Route Frequency Ordered Stop   01/20/16 2145  cefTRIAXone  (ROCEPHIN) 1 g in dextrose 5 % 50 mL IVPB     1 g 100 mL/hr over 30 Minutes Intravenous Every 24 hours 01/20/16 2134     01/20/16 0000  cephALEXin (KEFLEX) 500 MG capsule     500 mg Oral 4 times daily 01/20/16 2137         Objective: Filed Vitals:   01/21/16 0638 01/21/16 0810 01/21/16 0811 01/21/16 1010  BP: 131/68   133/78  Pulse: 65   72  Temp: 98.5 F (36.9 C)   98.8 F (37.1 C)  TempSrc: Oral   Axillary  Resp: 15   16  Height:      Weight:      SpO2: 98% 100% 100% 100%    Intake/Output Summary (Last 24 hours) at 01/21/16 1218 Last data filed at 01/21/16 1010  Gross per 24 hour  Intake    460 ml  Output   1225 ml  Net   -765 ml   Filed Weights   01/21/16 0107  Weight: 95.21 kg (209 lb 14.4 oz)    Examination: General exam: Appears comfortable  HEENT: PERRLA, oral mucosa moist, no sclera icterus or thrush Respiratory system: Clear to auscultation. Respiratory effort normal. Cardiovascular system: S1 & S2 heard, RRR.  No murmurs  Gastrointestinal system: Abdomen soft, suprapubic tenderness, nondistended. Normal bowel sound. No organomegaly Central nervous system: Alert and oriented. No focal neurological deficits. Extremities: No cyanosis, clubbing or edema GU- suprapubic cath intact- hematuria Skin: No rashes or ulcers Psychiatry:  Mood & affect appropriate.  Data Reviewed: I have personally reviewed following labs and imaging studies  CBC:  Recent Labs Lab 01/20/16 1900 01/21/16 0428  WBC 6.5 7.7  NEUTROABS 3.7  --   HGB 11.2* 10.9*  HCT 35.3* 35.3*  MCV 83.5 85.3  PLT 162 [id number]   Basic Metabolic Panel:  Recent Labs Lab 01/20/16 1900 01/21/16 0428  NA 142 143  K 3.3* 3.2*  CL 107 107  CO2 26 27  GLUCOSE 125* 115*  BUN 10 8  CREATININE 0.61 0.59  CALCIUM 9.9 9.8  MG  --  1.7  PHOS  --  3.0   GFR: Estimated Creatinine Clearance: 59.7 mL/min (by C-G formula based on Cr of 0.59). Liver Function Tests:  Recent Labs Lab  01/20/16 1900 01/21/16 0428  AST 21 14*  ALT 12* 12*  ALKPHOS 85 82  BILITOT 0.4 0.5  PROT 6.8 6.8  ALBUMIN 3.5 3.6   No results for input(s): LIPASE, AMYLASE in the last 168 hours. No results for input(s): AMMONIA in the last 168 hours. Coagulation Profile:  Recent Labs Lab 01/20/16 1900  INR 1.09   Cardiac Enzymes: No results for input(s): CKTOTAL, CKMB, CKMBINDEX, TROPONINI in the last 168 hours. BNP (last 3 results) No results for input(s): PROBNP in the last 8760 hours. HbA1C: No results for input(s): HGBA1C in the last 72 hours. CBG:  Recent Labs Lab 01/21/16 0127  GLUCAP 104*   Lipid Profile: No results for input(s): CHOL, HDL, LDLCALC, TRIG, CHOLHDL, LDLDIRECT in the last 72 hours. Thyroid Function Tests:  Recent Labs  01/21/16 0428  TSH 1.725   Anemia Panel: No results for input(s): VITAMINB12, FOLATE, FERRITIN, TIBC, IRON, RETICCTPCT in the last 72 hours. Urine analysis:    Component Value Date/Time   COLORURINE YELLOW 01/20/2016 2000   COLORURINE Yellow 02/08/2014 1200   APPEARANCEUR CLOUDY* 01/20/2016 2000   APPEARANCEUR Cloudy 02/08/2014 1200   LABSPEC 1.021 01/20/2016 2000   LABSPEC 1.014 02/08/2014 1200   PHURINE 7.0 01/20/2016 2000   PHURINE 8.0 02/08/2014 1200   GLUCOSEU NEGATIVE 01/20/2016 2000   GLUCOSEU Negative 02/08/2014 1200   HGBUR LARGE* 01/20/2016 2000   HGBUR 2+ 02/08/2014 1200   BILIRUBINUR NEGATIVE 01/20/2016 2000   BILIRUBINUR Negative 02/08/2014 1200   KETONESUR NEGATIVE 01/20/2016 2000   KETONESUR Negative 02/08/2014 1200   PROTEINUR >300* 01/20/2016 2000   PROTEINUR Negative 02/08/2014 1200   UROBILINOGEN 0.2 12/21/2013 1132   NITRITE POSITIVE* 01/20/2016 2000   NITRITE Negative 02/08/2014 1200   LEUKOCYTESUR MODERATE* 01/20/2016 2000   LEUKOCYTESUR 2+ 02/08/2014 1200   Sepsis Labs: @LABRCNTIP (procalcitonin:4,lacticidven:4)  ) Recent Results (from the past 240 hour(s))  MRSA PCR Screening     Status: None    Collection Time: 01/21/16  2:24 AM  Result Value Ref Range Status   MRSA by PCR NEGATIVE NEGATIVE Final    Comment:        The GeneXpert MRSA Assay (FDA approved for NASAL specimens only), is one component of a comprehensive MRSA colonization surveillance program. It is not intended to diagnose MRSA infection nor to guide or monitor treatment for MRSA infections.          Radiology Studies: Ct Abdomen Pelvis W Contrast  01/20/2016  CLINICAL DATA:  82 year old female with abdominal pain and distention. EXAM: CT ABDOMEN AND PELVIS WITH CONTRAST TECHNIQUE: Multidetector CT imaging of the abdomen and pelvis was performed using the standard protocol following bolus administration of intravenous contrast. CONTRAST:  1100mL ISOVUE-300 IOPAMIDOL (ISOVUE-300) INJECTION 61% COMPARISON:  12/21/2013 CT FINDINGS: Lower chest:  Cardiomegaly and mild bibasilar scarring noted. Hepatobiliary: The liver and gallbladder are unremarkable. There is no evidence of biliary dilatation. Pancreas: Unremarkable Spleen: Unremarkable Adrenals/Urinary Tract: Bilateral renal cortical atrophy and renal cysts are present. The largest cyst measures 12 cm within the left upper pole. There is no evidence of solid mass or hydronephrosis. A stable 1.5 cm left adrenal adenoma is noted. The right adrenal gland is unremarkable. A suprapubic catheter within the bladder is noted. Stomach/Bowel: No bowel obstruction or focal bowel wall thickening noted. A moderate amount of rectal stool is present. Vascular/Lymphatic: Aortic atherosclerotic calcifications noted without aneurysm. No enlarged lymph nodes are identified. Reproductive: Unremarkable Other: No free fluid, pneumoperitoneum or abscess. Musculoskeletal: No acute or suspicious abnormalities identified. Diffuse osteopenia noted. IMPRESSION: No evidence of acute abnormality. Moderate rectal stool. Cardiomegaly and aortic atherosclerosis. Suprapubic catheter within the bladder.  Electronically Signed   By: Margarette Canada M.D.   On: 01/20/2016 21:05        Scheduled Meds: . albuterol  2.5 mg Nebulization TID  . amLODipine  5 mg Oral Daily  . budesonide  0.5 mg Nebulization BID  . cefTRIAXone (ROCEPHIN)  IV  1 g Intravenous Q24H  . cycloSPORINE  1 drop Both Eyes BID  . donepezil  10 mg Oral QHS  . gabapentin  100 mg Oral BID  . gabapentin  300 mg Oral QHS  . hydrALAZINE  25 mg Oral BID  . insulin aspart  0-15 Units Subcutaneous TID WC  . insulin aspart  0-5 Units Subcutaneous QHS  . loratadine  10 mg Oral Daily  . pantoprazole  40 mg Oral Daily  . sertraline  75 mg Oral Daily   Continuous Infusions: . sodium chloride 20 mL/hr at 01/21/16 0300        Time spent in minutes: [place], MD [facility] Pager: [email or web address] Password [email or web address] 01/21/2016, 12:18 PM

## 2016-01-22 DIAGNOSIS — J449 Chronic obstructive pulmonary disease, unspecified: Secondary | ICD-10-CM

## 2016-01-22 LAB — CBC
HEMATOCRIT: 33.5 % — AB (ref 36.0–46.0)
HEMOGLOBIN: 10.3 g/dL — AB (ref 12.0–15.0)
MCH: 26.5 pg (ref 26.0–34.0)
MCHC: 30.7 g/dL (ref 30.0–36.0)
MCV: 86.1 fL (ref 78.0–100.0)
Platelets: 176 10*3/uL (ref 150–400)
RBC: 3.89 MIL/uL (ref 3.87–5.11)
RDW: 17.5 % — ABNORMAL HIGH (ref 11.5–15.5)
WBC: 6.8 10*3/uL (ref 4.0–10.5)

## 2016-01-22 LAB — BASIC METABOLIC PANEL
Anion gap: 9 (ref 5–15)
BUN: 13 mg/dL (ref 6–20)
CHLORIDE: 109 mmol/L (ref 101–111)
CO2: 24 mmol/L (ref 22–32)
CREATININE: 0.78 mg/dL (ref 0.44–1.00)
Calcium: 9.7 mg/dL (ref 8.9–10.3)
GFR calc Af Amer: >60 mL/min (ref >60)
GFR calc non Af Amer: >60 mL/min (ref >60)
Glucose, Bld: 106 mg/dL — ABNORMAL HIGH (ref 65–99)
Potassium: 3.8 mmol/L (ref 3.5–5.1)
Sodium: 142 mmol/L (ref 135–145)

## 2016-01-22 LAB — HEMOGLOBIN A1C
Hgb A1c MFr Bld: 6 % — ABNORMAL HIGH (ref 4.8–5.6)
MEAN PLASMA GLUCOSE: 126 mg/dL

## 2016-01-22 LAB — TYPE AND SCREEN
ABO/RH(D): A POS
Antibody Screen: NEGATIVE

## 2016-01-22 MED ORDER — POTASSIUM CHLORIDE CRYS ER 20 MEQ PO TBCR
40.0000 meq | EXTENDED_RELEASE_TABLET | Freq: Once | ORAL | Status: AC
  Administered 2016-01-22: 40 meq via ORAL
  Filled 2016-01-22: qty 2

## 2016-01-22 MED ORDER — ALBUTEROL SULFATE (2.5 MG/3ML) 0.083% IN NEBU
2.5000 mg | INHALATION_SOLUTION | Freq: Three times a day (TID) | RESPIRATORY_TRACT | Status: DC
  Administered 2016-01-22 – 2016-01-23 (×3): 2.5 mg via RESPIRATORY_TRACT
  Filled 2016-01-22 (×3): qty 3

## 2016-01-22 NOTE — Progress Notes (Signed)
PROGRESS NOTE    [name]  [id number] DOB: 09-14-1926 DOA: 01/20/2016  PCP: Wenda Low, MD  Outpatient Specialists:   Brief Narrative:  [name] is a 82 y.o. Female jehovah's witness, bed bound residing at SNF with medical history significant of vascular dementia, schizophrenia, hypertension, COPD chronically on oxygen, urinary retention status post suprapubic catheter, history of CVA with residual left-sided weakness bed bound. She was sent to the ER for hematuria. She had a large amount stool, blood and clots on pad noted in ER. Stool occult was negative. Vaginal exam did not show blood  Subjective: No complaints- sleepy this AM- daughter at bedside- No nausea or vomiting. No diarrhea.  Assessment & Plan:   Principal Problem:    Urinary retention with incomplete bladder emptying with suprapubic foley now with Hematuria - Urology has evaluated the patient and are recommending to continue empiric antibiotics for possible hemorrhagic cystitis- - d/c Protonix to prevent c diff- start florastor - no need to flush sp catheter per urology as urine is draining well - aspirin on hold  Active Problems:  Hypokalemia - replaced    COPD (chronic obstructive pulmonary disease) /   Chronic respiratory failure with hypoxia  - cont nebs and O2    Chronic schizophrenia  - cont home meds- stable  Essential hypertension, benign  - Norvasc and Hydralazine  Vascular dementia - on Aricept  CVA - left sided is weak- left arm contracted- holding ASA due to hematuria- not on statin??- PCP to address     DM with neuropathy - Hb 5.7 off on meds  B 12 deficiency -cont oral replacement   DVT prophylaxis: SCDs Code Status: Full code Family Communication: daughter Disposition Plan: return to SNF when stable Consultants:   urology Procedures:   none Antimicrobials:  Anti-infectives    Start     Dose/Rate Route Frequency Ordered Stop   01/20/16 2145  cefTRIAXone  (ROCEPHIN) 1 g in dextrose 5 % 50 mL IVPB     1 g 100 mL/hr over 30 Minutes Intravenous Every 24 hours 01/20/16 2134     01/20/16 0000  cephALEXin (KEFLEX) 500 MG capsule     500 mg Oral 4 times daily 01/20/16 2137         Objective: Filed Vitals:   01/21/16 2214 01/22/16 0131 01/22/16 0613 01/22/16 0948  BP: 130/86 118/71 125/67 126/71  Pulse: 86 81 72 75  Temp: 98.8 F (37.1 C) 98.7 F (37.1 C) 98.4 F (36.9 C) 98.5 F (36.9 C)  TempSrc: Oral Oral Axillary Oral  Resp: 17 17 17    Height:      Weight:      SpO2: 100% 100% 99% 100%    Intake/Output Summary (Last 24 hours) at 01/22/16 1107 Last data filed at 01/22/16 0614  Gross per 24 hour  Intake 736.67 ml  Output   1325 ml  Net -588.33 ml   Filed Weights   01/21/16 0107  Weight: 95.21 kg (209 lb 14.4 oz)    Examination: General exam: Appears comfortable  HEENT: PERRLA, oral mucosa moist, no sclera icterus or thrush Respiratory system: Clear to auscultation. Respiratory effort normal. Cardiovascular system: S1 & S2 heard, RRR.  No murmurs  Gastrointestinal system: Abdomen soft, no more suprapubic tenderness, nondistended. Normal bowel sound. No organomegaly Central nervous system: Alert and oriented. No focal neurological deficits. Extremities: No cyanosis, clubbing or edema GU- suprapubic cath intact- hematuria- does not appear to be fresh blood- blood is maroon/ brown with similar  colored clots Skin: No rashes or ulcers Psychiatry:  Mood & affect appropriate.     Data Reviewed: I have personally reviewed following labs and imaging studies  CBC:  Recent Labs Lab 01/20/16 1900 01/21/16 0428 01/21/16 1200 01/22/16 0413  WBC 6.5 7.7 7.5 6.8  NEUTROABS 3.7  --   --   --   HGB 11.2* 10.9* 10.4* 10.3*  HCT 35.3* 35.3* 33.1* 33.5*  MCV 83.5 85.3 83.8 86.1  PLT 162 164 161 0000000   Basic Metabolic Panel:  Recent Labs Lab 01/20/16 1900 01/21/16 0428 01/22/16 0413  NA 142 143 142  K 3.3* 3.2* 3.8  CL  107 107 109  CO2 26 27 24   GLUCOSE 125* 115* 106*  BUN 10 8 13   CREATININE 0.61 0.59 0.78  CALCIUM 9.9 9.8 9.7  MG  --  1.7  --   PHOS  --  3.0  --    GFR: Estimated Creatinine Clearance: 59.7 mL/min (by C-G formula based on Cr of 0.78). Liver Function Tests:  Recent Labs Lab 01/20/16 1900 01/21/16 0428  AST 21 14*  ALT 12* 12*  ALKPHOS 85 82  BILITOT 0.4 0.5  PROT 6.8 6.8  ALBUMIN 3.5 3.6   No results for input(s): LIPASE, AMYLASE in the last 168 hours. No results for input(s): AMMONIA in the last 168 hours. Coagulation Profile:  Recent Labs Lab 01/20/16 1900  INR 1.09   Cardiac Enzymes: No results for input(s): CKTOTAL, CKMB, CKMBINDEX, TROPONINI in the last 168 hours. BNP (last 3 results) No results for input(s): PROBNP in the last 8760 hours. HbA1C:  Recent Labs  01/21/16 0428  HGBA1C 6.0*   CBG:  Recent Labs Lab 01/21/16 0127 01/21/16 0748 01/21/16 1246 01/21/16 1709 01/21/16 2209  GLUCAP 104* 99 137* 119* 129*   Lipid Profile: No results for input(s): CHOL, HDL, LDLCALC, TRIG, CHOLHDL, LDLDIRECT in the last 72 hours. Thyroid Function Tests:  Recent Labs  01/21/16 0428  TSH 1.725   Anemia Panel: No results for input(s): VITAMINB12, FOLATE, FERRITIN, TIBC, IRON, RETICCTPCT in the last 72 hours. Urine analysis:    Component Value Date/Time   COLORURINE YELLOW 01/20/2016 2000   COLORURINE Yellow 02/08/2014 1200   APPEARANCEUR CLOUDY* 01/20/2016 2000   APPEARANCEUR Cloudy 02/08/2014 1200   LABSPEC 1.021 01/20/2016 2000   LABSPEC 1.014 02/08/2014 1200   PHURINE 7.0 01/20/2016 2000   PHURINE 8.0 02/08/2014 1200   GLUCOSEU NEGATIVE 01/20/2016 2000   GLUCOSEU Negative 02/08/2014 1200   HGBUR LARGE* 01/20/2016 2000   HGBUR 2+ 02/08/2014 1200   BILIRUBINUR NEGATIVE 01/20/2016 2000   BILIRUBINUR Negative 02/08/2014 1200   KETONESUR NEGATIVE 01/20/2016 2000   KETONESUR Negative 02/08/2014 1200   PROTEINUR >300* 01/20/2016 2000   PROTEINUR  Negative 02/08/2014 1200   UROBILINOGEN 0.2 12/21/2013 1132   NITRITE POSITIVE* 01/20/2016 2000   NITRITE Negative 02/08/2014 1200   LEUKOCYTESUR MODERATE* 01/20/2016 2000   LEUKOCYTESUR 2+ 02/08/2014 1200   Sepsis Labs: @LABRCNTIP (procalcitonin:4,lacticidven:4)  ) Recent Results (from the past 240 hour(s))  MRSA PCR Screening     Status: None   Collection Time: 01/21/16  2:24 AM  Result Value Ref Range Status   MRSA by PCR NEGATIVE NEGATIVE Final    Comment:        The GeneXpert MRSA Assay (FDA approved for NASAL specimens only), is one component of a comprehensive MRSA colonization surveillance program. It is not intended to diagnose MRSA infection nor to guide or monitor treatment for MRSA infections.  Radiology Studies: Ct Abdomen Pelvis W Contrast  01/20/2016  CLINICAL DATA:  82 year old female with abdominal pain and distention. EXAM: CT ABDOMEN AND PELVIS WITH CONTRAST TECHNIQUE: Multidetector CT imaging of the abdomen and pelvis was performed using the standard protocol following bolus administration of intravenous contrast. CONTRAST:  166mL ISOVUE-300 IOPAMIDOL (ISOVUE-300) INJECTION 61% COMPARISON:  12/21/2013 CT FINDINGS: Lower chest:  Cardiomegaly and mild bibasilar scarring noted. Hepatobiliary: The liver and gallbladder are unremarkable. There is no evidence of biliary dilatation. Pancreas: Unremarkable Spleen: Unremarkable Adrenals/Urinary Tract: Bilateral renal cortical atrophy and renal cysts are present. The largest cyst measures 12 cm within the left upper pole. There is no evidence of solid mass or hydronephrosis. A stable 1.5 cm left adrenal adenoma is noted. The right adrenal gland is unremarkable. A suprapubic catheter within the bladder is noted. Stomach/Bowel: No bowel obstruction or focal bowel wall thickening noted. A moderate amount of rectal stool is present. Vascular/Lymphatic: Aortic atherosclerotic calcifications noted without aneurysm. No  enlarged lymph nodes are identified. Reproductive: Unremarkable Other: No free fluid, pneumoperitoneum or abscess. Musculoskeletal: No acute or suspicious abnormalities identified. Diffuse osteopenia noted. IMPRESSION: No evidence of acute abnormality. Moderate rectal stool. Cardiomegaly and aortic atherosclerosis. Suprapubic catheter within the bladder. Electronically Signed   By: Margarette Canada M.D.   On: 01/20/2016 21:05        Scheduled Meds: . albuterol  2.5 mg Nebulization TID  . amLODipine  5 mg Oral Daily  . budesonide  0.5 mg Nebulization BID  . cefTRIAXone (ROCEPHIN)  IV  1 g Intravenous Q24H  . cycloSPORINE  1 drop Both Eyes BID  . donepezil  10 mg Oral QHS  . gabapentin  100 mg Oral BID  . gabapentin  300 mg Oral QHS  . hydrALAZINE  25 mg Oral BID  . loratadine  10 mg Oral Daily  . potassium chloride  40 mEq Oral Once  . saccharomyces boulardii  250 mg Oral BID  . sertraline  75 mg Oral Daily   Continuous Infusions: . sodium chloride 20 mL/hr (01/21/16 1257)     LOS: 1 day    Time spent in minutes: 11    [place], MD [facility] Pager: [email or web address] Password [email or web address] 01/22/2016, 11:07 AM

## 2016-01-23 LAB — BASIC METABOLIC PANEL
ANION GAP: 8 (ref 5–15)
BUN: 11 mg/dL (ref 6–20)
CALCIUM: 9.8 mg/dL (ref 8.9–10.3)
CO2: 25 mmol/L (ref 22–32)
CREATININE: 0.68 mg/dL (ref 0.44–1.00)
Chloride: 109 mmol/L (ref 101–111)
Glucose, Bld: 106 mg/dL — ABNORMAL HIGH (ref 65–99)
Potassium: 4.1 mmol/L (ref 3.5–5.1)
SODIUM: 142 mmol/L (ref 135–145)

## 2016-01-23 LAB — CBC
HCT: 32.6 % — ABNORMAL LOW (ref 36.0–46.0)
HEMOGLOBIN: 10.1 g/dL — AB (ref 12.0–15.0)
MCH: 26.9 pg (ref 26.0–34.0)
MCHC: 31 g/dL (ref 30.0–36.0)
MCV: 86.7 fL (ref 78.0–100.0)
PLATELETS: 168 10*3/uL (ref 150–400)
RBC: 3.76 MIL/uL — AB (ref 3.87–5.11)
RDW: 17.5 % — ABNORMAL HIGH (ref 11.5–15.5)
WBC: 6.7 10*3/uL (ref 4.0–10.5)

## 2016-01-23 MED ORDER — BISACODYL 10 MG RE SUPP: 10.0000 mg | Freq: Once | RECTAL | Status: DC

## 2016-01-23 MED ORDER — SACCHAROMYCES BOULARDII 250 MG PO CAPS
250.0000 mg | ORAL_CAPSULE | Freq: Two times a day (BID) | ORAL | Status: AC
Start: 2016-01-23 — End: 2016-01-29

## 2016-01-23 MED ORDER — ASPIRIN 81 MG PO CHEW: 81.0000 mg | CHEWABLE_TABLET | Freq: Every day | ORAL | Status: DC

## 2016-01-23 MED ORDER — CEFPODOXIME PROXETIL 100 MG PO TABS
200.0000 mg | ORAL_TABLET | Freq: Two times a day (BID) | ORAL | Status: AC
Start: 2016-01-23 — End: 2016-01-26

## 2016-01-23 NOTE — Progress Notes (Signed)
1300 : CSW met with pt's daughter's at bedside . They are in agreement with d/c to [facility] today. [facility] transport arranged. # for report provided to nsg.   Werner Lean LCSW [phone]

## 2016-01-23 NOTE — Discharge Summary (Signed)
Physician Discharge Summary  [name] [id number] DOB: [birth year] DOA: 01/20/2016  PCP: Wenda Low, MD  Outpatient Specialists:  Urology, Dr Sharin Grave  Admit date: 01/20/2016 Discharge date: 01/23/2016  Time spent: 50 minutes  Recommendations for Outpatient Follow-up:  1. Aspirin to be resumed on 5/11 if OK with Urology 2. Vantin to stop after last dose on 5/12 unless urology feels it needs to be extended  Discharge Condition: stable    Discharge Diagnoses:  Principal Problem:   Hematuria/  Hemorrhagic cystitis  Active Problems:   Hypokalemia   Urinary retention with incomplete bladder emptying with suprapubic catheter   COPD (chronic obstructive pulmonary disease) (HCC)   Chronic schizophrenia (HCC)   DM (diabetes mellitus) ([place])   Vascular dementia   Congestive heart failure ([place])   Chronic respiratory failure with hypoxia ([place])   Essential hypertension, benign      History of present illness:  [name] is a 82 y.o. Female jehovah's witness, bed bound residing at SNF with medical history significant of vascular dementia, schizophrenia, hypertension, COPD chronically on oxygen, urinary retention status post suprapubic catheter, history of CVA with residual left-sided weakness bed bound. She was sent to the ER for hematuria. She had a large amount stool, blood and clots on pad noted in ER. Stool occult was negative. Vaginal exam did not show blood  Hospital Course:  Principal Problem:  Urinary retention with incomplete bladder emptying with suprapubic foley now with Hematuria due to hemorrhagic cystitis - Urology has evaluated the patient and are recommending to continue empiric antibiotics for possible hemorrhagic cystitis- - started florastor - no need to flush sp catheter per urology as urine is draining well - aspirin held - U culture growing gr neg rods - sensitivities pending but she has clinically improved on Rocephin and therefore will change to [facility] for  total of 7 days.  - no further urethral bleeding- suparpubic tenderness resolved- hematuria (in SP cath) resolved as of yesteryday- I spoke with her Urologist Dr Sharin Grave who agrees with antibiotics -she has and appt for a suparpubic cath change tomorrow- he states he will be in the office and will check in on her  Active Problems:  Hypokalemia - replaced   COPD (chronic obstructive pulmonary disease) / Chronic respiratory failure with hypoxia  - cont nebs and O2   Chronic schizophrenia  - cont home meds- stable  Essential hypertension, benign - Norvasc and Hydralazine  Vascular dementia - on Aricept  CVA - left sided is weak- left arm contracted- holding ASA due to hematuria- not on statin??- PCP to address   DM with neuropathy - Hb 5.7 off on meds  B 12 deficiency -cont oral replacement  Procedures:  none  Consultations:  Urology  Discharge Exam: Filed Weights   01/21/16 0107  Weight: 95.21 kg (209 lb 14.4 oz)   Filed Vitals:   01/22/16 2145 01/23/16 0605  BP: 122/70 134/59  Pulse: 77 71  Temp: 97.8 F (36.6 C) 97.5 F (36.4 C)  Resp: 16 16    General: Alert, no distress Cardiovascular: RRR, no murmurs  Respiratory: clear to auscultation bilaterally GI: soft, non-tender, non-distended, bowel sound positive GU: suparpubic cath draining clear yellow urine  Discharge Instructions You were cared for by a hospitalist during your hospital stay. If you have any questions about your discharge medications or the care you received while you were in the hospital after you are discharged, you can call the unit and asked to speak with the hospitalist on  call if the hospitalist that took care of you is not available. Once you are discharged, your primary care physician will handle any further medical issues. Please note that NO REFILLS for any discharge medications will be authorized once you are discharged, as it is imperative that you return to your primary  care physician (or establish a relationship with a primary care physician if you do not have one) for your aftercare needs so that they can reassess your need for medications and monitor your lab values.      Discharge Instructions    Diet - low sodium heart healthy    Complete by:  As directed      Increase activity slowly    Complete by:  As directed             Medication List    TAKE these medications        acetaminophen 500 MG tablet  Commonly known as:  TYLENOL  Take 500 mg by mouth 3 (three) times daily.     albuterol (2.5 MG/3ML) 0.083% nebulizer solution  Commonly known as:  PROVENTIL  Take 2.5 mg by nebulization 3 (three) times daily.     amLODipine 5 MG tablet  Commonly known as:  NORVASC  Take 5 mg by mouth daily.     aspirin 81 MG chewable tablet  Chew 1 tablet (81 mg total) by mouth daily.  Start taking on:  01/24/2016     budesonide 0.5 MG/2ML nebulizer solution  Commonly known as:  PULMICORT  Take 0.5 mg by nebulization 2 (two) times daily.     cefpodoxime 100 MG tablet  Commonly known as:  VANTIN  Take 2 tablets (200 mg total) by mouth 2 (two) times daily.     cycloSPORINE 0.05 % ophthalmic emulsion  Commonly known as:  RESTASIS  Place 1 drop into both eyes 2 (two) times daily.     donepezil 10 MG tablet  Commonly known as:  ARICEPT  Take 10 mg by mouth at bedtime.     estradiol 0.1 MG/GM vaginal cream  Commonly known as:  ESTRACE  Place 1 Applicatorful vaginally 2 (two) times a week.     FLUPHENAZINE DECANOATE IJ  Inject 25 mg/mL as directed every 30 (thirty) days.     fluticasone 50 MCG/ACT nasal spray  Commonly known as:  FLONASE  Place 2 sprays into the nose daily. Reported on 11/10/2015     gabapentin 300 MG capsule  Commonly known as:  NEURONTIN  Take 300 mg by mouth at bedtime.     gabapentin 100 MG capsule  Commonly known as:  NEURONTIN  Take 100 mg by mouth 2 (two) times daily.     hydrALAZINE 25 MG tablet  Commonly known  as:  APRESOLINE  Take 25 mg by mouth 2 (two) times daily.     loratadine 10 MG tablet  Commonly known as:  CLARITIN  Take 10 mg by mouth daily.     olopatadine 0.1 % ophthalmic solution  Commonly known as:  PATANOL  Place 1 drop into both eyes 2 (two) times daily.     omeprazole 20 MG capsule  Commonly known as:  PRILOSEC  Take 20 mg by mouth daily.     OXYGEN  Inhale 2 L into the lungs.     polyethylene glycol packet  Commonly known as:  MIRALAX / GLYCOLAX  Take 17 g by mouth daily.     saccharomyces boulardii 250 MG capsule  Commonly known  as:  FLORASTOR  Take 1 capsule (250 mg total) by mouth 2 (two) times daily.     sennosides-docusate sodium 8.6-50 MG tablet  Commonly known as:  SENOKOT-S  Take 2 tablets by mouth 2 (two) times daily. HOLD FOR LOOSE STOOLS     sertraline 50 MG tablet  Commonly known as:  ZOLOFT  1 1/2 by mouth daily for depression     SYSTANE BALANCE OP  Apply to eye. 1 drop in both eyes twice a day     UNABLE TO FIND  Med Name: white vinegar irrigate suprapubic tube every other day with 1/2 sterile water & 1/2 vinegar in the morning (100 cc of each = 200 cc)     vitamin B-12 500 MCG tablet  Commonly known as:  CYANOCOBALAMIN  Take 500 mcg by mouth daily.     vitamin C 500 MG tablet  Commonly known as:  ASCORBIC ACID  Take 500 mg by mouth daily. Supplement     Vitamin D3 2000 units Tabs  Take by mouth daily. For vitamin D deficiency       Allergies  Allergen Reactions  . Lisinopril     Per MAR leg weakness      The results of significant diagnostics from this hospitalization (including imaging, microbiology, ancillary and laboratory) are listed below for reference.    Significant Diagnostic Studies: Ct Abdomen Pelvis W Contrast  01/20/2016  CLINICAL DATA:  82 year old female with abdominal pain and distention. EXAM: CT ABDOMEN AND PELVIS WITH CONTRAST TECHNIQUE: Multidetector CT imaging of the abdomen and pelvis was performed using  the standard protocol following bolus administration of intravenous contrast. CONTRAST:  175mL ISOVUE-300 IOPAMIDOL (ISOVUE-300) INJECTION 61% COMPARISON:  12/21/2013 CT FINDINGS: Lower chest:  Cardiomegaly and mild bibasilar scarring noted. Hepatobiliary: The liver and gallbladder are unremarkable. There is no evidence of biliary dilatation. Pancreas: Unremarkable Spleen: Unremarkable Adrenals/Urinary Tract: Bilateral renal cortical atrophy and renal cysts are present. The largest cyst measures 12 cm within the left upper pole. There is no evidence of solid mass or hydronephrosis. A stable 1.5 cm left adrenal adenoma is noted. The right adrenal gland is unremarkable. A suprapubic catheter within the bladder is noted. Stomach/Bowel: No bowel obstruction or focal bowel wall thickening noted. A moderate amount of rectal stool is present. Vascular/Lymphatic: Aortic atherosclerotic calcifications noted without aneurysm. No enlarged lymph nodes are identified. Reproductive: Unremarkable Other: No free fluid, pneumoperitoneum or abscess. Musculoskeletal: No acute or suspicious abnormalities identified. Diffuse osteopenia noted. IMPRESSION: No evidence of acute abnormality. Moderate rectal stool. Cardiomegaly and aortic atherosclerosis. Suprapubic catheter within the bladder. Electronically Signed   By: Margarette Canada M.D.   On: 01/20/2016 21:05    Microbiology: Recent Results (from the past 240 hour(s))  Urine culture     Status: Abnormal (Preliminary result)   Collection Time: 01/20/16  8:00 PM  Result Value Ref Range Status   Specimen Description URINE, SUPRAPUBIC  Final   Special Requests NONE  Final   Culture >=100,000 COLONIES/mL GRAM NEGATIVE RODS (A)  Final   Report Status PENDING  Incomplete  MRSA PCR Screening     Status: None   Collection Time: 01/21/16  2:24 AM  Result Value Ref Range Status   MRSA by PCR NEGATIVE NEGATIVE Final    Comment:        The GeneXpert MRSA Assay (FDA approved for NASAL  specimens only), is one component of a comprehensive MRSA colonization surveillance program. It is not intended to diagnose MRSA infection  nor to guide or monitor treatment for MRSA infections.      Labs: Basic Metabolic Panel:  Recent Labs Lab 01/20/16 1900 01/21/16 0428 01/22/16 0413 01/23/16 0433  NA 142 143 142 142  K 3.3* 3.2* 3.8 4.1  CL 107 107 109 109  CO2 26 27 24 25   GLUCOSE 125* 115* 106* 106*  BUN 10 8 13 11   CREATININE 0.61 0.59 0.78 0.68  CALCIUM 9.9 9.8 9.7 9.8  MG  --  1.7  --   --   PHOS  --  3.0  --   --    Liver Function Tests:  Recent Labs Lab 01/20/16 1900 01/21/16 0428  AST 21 14*  ALT 12* 12*  ALKPHOS 85 82  BILITOT 0.4 0.5  PROT 6.8 6.8  ALBUMIN 3.5 3.6   No results for input(s): LIPASE, AMYLASE in the last 168 hours. No results for input(s): AMMONIA in the last 168 hours. CBC:  Recent Labs Lab 01/20/16 1900 01/21/16 0428 01/21/16 1200 01/22/16 0413 01/23/16 0433  WBC 6.5 7.7 7.5 6.8 6.7  NEUTROABS 3.7  --   --   --   --   HGB 11.2* 10.9* 10.4* 10.3* 10.1*  HCT 35.3* 35.3* 33.1* 33.5* 32.6*  MCV 83.5 85.3 83.8 86.1 86.7  PLT 162 164 161 176 168   Cardiac Enzymes: No results for input(s): CKTOTAL, CKMB, CKMBINDEX, TROPONINI in the last 168 hours. BNP: BNP (last 3 results) No results for input(s): BNP in the last 8760 hours.  ProBNP (last 3 results) No results for input(s): PROBNP in the last 8760 hours.  CBG:  Recent Labs Lab 01/21/16 0127 01/21/16 0748 01/21/16 1246 01/21/16 1709 01/21/16 2209  GLUCAP 104* 99 137* 119* 129*       SignedDebbe Odea, MD [facility] 01/23/2016, 8:47 AM

## 2016-01-23 NOTE — Progress Notes (Signed)
Report called to Juanda Bond at [facility] [phone].

## 2016-01-23 NOTE — Clinical Social Work Note (Signed)
Clinical Social Work Assessment  Patient Details  Name: [name] MRN: [id number] Date of Birth: 01-Jan-1926  Date of referral:  01/23/16               Reason for consult:  Facility Placement, Discharge Planning                Permission sought to share information with:  Facility Art therapist granted to share information::  Yes, Verbal Permission Granted  Name::        Agency::     Relationship::     Contact Information:     Housing/Transportation Living arrangements for the past 2 months:  [name] of Information:  Adult Children Patient Interpreter Needed:  None Criminal Activity/Legal Involvement Pertinent to Current Situation/Hospitalization:  No - Comment as needed Significant Relationships:  Adult Children Lives with:  Facility Resident Do you feel safe going back to the place where you live?  Yes Need for family participation in patient care:  Yes (Comment)  Care giving concerns: Daughter reports , " I have discussed my concerns with facility administration. I expect everything will be fine when my mother returns. "    Social Worker assessment / plan: Pt hospitalized on 01/20/16 with Hematuria. Pt is a LTC resident from [facility]. Pt is alert but not oriented and unable to participate in d/c planning. CSW met with pt's daughter at bedside. " I met with the doctor. My mother will return to [place] today. Will you make sure they have her medications for her. ? "  CSW has sent d/c summary / signed FL2/ transfer report to SNF . [name] has confirmed that they are able to accept pt today and that her medications will be available. Daughter has been updated. Daughter requested [facility] transport. " Oxygen is required for transport. Medical necessity form has been completed. Daughter reports that [facility] transports her mother to all appointments. CSW will check back with nsg/daughter at  1300 to see if pt is ready for transport.  Employment status:   Retired Forensic scientist:  Medicaid In [place] PT Recommendations:  Not assessed at this time Information / Referral to community resources:     Patient/Family's Response to care:  Daughter requesting pt return to [facility].   Patient/Family's Understanding of and Emotional Response to Diagnosis, Current Treatment, and Prognosis:  Daughter is aware of pt's medical status and is in agreement with today's d/c back to SNF.   Emotional Assessment Appearance:    Attitude/Demeanor/Rapport:  Unable to Assess Affect (typically observed):  Unable to Assess Orientation:   (Pt is alert but not oriented.) Alcohol / Substance use:  Not Applicable Psych involvement (Current and /or in the community):  No (Comment)  Discharge Needs  Concerns to be addressed:  Discharge Planning Concerns Readmission within the last 30 days:  No Current discharge risk:  None Barriers to Discharge:  No Barriers Identified   [name], [place] 01/23/2016, 11:57 AM

## 2016-01-24 LAB — URINE CULTURE

## 2016-01-27 ENCOUNTER — Emergency Department (HOSPITAL_COMMUNITY)
Admission: EM | Admit: 2016-01-27 | Discharge: 2016-01-28 | Disposition: A | Discharge status: Skilled Nursing Facility | Payer: Medicare Other | Attending: Emergency Medicine | Admitting: Emergency Medicine

## 2016-01-27 ENCOUNTER — Encounter (HOSPITAL_COMMUNITY): Payer: Self-pay | Admitting: *Deleted

## 2016-01-27 DIAGNOSIS — Z96652 Presence of left artificial knee joint: Secondary | ICD-10-CM | POA: Diagnosis not present

## 2016-01-27 DIAGNOSIS — J449 Chronic obstructive pulmonary disease, unspecified: Secondary | ICD-10-CM | POA: Diagnosis not present

## 2016-01-27 DIAGNOSIS — I509 Heart failure, unspecified: Secondary | ICD-10-CM | POA: Diagnosis not present

## 2016-01-27 DIAGNOSIS — R339 Retention of urine, unspecified: Secondary | ICD-10-CM | POA: Diagnosis present

## 2016-01-27 DIAGNOSIS — Z7982 Long term (current) use of aspirin: Secondary | ICD-10-CM | POA: Diagnosis not present

## 2016-01-27 DIAGNOSIS — M47812 Spondylosis without myelopathy or radiculopathy, cervical region: Secondary | ICD-10-CM | POA: Insufficient documentation

## 2016-01-27 DIAGNOSIS — N189 Chronic kidney disease, unspecified: Secondary | ICD-10-CM | POA: Diagnosis not present

## 2016-01-27 DIAGNOSIS — F209 Schizophrenia, unspecified: Secondary | ICD-10-CM | POA: Insufficient documentation

## 2016-01-27 DIAGNOSIS — F0390 Unspecified dementia without behavioral disturbance: Secondary | ICD-10-CM | POA: Diagnosis not present

## 2016-01-27 DIAGNOSIS — T83010A Breakdown (mechanical) of cystostomy catheter, initial encounter: Secondary | ICD-10-CM

## 2016-01-27 DIAGNOSIS — H409 Unspecified glaucoma: Secondary | ICD-10-CM | POA: Diagnosis not present

## 2016-01-27 DIAGNOSIS — M810 Age-related osteoporosis without current pathological fracture: Secondary | ICD-10-CM | POA: Insufficient documentation

## 2016-01-27 DIAGNOSIS — N390 Urinary tract infection, site not specified: Secondary | ICD-10-CM

## 2016-01-27 DIAGNOSIS — I130 Hypertensive heart and chronic kidney disease with heart failure and stage 1 through stage 4 chronic kidney disease, or unspecified chronic kidney disease: Secondary | ICD-10-CM | POA: Diagnosis not present

## 2016-01-27 DIAGNOSIS — E785 Hyperlipidemia, unspecified: Secondary | ICD-10-CM | POA: Insufficient documentation

## 2016-01-27 DIAGNOSIS — K219 Gastro-esophageal reflux disease without esophagitis: Secondary | ICD-10-CM | POA: Insufficient documentation

## 2016-01-27 DIAGNOSIS — Z853 Personal history of malignant neoplasm of breast: Secondary | ICD-10-CM | POA: Insufficient documentation

## 2016-01-27 DIAGNOSIS — Z79899 Other long term (current) drug therapy: Secondary | ICD-10-CM | POA: Diagnosis not present

## 2016-01-27 DIAGNOSIS — Z8673 Personal history of transient ischemic attack (TIA), and cerebral infarction without residual deficits: Secondary | ICD-10-CM | POA: Insufficient documentation

## 2016-01-27 DIAGNOSIS — Z87891 Personal history of nicotine dependence: Secondary | ICD-10-CM | POA: Insufficient documentation

## 2016-01-27 DIAGNOSIS — M1990 Unspecified osteoarthritis, unspecified site: Secondary | ICD-10-CM | POA: Diagnosis not present

## 2016-01-27 DIAGNOSIS — E119 Type 2 diabetes mellitus without complications: Secondary | ICD-10-CM | POA: Diagnosis not present

## 2016-01-27 DIAGNOSIS — T83031A Leakage of indwelling urethral catheter, initial encounter: Secondary | ICD-10-CM | POA: Insufficient documentation

## 2016-01-27 DIAGNOSIS — Z7951 Long term (current) use of inhaled steroids: Secondary | ICD-10-CM | POA: Diagnosis not present

## 2016-01-27 DIAGNOSIS — Y732 Prosthetic and other implants, materials and accessory gastroenterology and urology devices associated with adverse incidents: Secondary | ICD-10-CM | POA: Diagnosis not present

## 2016-01-27 LAB — CBC WITH DIFFERENTIAL/PLATELET
BASOS ABS: 0 10*3/uL (ref 0.0–0.1)
Basophils Relative: 0 %
Eosinophils Absolute: 0.1 10*3/uL (ref 0.0–0.7)
Eosinophils Relative: 1 %
HEMATOCRIT: 35.1 % — AB (ref 36.0–46.0)
HEMOGLOBIN: 10.8 g/dL — AB (ref 12.0–15.0)
LYMPHS PCT: 22 %
Lymphs Abs: 1.9 10*3/uL (ref 0.7–4.0)
MCH: 26.7 pg (ref 26.0–34.0)
MCHC: 30.8 g/dL (ref 30.0–36.0)
MCV: 86.9 fL (ref 78.0–100.0)
MONO ABS: 0.5 10*3/uL (ref 0.1–1.0)
Monocytes Relative: 6 %
NEUTROS ABS: 6.2 10*3/uL (ref 1.7–7.7)
Neutrophils Relative %: 71 %
Platelets: 194 10*3/uL (ref 150–400)
RBC: 4.04 MIL/uL (ref 3.87–5.11)
RDW: 17.6 % — ABNORMAL HIGH (ref 11.5–15.5)
WBC: 8.7 10*3/uL (ref 4.0–10.5)

## 2016-01-27 LAB — BASIC METABOLIC PANEL
ANION GAP: 10 (ref 5–15)
BUN: 11 mg/dL (ref 4–21)
BUN: 11 mg/dL (ref 6–20)
CHLORIDE: 105 mmol/L (ref 101–111)
CO2: 26 mmol/L (ref 22–32)
Calcium: 10 mg/dL (ref 8.9–10.3)
Creatinine, Ser: 0.72 mg/dL (ref 0.44–1.00)
Creatinine: 0.7 mg/dL (ref 0.5–1.1)
GFR calc Af Amer: >60 mL/min (ref >60)
GFR calc non Af Amer: >60 mL/min (ref >60)
GLUCOSE: 106 mg/dL
GLUCOSE: 106 mg/dL — AB (ref 65–99)
POTASSIUM: 3.8 mmol/L (ref 3.5–5.1)
Sodium: 141 mmol/L (ref 135–145)
Sodium: 141 mmol/L (ref 137–147)

## 2016-01-27 LAB — URINALYSIS, ROUTINE W REFLEX MICROSCOPIC
Bilirubin Urine: NEGATIVE
GLUCOSE, UA: NEGATIVE mg/dL
Ketones, ur: NEGATIVE mg/dL
Nitrite: NEGATIVE
PH: 7 (ref 5.0–8.0)
Protein, ur: >300 mg/dL — AB
Specific Gravity, Urine: 1.019 (ref 1.005–1.030)

## 2016-01-27 LAB — CBC AND DIFFERENTIAL: WBC: 8.7 10^3/mL

## 2016-01-27 LAB — URINE MICROSCOPIC-ADD ON

## 2016-01-27 MED ORDER — CEFPODOXIME PROXETIL 100 MG PO TABS: 100.0000 mg | ORAL_TABLET | Freq: Two times a day (BID) | ORAL | Status: DC

## 2016-01-27 NOTE — Discharge Instructions (Signed)
Continue vantin as prescribed. Please follow up with urology on Monday, pt currently has latex catheter in, and it will need to be changed due to sensitivity by urology. Return if any issues.    Catheter-Associated Urinary Tract Infection FAQs What is "catheter-associated urinary tract infection"? A urinary tract infection (also called "UTI") is an infection in the urinary system, which includes the bladder (which stores the urine) and the kidneys (which filter the blood to make urine). Germs (for example, bacteria or yeasts) do not normally live in these areas; but if germs are introduced, an infection can occur. If you have a urinary catheter, germs can travel along the catheter and cause an infection in your bladder or your kidney; in that case it is called a catheter-associated urinary tract infection (or "CA-UTI").  What is a urinary catheter? A urinary catheter is a thin tube placed in the bladder to drain urine. Urine drains through the tube into a bag that collects the urine. A urinary catheter may be used:  If you are not able to urinate on your own  To measure the amount of urine that you make, for example, during intensive care  During and after some types of surgery  During some tests of the kidneys and bladder People with urinary catheters have a much higher chance of getting a urinary tract infection than people who don't have a catheter. How do I get a catheter-associated urinary tract infection (CA-UTI)? If germs enter the urinary tract, they may cause an infection. Many of the germs that cause a catheter-associated urinary tract infection are common germs found in your intestines that do not usually cause an infection there. Germs can enter the urinary tract when the catheter is being put in or while the catheter remains in the bladder.  What are the symptoms of a urinary tract infection? Some of the common symptoms of a urinary tract infection are:  Burning or pain in the  lower abdomen (that is, below the stomach)  Fever  Bloody urine may be a sign of infection, but is also caused by other problems  Burning during urination or an increase in the frequency of urination after the catheter is removed. Sometimes people with catheter-associated urinary tract infections do not have these symptoms of infection. Can catheter-associated urinary tract infections be treated? Yes, most catheter-associated urinary tract infections can be treated with antibiotics and removal or change of the catheter. Your doctor will determine which antibiotic is best for you.  What are some of the things that hospitals are doing to prevent catheter-associated urinary tract infections? To prevent urinary tract infections, doctors and nurses take the following actions.  Catheter insertion  External catheters in men (these look like condoms and are placed over the penis rather than into the penis)  Putting a temporary catheter in to drain the urine and removing it right away. This is called intermittent urethral catheterization. Catheter care What can I do to help prevent catheter-associated urinary tract infections if I have a catheter?  Always clean your hands before and after doing catheter care.  Always keep your urine bag below the level of your bladder.  Do not tug or pull on the tubing.  Do not twist or kink the catheter tubing.  Ask your healthcare provider each day if you still need the catheter. What do I need to do when I go home from the hospital?  If you will be going home with a catheter, your doctor or nurse should explain  everything you need to know about taking care of the catheter. Make sure you understand how to care for it before you leave the hospital.  If you develop any of the symptoms of a urinary tract infection, such as burning or pain in the lower abdomen, fever, or an increase in the frequency of urination, contact your doctor or nurse  immediately.  Before you go home, make sure you know who to contact if you have questions or problems after you get home. If you have questions, please ask your doctor or nurse. Developed and co-sponsored by [employer] for [employer] [phone]); Infectious Diseases Society of [place] (IDSA); [place]; Association for Professionals in Infection Control and Epidemiology (APIC); Centers for Disease Control and Prevention (CDC); and [employer].   This information is not intended to replace advice given to you by your health care provider. Make sure you discuss any questions you have with your health care provider.   Document Released: 05/27/2012 Document Revised: 01/17/2015 Document Reviewed: 11/16/2014 Elsevier Interactive Patient Education [employer].

## 2016-01-27 NOTE — ED Notes (Signed)
Per EMS, [facility] nurse was unable to irrigate the pt's suprapubic catheter on Friday. Pt has had blood in urine since Friday and reduced urine output. Pt complains of abdominal pain since 3PM today.

## 2016-01-27 NOTE — ED Provider Notes (Signed)
CSN: [id number]     Arrival date & time 01/27/16  1916 History   First MD Initiated Contact with Patient 01/27/16 2003     Chief Complaint  Patient presents with  . Urinary Retention     (Consider location/radiation/quality/duration/timing/severity/associated sxs/prior Treatment) HPI [name] is a 82 y.o. female with multiple medical problems and suprapubic catheter, coming into the emergency department because the catheter has not been flowing in the last 2 days. Patient was recently hospitalized and discharged 4 days ago for urinary tract infection and urinary retention. She finished full course of Vantin. She had a follow-up with urology 3 days ago when her suprapubic catheter was changed. Patient's daughter states that at her skilled nursing facility the catheter was supposed to be irrigated every other day, and when they tried to irrigate it yesterday, the nurse "didn't do it right." Daughter states that since then the Foley was not emptying and patient has complaint of some abdominal pain. Patient has not had any output in the Foley bag since yesterday, and had about 200 mL upon arrival to the emergency department. Daughter states that they tried to irrigate the Foley several times with no results. They also tried to deflate the balloon and removed the Foley around which yielded small amount of urine but stopped flowing afterwards. Pt did have blood urine at time of recent admission, but that cleared up. At present patient denies any abdominal pain, but states she did have some earlier. She denies any flank pain. No fever or chills. No other complaints.   Past Medical History  Diagnosis Date  . Anemia   . Arthritis   . COPD (chronic obstructive pulmonary disease) ([place])   . Chronic schizophrenia ([place])   . Dementia   . Depressive disorder   . Acute sinusitis   . Schizophrenia ([place])   . Osteoporosis   . Stenosis of lumbosacral spine   . Glaucoma   . On home oxygen therapy     2 L PER  NASAL CANNULA  . GERD (gastroesophageal reflux disease)   . Stroke ([facility])     L hemiparesis  . Cervical spondylosis   . DM (diabetes mellitus) ([place])     DIET CONTROLLED  . Chronic kidney disease     chronic kidney disease per MD notes  . CHF (congestive heart failure) (H[facility])     chronic and stable per MD note  . Hyperlipidemia   . Hypertension   . Foley catheter in place   . Wheelchair dependent   . Breast cancer [facility])    Past Surgical History  Procedure Laterality Date  . Appendectomy    . Cataract extraction, bilateral    . Cholecystectomy    . Uterine suspension    . Joint replacement      L TOTAL KNEE  . Breast surgery      breast cancer  . Insertion of suprapubic catheter N/A 04/29/2014    Procedure: INSERTION OF SUPRAPUBIC CATHETER;  Surgeon: Arvil Persons, MD;  Location: [facility] ORS;  Service: Urology;  Laterality: N/A;   Family History  Problem Relation Age of Onset  . Asthma Daughter   . Heart disease Sister     3 sisters  . Heart disease Brother     x 2  . Cancer Brother     multiple myloma   Social History  Substance Use Topics  . Smoking status: Former Smoker -- 1.00 packs/day for 15 years    Types: Cigarettes  Quit date: 09/16/1997  . Smokeless tobacco: None  . Alcohol Use: No   OB History    No data available     Review of Systems  Constitutional: Negative for fever and chills.  Respiratory: Negative for cough, chest tightness and shortness of breath.   Cardiovascular: Negative for chest pain, palpitations and leg swelling.  Gastrointestinal: Positive for abdominal pain. Negative for nausea, vomiting and diarrhea.  Genitourinary: Positive for hematuria and difficulty urinating.  Musculoskeletal: Negative for myalgias, arthralgias, neck pain and neck stiffness.  Skin: Negative for rash.  Neurological: Negative for dizziness, weakness and headaches.  All other systems reviewed and are negative.     Allergies  Lisinopril  Home Medications    Prior to Admission medications   Medication Sig Start Date End Date Taking? Authorizing Provider  acetaminophen (TYLENOL) 500 MG tablet Take 500 mg by mouth 3 (three) times daily.     Historical Provider, MD  albuterol (PROVENTIL) (2.5 MG/3ML) 0.083% nebulizer solution Take 2.5 mg by nebulization 3 (three) times daily.    Historical Provider, MD  amLODipine (NORVASC) 5 MG tablet Take 5 mg by mouth daily.    Historical Provider, MD  aspirin 81 MG chewable tablet Chew 1 tablet (81 mg total) by mouth daily. 01/24/16   Debbe Odea, MD  budesonide (PULMICORT) 0.5 MG/2ML nebulizer solution Take 0.5 mg by nebulization 2 (two) times daily.    Historical Provider, MD  Cholecalciferol (VITAMIN D3) 2000 UNITS TABS Take by mouth daily. For vitamin D deficiency    Historical Provider, MD  cycloSPORINE (RESTASIS) 0.05 % ophthalmic emulsion Place 1 drop into both eyes 2 (two) times daily.     Historical Provider, MD  donepezil (ARICEPT) 10 MG tablet Take 10 mg by mouth at bedtime.     Historical Provider, MD  estradiol (ESTRACE) 0.1 MG/GM vaginal cream Place 1 Applicatorful vaginally 2 (two) times a week.    Historical Provider, MD  FLUPHENAZINE DECANOATE IJ Inject 25 mg/mL as directed every 30 (thirty) days.     Historical Provider, MD  fluticasone (FLONASE) 50 MCG/ACT nasal spray Place 2 sprays into the nose daily. Reported on 11/10/2015    Historical Provider, MD  gabapentin (NEURONTIN) 100 MG capsule Take 100 mg by mouth 2 (two) times daily.    Historical Provider, MD  gabapentin (NEURONTIN) 300 MG capsule Take 300 mg by mouth at bedtime.    Historical Provider, MD  hydrALAZINE (APRESOLINE) 25 MG tablet Take 25 mg by mouth 2 (two) times daily.    Historical Provider, MD  loratadine (CLARITIN) 10 MG tablet Take 10 mg by mouth daily.    Historical Provider, MD  olopatadine (PATANOL) 0.1 % ophthalmic solution Place 1 drop into both eyes 2 (two) times daily.     Historical Provider, MD  omeprazole (PRILOSEC)  20 MG capsule Take 20 mg by mouth daily.    Historical Provider, MD  OXYGEN Inhale 2 L into the lungs.    Historical Provider, MD  polyethylene glycol (MIRALAX / GLYCOLAX) packet Take 17 g by mouth daily.    Historical Provider, MD  Propylene Glycol (SYSTANE BALANCE OP) Apply to eye. 1 drop in both eyes twice a day    Historical Provider, MD  saccharomyces boulardii (FLORASTOR) 250 MG capsule Take 1 capsule (250 mg total) by mouth 2 (two) times daily. 01/23/16 01/29/16  Debbe Odea, MD  sennosides-docusate sodium (SENOKOT-S) 8.6-50 MG tablet Take 2 tablets by mouth 2 (two) times daily. HOLD FOR LOOSE STOOLS  Historical Provider, MD  sertraline (ZOLOFT) 50 MG tablet 1 1/2 by mouth daily for depression    Historical Provider, MD  UNABLE TO FIND Med Name: white vinegar irrigate suprapubic tube every other day with 1/2 sterile water & 1/2 vinegar in the morning (100 cc of each = 200 cc)    Historical Provider, MD  vitamin B-12 (CYANOCOBALAMIN) 500 MCG tablet Take 500 mcg by mouth daily.    Historical Provider, MD  vitamin C (ASCORBIC ACID) 500 MG tablet Take 500 mg by mouth daily. Supplement    Historical Provider, MD   BP 128/76 mmHg  Pulse 73  Temp(Src) 98.3 F (36.8 C) (Oral)  Resp 16  SpO2 100% Physical Exam  Constitutional: She appears well-developed and well-nourished. No distress.  HENT:  Head: Normocephalic.  Eyes: Conjunctivae are normal.  Neck: Neck supple.  Cardiovascular: Normal rate, regular rhythm and normal heart sounds.   Pulmonary/Chest: Effort normal and breath sounds normal. No respiratory distress. She has no wheezes. She has no rales.  Abdominal: Soft. Bowel sounds are normal. She exhibits no distension. There is no tenderness. There is no rebound.  Suprapubic catheter in place  Musculoskeletal: She exhibits no edema.  Neurological: She is alert.  Skin: Skin is warm and dry.  Psychiatric: She has a normal mood and affect. Her behavior is normal.  Nursing note and  vitals reviewed.   ED Course  ASPIRATION BLADDER INSERT SUPRAPUBIC CATHETER Date/Time: 01/28/2016 5:14 AM Performed by: Jeannett Senior Authorized by: Jeannett Senior Consent: Verbal consent obtained. Consent given by: patient and power of attorney Patient understanding: patient states understanding of the procedure being performed Patient identity confirmed: verbally with patient Local anesthesia used: no Patient sedated: no Patient tolerance: Patient tolerated the procedure well with no immediate complications   (including critical care time) Labs Review Labs Reviewed  CBC WITH DIFFERENTIAL/PLATELET  BASIC METABOLIC PANEL  URINALYSIS, ROUTINE W REFLEX MICROSCOPIC (NOT AT [facility])    Imaging Review No results found. I have personally reviewed and evaluated these images and lab results as part of my medical decision-making.   EKG Interpretation None        MDM   Final diagnoses:  UTI (lower urinary tract infection)  Suprapubic catheter dysfunction, initial encounter ([place])   Pt with recent admission for UTI and urinary retention. Finished vantin. Here bc suprapubic cath wont flush. 200CC of urine in the lat 24 hrs. Will get bladder scanner, labs, UA  9:11 PM Bladder scanner showed 16 mL in the bladder. Nurse irrigated the bladder, states he pushed 60 mL of saline with no difficulty, but only got around 20 mL back. Patient also has had some leakage of urine through her urethra. Foley is actively draining. Pending labs and urinalysis.  10:54 PM Urinalysis continues to show infection. Question colonization, patient has had a catheter in for 2 years. I send repeat cultures. Patient is afebrile here, normal vital signs, normal labs, no evidence of sepsis.   While in ED, pts catheter came out and had no inflated balloon on it. We replaced it with 31f latex catheter. We were unable to find silicon catheter in emergency department this time. Urine flow is good. I discussed  with Dr. Ralene Bathe. Bedside ultrasound did not show extended bladder. Plan to restart vantin and have her follow-up with urology in 2 days. Return precautions discussed.   Filed Vitals:   01/27/16 2000 01/27/16 2200 01/27/16 2230 01/28/16 0108  BP: 114/72 122/61 105/74 110/72  Pulse: 71 72 72  73  Temp:      TempSrc:      Resp: 14 15 13 14   SpO2: 99% 100% 100% 100%     Jeannett Senior, PA-C 01/28/16 0515  Quintella Reichert, MD 01/28/16 1553

## 2016-01-28 NOTE — ED Notes (Signed)
Family at bedside awaiting transportation of pt back to nursing home.

## 2016-01-28 NOTE — ED Notes (Signed)
[facility] here for transport of pt back to nursing home.

## 2016-01-29 ENCOUNTER — Encounter: Payer: Self-pay | Admitting: Internal Medicine

## 2016-01-29 ENCOUNTER — Non-Acute Institutional Stay (SKILLED_NURSING_FACILITY): Payer: Medicare Other | Admitting: Internal Medicine

## 2016-01-29 DIAGNOSIS — J9611 Chronic respiratory failure with hypoxia: Secondary | ICD-10-CM

## 2016-01-29 DIAGNOSIS — E1140 Type 2 diabetes mellitus with diabetic neuropathy, unspecified: Secondary | ICD-10-CM

## 2016-01-29 DIAGNOSIS — N390 Urinary tract infection, site not specified: Secondary | ICD-10-CM | POA: Diagnosis not present

## 2016-01-29 DIAGNOSIS — I10 Essential (primary) hypertension: Secondary | ICD-10-CM | POA: Diagnosis not present

## 2016-01-29 DIAGNOSIS — F0390 Unspecified dementia without behavioral disturbance: Secondary | ICD-10-CM | POA: Diagnosis not present

## 2016-01-29 DIAGNOSIS — I5022 Chronic systolic (congestive) heart failure: Secondary | ICD-10-CM

## 2016-01-29 DIAGNOSIS — B9620 Unspecified Escherichia coli [E. coli] as the cause of diseases classified elsewhere: Secondary | ICD-10-CM | POA: Diagnosis not present

## 2016-01-29 DIAGNOSIS — N319 Neuromuscular dysfunction of bladder, unspecified: Secondary | ICD-10-CM

## 2016-01-30 LAB — URINE CULTURE: Culture: 30000 — AB

## 2016-01-31 ENCOUNTER — Telehealth: Payer: Self-pay | Admitting: *Deleted

## 2016-01-31 NOTE — Progress Notes (Signed)
ED Antimicrobial Stewardship Positive Culture Follow Up   [name] is an 82 y.o. female who presented to [facility] on 01/27/2016 with a chief complaint of  Chief Complaint  Patient presents with  . Urinary Retention    Recent Results (from the past 720 hour(s))  Urine culture     Status: Abnormal   Collection Time: 01/20/16  8:00 PM  Result Value Ref Range Status   Specimen Description URINE, SUPRAPUBIC  Final   Special Requests NONE  Final   Culture (A)  Final    >=100,000 COLONIES/mL PROVIDENCIA STUARTII >=100,000 COLONIES/mL PSEUDOMONAS AERUGINOSA 50,000 COLONIES/mL ESCHERICHIA COLI 50,000 COLONIES/mL METHICILLIN RESISTANT STAPHYLOCOCCUS AUREUS    Report Status 01/24/2016 FINAL  Final   Organism ID, Bacteria PROVIDENCIA STUARTII (A)  Final   Organism ID, Bacteria PSEUDOMONAS AERUGINOSA (A)  Final   Organism ID, Bacteria ESCHERICHIA COLI (A)  Final   Organism ID, Bacteria METHICILLIN RESISTANT STAPHYLOCOCCUS AUREUS (A)  Final      Susceptibility   Escherichia coli - MIC*    AMPICILLIN 16 INTERMEDIATE Intermediate     CEFAZOLIN <=4 SENSITIVE Sensitive     CEFTRIAXONE <=1 SENSITIVE Sensitive     CIPROFLOXACIN >=4 RESISTANT Resistant     GENTAMICIN <=1 SENSITIVE Sensitive     IMIPENEM <=0.25 SENSITIVE Sensitive     NITROFURANTOIN <=16 SENSITIVE Sensitive     TRIMETH/SULFA <=20 SENSITIVE Sensitive     AMPICILLIN/SULBACTAM 4 INTERMEDIATE Intermediate     PIP/TAZO <=4 SENSITIVE Sensitive     * 50,000 COLONIES/mL ESCHERICHIA COLI   Pseudomonas aeruginosa - MIC*    CEFTAZIDIME 4 SENSITIVE Sensitive     CIPROFLOXACIN <=0.25 SENSITIVE Sensitive     GENTAMICIN <=1 SENSITIVE Sensitive     IMIPENEM 1 SENSITIVE Sensitive     PIP/TAZO 8 SENSITIVE Sensitive     CEFEPIME 2 SENSITIVE Sensitive     * >=100,000 COLONIES/mL PSEUDOMONAS AERUGINOSA   Methicillin resistant staphylococcus aureus - MIC*    CIPROFLOXACIN >=8 RESISTANT Resistant     GENTAMICIN <=0.5 SENSITIVE Sensitive    NITROFURANTOIN <=16 SENSITIVE Sensitive     OXACILLIN >=4 RESISTANT Resistant     TETRACYCLINE <=1 SENSITIVE Sensitive     VANCOMYCIN 1 SENSITIVE Sensitive     TRIMETH/SULFA <=10 SENSITIVE Sensitive     CLINDAMYCIN <=0.25 SENSITIVE Sensitive     RIFAMPIN <=0.5 SENSITIVE Sensitive     Inducible Clindamycin NEGATIVE Sensitive     * 50,000 COLONIES/mL METHICILLIN RESISTANT STAPHYLOCOCCUS AUREUS   Providencia stuartii - MIC*    AMPICILLIN >=32 RESISTANT Resistant     CEFAZOLIN >=64 RESISTANT Resistant     CEFTRIAXONE <=1 SENSITIVE Sensitive     CIPROFLOXACIN >=4 RESISTANT Resistant     GENTAMICIN 8 RESISTANT Resistant     IMIPENEM 1 SENSITIVE Sensitive     NITROFURANTOIN 128 RESISTANT Resistant     TRIMETH/SULFA <=20 SENSITIVE Sensitive     AMPICILLIN/SULBACTAM 16 INTERMEDIATE Intermediate     PIP/TAZO <=4 SENSITIVE Sensitive     * >=100,000 COLONIES/mL PROVIDENCIA STUARTII  MRSA PCR Screening     Status: None   Collection Time: 01/21/16  2:24 AM  Result Value Ref Range Status   MRSA by PCR NEGATIVE NEGATIVE Final    Comment:        The GeneXpert MRSA Assay (FDA approved for NASAL specimens only), is one component of a comprehensive MRSA colonization surveillance program. It is not intended to diagnose MRSA infection nor to guide or monitor treatment for MRSA infections.  Urine culture     Status: Abnormal   Collection Time: 01/27/16  8:52 PM  Result Value Ref Range Status   Specimen Description URINE, CATHETERIZED  Final   Special Requests NONE  Final   Culture 30,000 COLONIES/mL PSEUDOMONAS AERUGINOSA (A)  Final   Report Status 01/30/2016 FINAL  Final   Organism ID, Bacteria PSEUDOMONAS AERUGINOSA (A)  Final      Susceptibility   Pseudomonas aeruginosa - MIC*    CEFTAZIDIME 4 SENSITIVE Sensitive     CIPROFLOXACIN <=0.25 SENSITIVE Sensitive     GENTAMICIN <=1 SENSITIVE Sensitive     IMIPENEM <=0.25 SENSITIVE Sensitive     PIP/TAZO 8 SENSITIVE Sensitive     CEFEPIME 2  SENSITIVE Sensitive     * 30,000 COLONIES/mL PSEUDOMONAS AERUGINOSA    [x]  Treated with cefpodoxime, organism resistant to prescribed antimicrobial  New antibiotic prescription: Fosfomycin 3 gm PO x 1  ED Provider: Domenic Moras, PA-C  Airica Schwartzkopf L. Nicole Kindred, PharmD PGY2 Infectious Diseases Pharmacy Resident Pager: [phone] 01/31/2016 9:26 AM

## 2016-01-31 NOTE — ED Notes (Unsigned)
Post ED Visit - Positive Culture Follow-up: Successful Patient Follow-Up  Culture assessed and recommendations reviewed by: []  Elenor Quinones, Pharm.D. []  Heide Guile, Pharm.D., BCPS []  Parks Neptune, Pharm.D. []  Alycia Rossetti, Pharm.D., BCPS []  [place], Pharm.D., BCPS, AAHIVP []  Legrand Como, Pharm.D., BCPS, AAHIVP []  Milus Glazier, Pharm.D. []  Stephens November, Pharm.D.  Positive *** culture  []  Patient discharged without antimicrobial prescription and treatment is now indicated [x]  Organism is resistant to prescribed ED discharge antimicrobial []  Patient with positive blood cultures  Changes discussed with ED provider: Domenic Moras, PA-C who recommends Fosfomaycin 3Gm PO x 1.  Spoke with daughter of patient who state patient was seen by [facility] on 01/29/16 and is being followed by Dr. Leonides Schanz for her UTI.   Contacted daughter of patient, date 01/31/2016, time [place] 01/31/2016, 10:22 AM

## 2016-02-06 NOTE — Progress Notes (Signed)
Patient ID: [name], female   DOB: [birth year], 82 y.o.   MRN: [id number]  Provider:   Location:  [facility] Room Number: [id number] Place of Service:  SNF (31)  PCP: Blanchie Serve, MD Patient Care Team: Blanchie Serve, MD as PCP - General (Internal Medicine)  Extended Emergency Contact Information Primary Emergency Contact: Busser-Dean,Sharone Address: [place], [place] [address] Johnnette Litter of [place] Phone: [phone] Mobile Phone: [phone] Relation: Daughter Secondary Emergency Contact: Bullock,Janet Address: [address]          [place],  [address] Johnnette Litter of [place] Phone: [phone] Mobile Phone: [phone] Relation: Daughter  Code Status: full Goals of Care: Advanced Directive information Advanced Directives 01/27/2016  Does patient have an advance directive? No  Would patient like information on creating an advanced directive? No - patient declined information      Chief Complaint  Patient presents with  . Readmit To SNF    Readmission    HPI: Patient is a 82 y.o. female seen today for admission to [facility] 01/23/2016. Patient was initially admitted on 12/23/2013. She is a long-term care patient. She was hospitalized between 01/20/2016 and 01/23/2016 with hematuria that was ultimately determined to be hemorrhagic cystitis. Cystitis is a recurrent problem with this patient. Patient has a known urinary retention/neurogenic bladder. Other issues include COPD, schizophrenia, diabetes mellitus, vascular dementia, congestive heart failure with hypoxia, hypertension, depression.  Patient appears to be back at baseline status today. Although rehabilitative measures will be instituted with physical therapy and occupational therapy. I think there is little chance that she is going to improve much.  Past Medical History  Diagnosis Date  . Anemia   . Arthritis   . COPD (chronic obstructive  pulmonary disease) ([place])   . Chronic schizophrenia ([place])   . Dementia   . Depressive disorder   . Acute sinusitis   . Schizophrenia ([place])   . Osteoporosis   . Stenosis of lumbosacral spine   . Glaucoma   . On home oxygen therapy     2 L PER NASAL CANNULA  . GERD (gastroesophageal reflux disease)   . Stroke (HCC)     L hemiparesis  . Cervical spondylosis   . DM (diabetes mellitus) ([place])     DIET CONTROLLED  . Chronic kidney disease     chronic kidney disease per MD notes  . CHF (congestive heart failure) (HCC)     chronic and stable per MD note  . Hyperlipidemia   . Hypertension   . Foley catheter in place   . Wheelchair dependent   . Breast cancer [facility])    Past Surgical History  Procedure Laterality Date  . Appendectomy    . Cataract extraction, bilateral    . Cholecystectomy    . Uterine suspension    . Joint replacement      L TOTAL KNEE  . Breast surgery      breast cancer  . Insertion of suprapubic catheter N/A 04/29/2014    Procedure: INSERTION OF SUPRAPUBIC CATHETER;  Surgeon: Arvil Persons, MD;  Location: [facility];  Service: Urology;  Laterality: N/A;    reports that she quit smoking about 18 years ago. Her smoking use included Cigarettes. She has a 15 pack-year smoking history. She does not have any smokeless tobacco history on file. She reports that she does not drink alcohol or use illicit drugs.  Social History   Social History  . Marital Status: Single    Spouse Name: N/A  . Number of Children: N/A  . Years of Education: N/A   Occupational History  . Not on file.   Social History Main Topics  . Smoking status: Former Smoker -- 1.00 packs/day for 15 years    Types: Cigarettes    Quit date: 09/16/1997  . Smokeless tobacco: Not on file  . Alcohol Use: No  . Drug Use: No  . Sexual Activity: No   Other Topics Concern  . Not on file   Social History Narrative    Functional Status Survey:    Family History  Problem Relation Age of Onset  . Asthma  Daughter   . Heart disease Sister     3 sisters  . Heart disease Brother     x 2  . Cancer Brother     multiple myloma    Health Maintenance  Topic Date Due  . PNA vac Low Risk Adult (2 of 2 - PCV13) 09/16/2016 (Originally 09/17/2003)  . DEXA SCAN  09/16/2018 (Originally 12/04/1992)  . ZOSTAVAX  09/16/2018 (Originally 12/05/1987)  . TETANUS/TDAP  09/16/2018 (Originally 12/05/1946)  . INFLUENZA VACCINE  04/16/2016  . URINE MICROALBUMIN  07/17/2016  . HEMOGLOBIN A1C  07/23/2016  . FOOT EXAM  10/03/2016  . OPHTHALMOLOGY EXAM  10/10/2016    Allergies  Allergen Reactions  . Lisinopril     Per MAR leg weakness      Medication List       This list is accurate as of: 01/29/16 11:59 PM.  Always use your most recent med list.               acetaminophen 500 MG tablet  Commonly known as:  TYLENOL  Take 500 mg by mouth 3 (three) times daily.     albuterol (2.5 MG/3ML) 0.083% nebulizer solution  Commonly known as:  PROVENTIL  Take 2.5 mg by nebulization 3 (three) times daily.     amLODipine 5 MG tablet  Commonly known as:  NORVASC  Take 5 mg by mouth daily.     aspirin 81 MG chewable tablet  Chew 1 tablet (81 mg total) by mouth daily.     budesonide 0.5 MG/2ML nebulizer solution  Commonly known as:  PULMICORT  Take 0.5 mg by nebulization 2 (two) times daily.     cefpodoxime 100 MG tablet  Commonly known as:  VANTIN  Take 1 tablet (100 mg total) by mouth 2 (two) times daily.     cycloSPORINE 0.05 % ophthalmic emulsion  Commonly known as:  RESTASIS  Place 1 drop into both eyes 2 (two) times daily.     donepezil 10 MG tablet  Commonly known as:  ARICEPT  Take 10 mg by mouth at bedtime.     estradiol 0.1 MG/GM vaginal cream  Commonly known as:  ESTRACE  Place 1 Applicatorful vaginally 2 (two) times a week.     FLUPHENAZINE DECANOATE IJ  Inject 25 mg/mL as directed every 30 (thirty) days.     fluticasone 50 MCG/ACT nasal spray  Commonly known as:  FLONASE  Place  2 sprays into the nose daily. Reported on 11/10/2015     gabapentin 300 MG capsule  Commonly known as:  NEURONTIN  Take 300 mg by mouth at bedtime.     gabapentin 100 MG capsule  Commonly known as:  NEURONTIN  Take 100 mg by mouth 2 (two) times daily.  hydrALAZINE 25 MG tablet  Commonly known as:  APRESOLINE  Take 25 mg by mouth 2 (two) times daily.     loratadine 10 MG tablet  Commonly known as:  CLARITIN  Take 10 mg by mouth daily.     olopatadine 0.1 % ophthalmic solution  Commonly known as:  PATANOL  Place 1 drop into both eyes 2 (two) times daily.     omeprazole 20 MG capsule  Commonly known as:  PRILOSEC  Take 20 mg by mouth daily.     OXYGEN  Inhale 2 L into the lungs.     polyethylene glycol packet  Commonly known as:  MIRALAX / GLYCOLAX  Take 17 g by mouth daily.     saccharomyces boulardii 250 MG capsule  Commonly known as:  FLORASTOR  Take 1 capsule (250 mg total) by mouth 2 (two) times daily.     sennosides-docusate sodium 8.6-50 MG tablet  Commonly known as:  SENOKOT-S  Take 2 tablets by mouth 2 (two) times daily. HOLD FOR LOOSE STOOLS     sertraline 50 MG tablet  Commonly known as:  ZOLOFT  1 1/2 by mouth daily for depression     SYSTANE BALANCE OP  Apply to eye. 1 drop in both eyes twice a day     UNABLE TO FIND  Med Name: white vinegar irrigate suprapubic tube every other day with 1/2 sterile water & 1/2 vinegar in the morning (100 cc of each = 200 cc)     vitamin B-12 500 MCG tablet  Commonly known as:  CYANOCOBALAMIN  Take 500 mcg by mouth daily.     vitamin C 500 MG tablet  Commonly known as:  ASCORBIC ACID  Take 500 mg by mouth daily. Supplement     Vitamin D3 2000 units Tabs  Take by mouth daily. For vitamin D deficiency        Review of Systems  Constitutional: Negative for fever, chills, diaphoresis, activity change, appetite change, fatigue and unexpected weight change.       [place] Kitchen  HENT: Negative for congestion, ear discharge,  ear pain, hearing loss, postnasal drip, rhinorrhea, sore throat, tinnitus, trouble swallowing and voice change.   Eyes: Negative for pain, redness, itching and visual disturbance.  Respiratory: Negative for cough, choking, shortness of breath and wheezing.        Oxygen dependent secondary COPD and congestive heart failure..  Cardiovascular: Positive for leg swelling. Negative for chest pain and palpitations.       History congestive heart failure  Gastrointestinal: Negative for nausea, abdominal pain, diarrhea, constipation and abdominal distention.  Endocrine: Negative for cold intolerance, heat intolerance, polydipsia, polyphagia and polyuria.  Genitourinary: Negative for dysuria, urgency, frequency, hematuria, flank pain, vaginal discharge, difficulty urinating and pelvic pain.       History neurogenic bladder and urinary retention.  Musculoskeletal: Negative for myalgias, back pain, arthralgias, gait problem, neck pain and neck stiffness.  Skin: Negative for color change, pallor and rash.  Allergic/Immunologic: Negative.   Neurological: Negative for dizziness, tremors, seizures, syncope, weakness, numbness and headaches.       Severe dementia attributed to both vascular dementia and possible Alzheimer's disease.  Hematological: Negative for adenopathy. Does not bruise/bleed easily.  Psychiatric/Behavioral: Positive for confusion and dysphoric mood. Negative for suicidal ideas, hallucinations, behavioral problems, sleep disturbance and agitation. The patient is not nervous/anxious and is not hyperactive.        History of schizophrenia    Filed Vitals:   01/29/16 1548  Pulse: 77  Height: 5\' 9"  (1.753 m)  Weight: 213 lb 8 oz (96.843 kg)  SpO2: 95%   Body mass index is 31.51 kg/(m^2). Physical Exam  Constitutional: She is oriented to person, place, and time. She appears well-developed and well-nourished. No distress.  HENT:  Right Ear: External ear normal.  Left Ear: External ear  normal.  Nose: Nose normal.  Mouth/Throat: Oropharynx is clear and moist. No oropharyngeal exudate.  Eyes: Conjunctivae and EOM are normal. Pupils are equal, round, and reactive to light. No scleral icterus.  Neck: No JVD present. No tracheal deviation present. No thyromegaly present.  Cardiovascular: Normal rate, regular rhythm, normal heart sounds and intact distal pulses.  Exam reveals no gallop and no friction rub.   No murmur heard. Pulmonary/Chest: Effort normal. No respiratory distress. She has no wheezes. She has no rales. She exhibits no tenderness.  Dependent on oxygen.  Abdominal: She exhibits no distension and no mass. There is no tenderness.  Genitourinary:  Suprapubic catheter for neurogenic bladder.  Musculoskeletal: Normal range of motion. She exhibits edema (1-2+ bipedal edema). She exhibits no tenderness.  Lymphadenopathy:    She has no cervical adenopathy.  Neurological: She is alert and oriented to person, place, and time. No cranial nerve deficit. Coordination normal.  Drowsy. Poorly responsive.  Skin: No rash noted. She is not diaphoretic. No erythema. No pallor.  Psychiatric: She has a normal mood and affect. Her behavior is normal. Judgment and thought content normal.    Labs reviewed: Basic Metabolic Panel:  Recent Labs  01/21/16 0428 01/22/16 0413 01/23/16 0433 01/27/16 01/27/16 2028  NA 143 142 142 141 141  K 3.2* 3.8 4.1  --  3.8  CL 107 109 109  --  105  CO2 27 24 25   --  26  GLUCOSE 115* 106* 106*  --  106*  BUN 8 13 11 11 11   CREATININE 0.59 0.78 0.68 0.7 0.72  CALCIUM 9.8 9.7 9.8  --  10.0  MG 1.7  --   --   --   --   PHOS 3.0  --   --   --   --    Liver Function Tests:  Recent Labs  12/25/15 01/20/16 1900 01/21/16 0428  AST 12* 21 14*  ALT 9 12* 12*  ALKPHOS 96 85 82  BILITOT  --  0.4 0.5  PROT  --  6.8 6.8  ALBUMIN  --  3.5 3.6   No results for input(s): LIPASE, AMYLASE in the last 8760 hours. No results for input(s): AMMONIA in  the last 8760 hours. CBC:  Recent Labs  01/20/16 1900  01/22/16 0413 01/23/16 0433 01/27/16 01/27/16 2028  WBC 6.5  < > 6.8 6.7 8.7 8.7  NEUTROABS 3.7  --   --   --   --  6.2  HGB 11.2*  < > 10.3* 10.1*  --  10.8*  HCT 35.3*  < > 33.5* 32.6*  --  35.1*  MCV 83.5  < > 86.1 86.7  --  86.9  PLT 162  < > 176 168  --  194  < > = values in this interval not displayed. Cardiac Enzymes: No results for input(s): CKTOTAL, CKMB, CKMBINDEX, TROPONINI in the last 8760 hours. BNP: Invalid input(s): POCBNP Lab Results  Component Value Date   HGBA1C 6.0* 01/21/2016   Lab Results  Component Value Date   TSH 1.725 01/21/2016   No results found for: VITAMINB12 No results found for: FOLATE No  results found for: IRON, TIBC, FERRITIN  Imaging and Procedures obtained prior to SNF admission: No results found.  Assessment/Plan  1. Chronic respiratory failure with hypoxia (HCC) Continue to monitor oxygen needs and oxygen saturations  2. Chronic systolic heart failure (HCC) Appears compensated at present  3. Dementia, without behavioral disturbance Attributed to vascular dementia and possible Alzheimer's disease. Patient is currently using donepezil.  4. E-coli UTI Associated with recent hemorrhagic cystitis  5. Essential hypertension, benign Controlled  6. Neurogenic dysfunction of the urinary bladder Chronic indwelling suprapubic catheter  7. Type 2 diabetes mellitus with diabetic neuropathy, without long-term current use of insulin ([place]) Follow-up lab. Continue current medication.

## 2016-02-08 ENCOUNTER — Encounter: Payer: Self-pay | Admitting: Internal Medicine

## 2016-02-08 NOTE — Progress Notes (Deleted)
Patient ID: [name], female   DOB: Feb 01, 1926, 82 y.o.   MRN: [id number]     [facility]  Code Status: Full Code   Chief Complaint  Patient presents with  . Medical Management of Chronic Issues    Routine Visit   Allergies reviewed: lisinopril   HPI 82 y/o female patient is seen today for routine visit. She denies any concern this visit. No new concern from nursing staff. No fall reported. No new skin concern. Her suprapubic catheter is draining well. No behavioral concerns. BP reading stable. She was treated with a brief course of prednisone for possible copd exacerbation and started on claritin for allergies. She was started on norvasc for BP and readings are stable.    Review of Systems  : limited Constitutional: Negative for fever, chills.  Eyes: Negative for blurred vision   Respiratory: Negative for cough, shortness of breath and wheezing.    Cardiovascular: Negative for chest pain, palpitations.   Gastrointestinal: Negative for heartburn, nausea, vomiting, abdominal pain   Genitourinary: has suprapubic catheter   Past medical history reviewed  Medication reviewed.    Medication List       This list is accurate as of: 02/08/16  3:46 PM.  Always use your most recent med list.               acetaminophen 500 MG tablet  Commonly known as:  TYLENOL  Take 500 mg by mouth 3 (three) times daily.     albuterol (2.5 MG/3ML) 0.083% nebulizer solution  Commonly known as:  PROVENTIL  Take 2.5 mg by nebulization 3 (three) times daily.     amLODipine 5 MG tablet  Commonly known as:  NORVASC  Take 5 mg by mouth daily.     budesonide 0.5 MG/2ML nebulizer solution  Commonly known as:  PULMICORT  Take 0.5 mg by nebulization 2 (two) times daily.     ciprofloxacin 250 MG tablet  Commonly known as:  CIPRO  Take 250 mg by mouth daily.     cycloSPORINE 0.05 % ophthalmic emulsion  Commonly known as:  RESTASIS  Place 1 drop into both eyes 2 (two) times  daily.     donepezil 10 MG tablet  Commonly known as:  ARICEPT  Take 10 mg by mouth at bedtime.     estradiol 0.1 MG/GM vaginal cream  Commonly known as:  ESTRACE  Place 1 Applicatorful vaginally 2 (two) times a week.     FLUPHENAZINE DECANOATE IJ  Inject 25 mg/mL as directed every 30 (thirty) days.     fluticasone 50 MCG/ACT nasal spray  Commonly known as:  FLONASE  Place 2 sprays into the nose daily. Reported on 11/10/2015     gabapentin 300 MG capsule  Commonly known as:  NEURONTIN  Take 300 mg by mouth at bedtime.     gabapentin 100 MG capsule  Commonly known as:  NEURONTIN  Take 100 mg by mouth 2 (two) times daily.     hydrALAZINE 25 MG tablet  Commonly known as:  APRESOLINE  Take 25 mg by mouth 2 (two) times daily.     loratadine 10 MG tablet  Commonly known as:  CLARITIN  Take 10 mg by mouth daily.     olopatadine 0.1 % ophthalmic solution  Commonly known as:  PATANOL  Place 1 drop into both eyes 2 (two) times daily.     omeprazole 20 MG capsule  Commonly known as:  PRILOSEC  Take  20 mg by mouth daily.     OXYGEN  Inhale 2 L into the lungs.     polyethylene glycol packet  Commonly known as:  MIRALAX / GLYCOLAX  Take 17 g by mouth daily.     sennosides-docusate sodium 8.6-50 MG tablet  Commonly known as:  SENOKOT-S  Take 2 tablets by mouth 2 (two) times daily. HOLD FOR LOOSE STOOLS     sertraline 50 MG tablet  Commonly known as:  ZOLOFT  1 1/2 by mouth daily for depression     SYSTANE BALANCE OP  Apply to eye. 1 drop in both eyes twice a day     UNABLE TO FIND  Med Name: white vinegar irrigate suprapubic tube every other day with 1/2 sterile water & 1/2 vinegar in the morning (100 cc of each = 200 cc)     vitamin B-12 500 MCG tablet  Commonly known as:  CYANOCOBALAMIN  Take 500 mcg by mouth daily.     vitamin C 500 MG tablet  Commonly known as:  ASCORBIC ACID  Take 500 mg by mouth daily. Supplement     Vitamin D3 2000 units Tabs  Take by  mouth daily. For vitamin D deficiency        Physical exam BP 125/85 mmHg  Pulse 82  Temp(Src) 97.8 F (36.6 C) (Oral)  Resp 20  Ht 5\' 9"  (1.753 m)  Wt 211 lb 12.8 oz (96.072 kg)  BMI 31.26 kg/m2  SpO2 99%   Wt Readings from Last 3 Encounters:  02/08/16 211 lb 12.8 oz (96.072 kg)  01/29/16 213 lb 8 oz (96.843 kg)  01/21/16 209 lb 14.4 oz (95.21 kg)   General- elderly obese female in no acute distress Head- atraumatic, normocephalic Eyes- no pallor, no icterus, no discharge Cardiovascular- normal s1,s2, no murmurs, trace leg edema Respiratory- bilateral clear to auscultation, no wheeze, no rhonchi, no crackles Abdomen- bowel sounds present, soft, non tender, suprapubic catheter in place and site clean Musculoskeletal- left hemiplegia with left hand contracted, left 4th and 5th finger contracted, has a splint in place, arthritis changes in digits, hoyer transfer, diminished pedal pulses Neurological- alert and oriented Psychiatry- normal mood and affect  Labs   CBC Latest Ref Rng 01/27/2016 01/27/2016 01/23/2016  WBC 4.0 - 10.5 K/uL 8.7 8.7 6.7  Hemoglobin 12.0 - 15.0 g/dL 10.8(L) - 10.1(L)  Hematocrit 36.0 - 46.0 % 35.1(L) - 32.6(L)  Platelets 150 - 400 K/uL 194 - 168   CMP Latest Ref Rng 01/27/2016 01/27/2016 01/23/2016  Glucose 65 - 99 mg/dL 106(H) - 106(H)  BUN 6 - 20 mg/dL 11 11 11   Creatinine 0.44 - 1.00 mg/dL 0.72 0.7 0.68  Sodium 135 - 145 mmol/L 141 141 142  Potassium 3.5 - 5.1 mmol/L 3.8 - 4.1  Chloride 101 - 111 mmol/L 105 - 109  CO2 22 - 32 mmol/L 26 - 25  Calcium 8.9 - 10.3 mg/dL 10.0 - 9.8  Total Protein 6.5 - 8.1 g/dL - - -  Total Bilirubin 0.3 - 1.2 mg/dL - - -  Alkaline Phos 38 - 126 U/L - - -  AST 15 - 41 U/L - - -  ALT 14 - 54 U/L - - -   Lab Results  Component Value Date   HGBA1C 6.0* 01/21/2016    Assessment/plan  HTN Stable bp reading. Continue norvasc 5 mg daily, hydralazine 25 mg bid and monitor BP  Allergic rhinitis Continue claritin  with flonase and pataday eye drops  Copd  exacerbation Improved breathing, completed her prednisone course. Continue budosenide and prn albuterol  Chronic respiratory failure Continue o2 and bronchodilator treatment  b12 def Continue b12 supplement  DM with neuropathy a1c reviewed. Currently off all diabetic medication a1c goal < 7. Continue gabapentin    Blanchie Serve, MD  [facility] [phone] (Monday-Friday 8 am - 5 pm) [phone] (afterhours)

## 2016-02-08 NOTE — Progress Notes (Signed)
This encounter was created in error - please disregard.

## 2016-02-17 ENCOUNTER — Emergency Department (HOSPITAL_COMMUNITY): Payer: Medicare Other

## 2016-02-17 ENCOUNTER — Encounter (HOSPITAL_COMMUNITY): Payer: Self-pay

## 2016-02-17 ENCOUNTER — Emergency Department (HOSPITAL_COMMUNITY)
Admission: EM | Admit: 2016-02-17 | Discharge: 2016-02-18 | Disposition: A | Discharge status: Home / Self Care | Payer: Medicare Other | Attending: Emergency Medicine | Admitting: Emergency Medicine

## 2016-02-17 DIAGNOSIS — N3289 Other specified disorders of bladder: Secondary | ICD-10-CM

## 2016-02-17 DIAGNOSIS — Z96652 Presence of left artificial knee joint: Secondary | ICD-10-CM | POA: Insufficient documentation

## 2016-02-17 DIAGNOSIS — I130 Hypertensive heart and chronic kidney disease with heart failure and stage 1 through stage 4 chronic kidney disease, or unspecified chronic kidney disease: Secondary | ICD-10-CM | POA: Diagnosis not present

## 2016-02-17 DIAGNOSIS — Z79899 Other long term (current) drug therapy: Secondary | ICD-10-CM | POA: Insufficient documentation

## 2016-02-17 DIAGNOSIS — N390 Urinary tract infection, site not specified: Secondary | ICD-10-CM | POA: Diagnosis present

## 2016-02-17 DIAGNOSIS — Z7982 Long term (current) use of aspirin: Secondary | ICD-10-CM | POA: Diagnosis not present

## 2016-02-17 DIAGNOSIS — Z87891 Personal history of nicotine dependence: Secondary | ICD-10-CM | POA: Diagnosis not present

## 2016-02-17 DIAGNOSIS — Z853 Personal history of malignant neoplasm of breast: Secondary | ICD-10-CM | POA: Diagnosis not present

## 2016-02-17 DIAGNOSIS — Z8673 Personal history of transient ischemic attack (TIA), and cerebral infarction without residual deficits: Secondary | ICD-10-CM | POA: Insufficient documentation

## 2016-02-17 DIAGNOSIS — J449 Chronic obstructive pulmonary disease, unspecified: Secondary | ICD-10-CM | POA: Diagnosis not present

## 2016-02-17 DIAGNOSIS — E1122 Type 2 diabetes mellitus with diabetic chronic kidney disease: Secondary | ICD-10-CM | POA: Diagnosis not present

## 2016-02-17 DIAGNOSIS — N189 Chronic kidney disease, unspecified: Secondary | ICD-10-CM | POA: Diagnosis not present

## 2016-02-17 DIAGNOSIS — I509 Heart failure, unspecified: Secondary | ICD-10-CM | POA: Insufficient documentation

## 2016-02-17 LAB — CBC WITH DIFFERENTIAL/PLATELET
BASOS ABS: 0 10*3/uL (ref 0.0–0.1)
Basophils Relative: 0 %
Eosinophils Absolute: 0.2 10*3/uL (ref 0.0–0.7)
Eosinophils Relative: 2 %
HEMATOCRIT: 37.7 % (ref 36.0–46.0)
HEMOGLOBIN: 11.6 g/dL — AB (ref 12.0–15.0)
LYMPHS PCT: 25 %
Lymphs Abs: 2.2 10*3/uL (ref 0.7–4.0)
MCH: 26.5 pg (ref 26.0–34.0)
MCHC: 30.8 g/dL (ref 30.0–36.0)
MCV: 86.3 fL (ref 78.0–100.0)
MONO ABS: 0.6 10*3/uL (ref 0.1–1.0)
MONOS PCT: 7 %
NEUTROS ABS: 5.7 10*3/uL (ref 1.7–7.7)
Neutrophils Relative %: 66 %
Platelets: 188 10*3/uL (ref 150–400)
RBC: 4.37 MIL/uL (ref 3.87–5.11)
RDW: 16.9 % — ABNORMAL HIGH (ref 11.5–15.5)
WBC: 8.6 10*3/uL (ref 4.0–10.5)

## 2016-02-17 LAB — URINALYSIS, ROUTINE W REFLEX MICROSCOPIC
BILIRUBIN URINE: NEGATIVE
Glucose, UA: NEGATIVE mg/dL
KETONES UR: NEGATIVE mg/dL
NITRITE: NEGATIVE
Protein, ur: >300 mg/dL — AB
SPECIFIC GRAVITY, URINE: 1.015 (ref 1.005–1.030)
pH: 7 (ref 5.0–8.0)

## 2016-02-17 LAB — URINE MICROSCOPIC-ADD ON

## 2016-02-17 LAB — BASIC METABOLIC PANEL
ANION GAP: 7 (ref 5–15)
BUN: 10 mg/dL (ref 6–20)
CHLORIDE: 109 mmol/L (ref 101–111)
CO2: 25 mmol/L (ref 22–32)
Calcium: 9.8 mg/dL (ref 8.9–10.3)
Creatinine, Ser: 0.75 mg/dL (ref 0.44–1.00)
GFR calc non Af Amer: >60 mL/min (ref >60)
Glucose, Bld: 118 mg/dL — ABNORMAL HIGH (ref 65–99)
POTASSIUM: 3.5 mmol/L (ref 3.5–5.1)
Sodium: 141 mmol/L (ref 135–145)

## 2016-02-17 NOTE — ED Provider Notes (Signed)
CSN: [id number]     Arrival date & time 02/17/16  2104 History   First MD Initiated Contact with Patient 02/17/16 2235     Chief Complaint  Patient presents with  . Recurrent UTI     (Consider location/radiation/quality/duration/timing/severity/associated sxs/prior Treatment) HPI Level V caveat dementia. History is obtained from patient's daughter who accompanies her and from records coming patient. Patient's daughter is concerned that her urinary catheter may not be flowing properly. She is also concerned that patient may have urinary tract infection. She has chronic indwelling Foley catheter. No known fever. No other associated symptoms appreciate well earlier today. Daughter is also noted wheezing since today. No vomiting. Treated with Tylenol at 2 PM today. Daughter reports that she appears confused however she is "in and out of dementia." Patient denies pain anywhere feels that her lower abdomen is "full."She complains of spasmodic pain at her bladder. Past Medical History  Diagnosis Date  . Anemia   . Arthritis   . COPD (chronic obstructive pulmonary disease) ([place])   . Chronic schizophrenia ([place])   . Dementia   . Depressive disorder   . Acute sinusitis   . Schizophrenia ([place])   . Osteoporosis   . Stenosis of lumbosacral spine   . Glaucoma   . On home oxygen therapy     2 L PER NASAL CANNULA  . GERD (gastroesophageal reflux disease)   . Stroke (HCC)     L hemiparesis  . Cervical spondylosis   . DM (diabetes mellitus) ([place])     DIET CONTROLLED  . Chronic kidney disease     chronic kidney disease per MD notes  . CHF (congestive heart failure) (HCC)     chronic and stable per MD note  . Hyperlipidemia   . Hypertension   . Foley catheter in place   . Wheelchair dependent   . Breast cancer [facility])    Past Surgical History  Procedure Laterality Date  . Appendectomy    . Cataract extraction, bilateral    . Cholecystectomy    . Uterine suspension    . Joint replacement       L TOTAL KNEE  . Breast surgery      breast cancer  . Insertion of suprapubic catheter N/A 04/29/2014    Procedure: INSERTION OF SUPRAPUBIC CATHETER;  Surgeon: Arvil Persons, MD;  Location: [facility];  Service: Urology;  Laterality: N/A;   Family History  Problem Relation Age of Onset  . Asthma Daughter   . Heart disease Sister     3 sisters  . Heart disease Brother     x 2  . Cancer Brother     multiple myloma   Social History  Substance Use Topics  . Smoking status: Former Smoker -- 1.00 packs/day for 15 years    Types: Cigarettes    Quit date: 09/16/1997  . Smokeless tobacco: None  . Alcohol Use: No   OB History    No data available     Review of Systems  Unable to perform ROS: Dementia  Genitourinary:       Chronic indwelling indwelling suprapubic catheter  Musculoskeletal:       Bedbound      Allergies  Codeine and Lisinopril  Home Medications   Prior to Admission medications   Medication Sig Start Date End Date Taking? Authorizing Provider  acetaminophen (TYLENOL) 500 MG tablet Take 500 mg by mouth 3 (three) times daily.    Yes Historical Provider, MD  albuterol (PROVENTIL) (  2.5 MG/3ML) 0.083% nebulizer solution Take 2.5 mg by nebulization 3 (three) times daily.   Yes Historical Provider, MD  amLODipine (NORVASC) 5 MG tablet Take 5 mg by mouth daily.   Yes Historical Provider, MD  aspirin 81 MG chewable tablet Chew 81 mg by mouth daily.   Yes Historical Provider, MD  Cholecalciferol (VITAMIN D3) 2000 UNITS TABS Take by mouth daily. For vitamin D deficiency   Yes Historical Provider, MD  cycloSPORINE (RESTASIS) 0.05 % ophthalmic emulsion Place 1 drop into both eyes 2 (two) times daily.    Yes Historical Provider, MD  donepezil (ARICEPT) 10 MG tablet Take 10 mg by mouth at bedtime.    Yes Historical Provider, MD  estradiol (ESTRACE) 0.1 MG/GM vaginal cream Place 1 Applicatorful vaginally 2 (two) times a week.   Yes Historical Provider, MD  FLUPHENAZINE DECANOATE IJ  Inject 25 mg/mL as directed every 30 (thirty) days.    Yes Historical Provider, MD  fluticasone (FLONASE) 50 MCG/ACT nasal spray Place 2 sprays into the nose daily. Reported on 11/10/2015   Yes Historical Provider, MD  gabapentin (NEURONTIN) 100 MG capsule Take 100 mg by mouth 3 (three) times daily.    Yes Historical Provider, MD  hydrALAZINE (APRESOLINE) 25 MG tablet Take 25 mg by mouth 2 (two) times daily.   Yes Historical Provider, MD  loratadine (CLARITIN) 10 MG tablet Take 10 mg by mouth daily.   Yes Historical Provider, MD  olopatadine (PATANOL) 0.1 % ophthalmic solution Place 1 drop into both eyes 2 (two) times daily.    Yes Historical Provider, MD  omeprazole (PRILOSEC) 20 MG capsule Take 20 mg by mouth daily.   Yes Historical Provider, MD  polyethylene glycol (MIRALAX / GLYCOLAX) packet Take 17 g by mouth daily.   Yes Historical Provider, MD  Propylene Glycol (SYSTANE BALANCE OP) Apply to eye. 1 drop in both eyes twice a day   Yes Historical Provider, MD  saccharomyces boulardii (FLORASTOR) 250 MG capsule Take 250 mg by mouth 2 (two) times daily.   Yes Historical Provider, MD  sennosides-docusate sodium (SENOKOT-S) 8.6-50 MG tablet Take 2 tablets by mouth 2 (two) times daily. HOLD FOR LOOSE STOOLS   Yes Historical Provider, MD  sertraline (ZOLOFT) 50 MG tablet Take 75 mg by mouth daily.    Yes Historical Provider, MD  vitamin B-12 (CYANOCOBALAMIN) 500 MCG tablet Take 500 mcg by mouth daily.   Yes Historical Provider, MD  vitamin C (ASCORBIC ACID) 500 MG tablet Take 500 mg by mouth daily. Supplement   Yes Historical Provider, MD  OXYGEN Inhale 2 L into the lungs.    Historical Provider, MD   BP 123/64 mmHg  Pulse 82  Temp(Src) 98.5 F (36.9 C) (Oral)  Resp 18  SpO2 99% Physical Exam  Constitutional: She appears well-developed and well-nourished.  HENT:  Head: Normocephalic and atraumatic.  Eyes: Conjunctivae are normal. Pupils are equal, round, and reactive to light.  Neck: Neck  supple. No tracheal deviation present. No thyromegaly present.  Cardiovascular: Normal rate and regular rhythm.   No murmur heard. Pulmonary/Chest: Effort normal and breath sounds normal.  Abdominal: Soft. Bowel sounds are normal. She exhibits no distension. There is no tenderness.  Obese  Genitourinary:  Suprapubic catheter in place ostomy is clean. Draining yellow urine incontinent of stool. Stool is soft and brown and no gross blood  Musculoskeletal: Normal range of motion. She exhibits no edema or tenderness.  Neurological: She is alert. Coordination normal.  Skin: Skin is warm  and dry. No rash noted.  Psychiatric: She has a normal mood and affect.  Nursing note and vitals reviewed.   ED Course  Procedures (including critical care time) Labs Review Labs Reviewed  URINE CULTURE  CBC WITH DIFFERENTIAL/PLATELET  BASIC METABOLIC PANEL  URINALYSIS, ROUTINE W REFLEX MICROSCOPIC (NOT AT [facility])    Imaging Review No results found. I have personally reviewed and evaluated these images and lab results as part of my medical decision-making.   EKG Interpretation None     Chest x-ray viewed by me Results for orders placed or performed during the hospital encounter of 02/17/16  CBC with Differential/Platelet  Result Value Ref Range   WBC 8.6 4.0 - 10.5 K/uL   RBC 4.37 3.87 - 5.11 MIL/uL   Hemoglobin 11.6 (L) 12.0 - 15.0 g/dL   HCT 37.7 36.0 - 46.0 %   MCV 86.3 78.0 - 100.0 fL   MCH 26.5 26.0 - 34.0 pg   MCHC 30.8 30.0 - 36.0 g/dL   RDW 16.9 (H) 11.5 - 15.5 %   Platelets 188 150 - 400 K/uL   Neutrophils Relative % 66 %   Neutro Abs 5.7 1.7 - 7.7 K/uL   Lymphocytes Relative 25 %   Lymphs Abs 2.2 0.7 - 4.0 K/uL   Monocytes Relative 7 %   Monocytes Absolute 0.6 0.1 - 1.0 K/uL   Eosinophils Relative 2 %   Eosinophils Absolute 0.2 0.0 - 0.7 K/uL   Basophils Relative 0 %   Basophils Absolute 0.0 0.0 - 0.1 K/uL  Basic metabolic panel  Result Value Ref Range   Sodium 141 135 -  145 mmol/L   Potassium 3.5 3.5 - 5.1 mmol/L   Chloride 109 101 - 111 mmol/L   CO2 25 22 - 32 mmol/L   Glucose, Bld 118 (H) 65 - 99 mg/dL   BUN 10 6 - 20 mg/dL   Creatinine, Ser 0.75 0.44 - 1.00 mg/dL   Calcium 9.8 8.9 - 10.3 mg/dL   GFR calc non Af Amer >60 >60 mL/min   GFR calc Af Amer >60 >60 mL/min   Anion gap 7 5 - 15  Urinalysis, Routine w reflex microscopic (not at [facility])  Result Value Ref Range   Color, Urine YELLOW YELLOW   APPearance CLOUDY (A) CLEAR   Specific Gravity, Urine 1.015 1.005 - 1.030   pH 7.0 5.0 - 8.0   Glucose, UA NEGATIVE NEGATIVE mg/dL   Hgb urine dipstick LARGE (A) NEGATIVE   Bilirubin Urine NEGATIVE NEGATIVE   Ketones, ur NEGATIVE NEGATIVE mg/dL   Protein, ur >300 (A) NEGATIVE mg/dL   Nitrite NEGATIVE NEGATIVE   Leukocytes, UA SMALL (A) NEGATIVE  Urine microscopic-add on  Result Value Ref Range   Squamous Epithelial / LPF 6-30 (A) NONE SEEN   WBC, UA TOO NUMEROUS TO COUNT 0 - 5 WBC/hpf   RBC / HPF TOO NUMEROUS TO COUNT 0 - 5 RBC/hpf   Bacteria, UA MANY (A) NONE SEEN   Casts HYALINE CASTS (A) NEGATIVE   Crystals CA OXALATE CRYSTALS (A) NEGATIVE   Dg Chest 2 View  02/17/2016  CLINICAL DATA:  Wheezing EXAM: CHEST  2 VIEW COMPARISON:  04/29/2014 FINDINGS: Chronic cardiomegaly and aortic tortuosity. These changes are accentuated by rightward rotation. Chronic scarring and mild volume loss at the left base. Chronic bronchitic markings. There is no edema, consolidation, effusion, or pneumothorax. IMPRESSION: Chronic bronchitic markings and left basilar scarring. No acute superimposed finding. Electronically Signed   By: Monte Fantasia  M.D.   On: 02/17/2016 23:36   Ct Abdomen Pelvis W Contrast  01/20/2016  CLINICAL DATA:  82 year old female with abdominal pain and distention. EXAM: CT ABDOMEN AND PELVIS WITH CONTRAST TECHNIQUE: Multidetector CT imaging of the abdomen and pelvis was performed using the standard protocol following bolus administration of  intravenous contrast. CONTRAST:  158mL ISOVUE-300 IOPAMIDOL (ISOVUE-300) INJECTION 61% COMPARISON:  12/21/2013 CT FINDINGS: Lower chest:  Cardiomegaly and mild bibasilar scarring noted. Hepatobiliary: The liver and gallbladder are unremarkable. There is no evidence of biliary dilatation. Pancreas: Unremarkable Spleen: Unremarkable Adrenals/Urinary Tract: Bilateral renal cortical atrophy and renal cysts are present. The largest cyst measures 12 cm within the left upper pole. There is no evidence of solid mass or hydronephrosis. A stable 1.5 cm left adrenal adenoma is noted. The right adrenal gland is unremarkable. A suprapubic catheter within the bladder is noted. Stomach/Bowel: No bowel obstruction or focal bowel wall thickening noted. A moderate amount of rectal stool is present. Vascular/Lymphatic: Aortic atherosclerotic calcifications noted without aneurysm. No enlarged lymph nodes are identified. Reproductive: Unremarkable Other: No free fluid, pneumoperitoneum or abscess. Musculoskeletal: No acute or suspicious abnormalities identified. Diffuse osteopenia noted. IMPRESSION: No evidence of acute abnormality. Moderate rectal stool. Cardiomegaly and aortic atherosclerosis. Suprapubic catheter within the bladder. Electronically Signed   By: Margarette Canada M.D.   On: 01/20/2016 21:05    MDM  No evidence of urinary tract infection. Patient has a chronic indwelling suprapubic catheter no fever no leukocytosis no vomiting. We will prescribe Keith Rake for bladder spasm. SHe'll discharged back to skilled nursing facility . She has scheduled follow-up appointment with [facility] 02/20/2016 Final diagnoses:  None  Diagnosis bladder spasms      Orlie Dakin, MD 02/18/16 [id number]

## 2016-02-17 NOTE — ED Notes (Signed)
According to EMS, pts daughter wanted her to be seen here for difficulty flushing foley cath, confusion and abnormal urine appearance. Pt has hx of recurrent UTIs. Pt arrives alert, c/o of vaginal pain.

## 2016-02-18 DIAGNOSIS — N3289 Other specified disorders of bladder: Secondary | ICD-10-CM | POA: Diagnosis not present

## 2016-02-18 MED ORDER — OXYBUTYNIN CHLORIDE ER 5 MG PO TB24: 5.0000 mg | ORAL_TABLET | Freq: Three times a day (TID) | ORAL | Status: DC | PRN

## 2016-02-18 MED ORDER — OXYBUTYNIN CHLORIDE 5 MG PO TABS: 5.0000 mg | ORAL_TABLET | Freq: Three times a day (TID) | ORAL | Status: DC | PRN

## 2016-02-18 MED ORDER — OXYBUTYNIN CHLORIDE 5 MG PO TABS
5.0000 mg | ORAL_TABLET | Freq: Three times a day (TID) | ORAL | Status: DC
  Administered 2016-02-18: 5 mg via ORAL
  Filled 2016-02-18 (×4): qty 1

## 2016-02-18 NOTE — ED Notes (Signed)
Family at bedside. 

## 2016-02-18 NOTE — Discharge Instructions (Signed)
Keep scheduled appointment with urologist 02/20/2016 and take the medication prescribed as needed for bladder spasm

## 2016-02-24 ENCOUNTER — Emergency Department (HOSPITAL_COMMUNITY): Payer: Medicare Other

## 2016-02-24 ENCOUNTER — Encounter (HOSPITAL_COMMUNITY): Payer: Self-pay | Admitting: *Deleted

## 2016-02-24 ENCOUNTER — Inpatient Hospital Stay (HOSPITAL_COMMUNITY)
Admission: EM | Admit: 2016-02-24 | Discharge: 2016-02-29 | DRG: 689 | Disposition: A | Discharge status: Skilled Nursing Facility | Payer: Medicare Other | Attending: Internal Medicine | Admitting: Internal Medicine

## 2016-02-24 DIAGNOSIS — G92 Toxic encephalopathy: Secondary | ICD-10-CM | POA: Diagnosis present

## 2016-02-24 DIAGNOSIS — Z87440 Personal history of urinary (tract) infections: Secondary | ICD-10-CM

## 2016-02-24 DIAGNOSIS — Z993 Dependence on wheelchair: Secondary | ICD-10-CM

## 2016-02-24 DIAGNOSIS — I69354 Hemiplegia and hemiparesis following cerebral infarction affecting left non-dominant side: Secondary | ICD-10-CM

## 2016-02-24 DIAGNOSIS — R410 Disorientation, unspecified: Secondary | ICD-10-CM

## 2016-02-24 DIAGNOSIS — R1909 Other intra-abdominal and pelvic swelling, mass and lump: Secondary | ICD-10-CM

## 2016-02-24 DIAGNOSIS — Z87891 Personal history of nicotine dependence: Secondary | ICD-10-CM

## 2016-02-24 DIAGNOSIS — J9611 Chronic respiratory failure with hypoxia: Secondary | ICD-10-CM | POA: Diagnosis present

## 2016-02-24 DIAGNOSIS — I10 Essential (primary) hypertension: Secondary | ICD-10-CM | POA: Diagnosis present

## 2016-02-24 DIAGNOSIS — Z96652 Presence of left artificial knee joint: Secondary | ICD-10-CM | POA: Diagnosis present

## 2016-02-24 DIAGNOSIS — Z7401 Bed confinement status: Secondary | ICD-10-CM

## 2016-02-24 DIAGNOSIS — M4800 Spinal stenosis, site unspecified: Secondary | ICD-10-CM | POA: Diagnosis present

## 2016-02-24 DIAGNOSIS — E538 Deficiency of other specified B group vitamins: Secondary | ICD-10-CM | POA: Diagnosis present

## 2016-02-24 DIAGNOSIS — N390 Urinary tract infection, site not specified: Secondary | ICD-10-CM | POA: Diagnosis not present

## 2016-02-24 DIAGNOSIS — B9620 Unspecified Escherichia coli [E. coli] as the cause of diseases classified elsewhere: Secondary | ICD-10-CM | POA: Diagnosis present

## 2016-02-24 DIAGNOSIS — K219 Gastro-esophageal reflux disease without esophagitis: Secondary | ICD-10-CM | POA: Diagnosis present

## 2016-02-24 DIAGNOSIS — M810 Age-related osteoporosis without current pathological fracture: Secondary | ICD-10-CM | POA: Diagnosis present

## 2016-02-24 DIAGNOSIS — F0150 Vascular dementia without behavioral disturbance: Secondary | ICD-10-CM | POA: Diagnosis present

## 2016-02-24 DIAGNOSIS — H409 Unspecified glaucoma: Secondary | ICD-10-CM | POA: Diagnosis present

## 2016-02-24 DIAGNOSIS — J449 Chronic obstructive pulmonary disease, unspecified: Secondary | ICD-10-CM | POA: Diagnosis present

## 2016-02-24 DIAGNOSIS — E785 Hyperlipidemia, unspecified: Secondary | ICD-10-CM | POA: Diagnosis present

## 2016-02-24 DIAGNOSIS — R4182 Altered mental status, unspecified: Secondary | ICD-10-CM | POA: Diagnosis not present

## 2016-02-24 DIAGNOSIS — R140 Abdominal distension (gaseous): Secondary | ICD-10-CM

## 2016-02-24 DIAGNOSIS — Z9981 Dependence on supplemental oxygen: Secondary | ICD-10-CM

## 2016-02-24 DIAGNOSIS — N319 Neuromuscular dysfunction of bladder, unspecified: Secondary | ICD-10-CM | POA: Diagnosis present

## 2016-02-24 DIAGNOSIS — N3090 Cystitis, unspecified without hematuria: Secondary | ICD-10-CM

## 2016-02-24 DIAGNOSIS — G9340 Encephalopathy, unspecified: Secondary | ICD-10-CM | POA: Diagnosis present

## 2016-02-24 DIAGNOSIS — Z853 Personal history of malignant neoplasm of breast: Secondary | ICD-10-CM

## 2016-02-24 DIAGNOSIS — E1140 Type 2 diabetes mellitus with diabetic neuropathy, unspecified: Secondary | ICD-10-CM | POA: Diagnosis present

## 2016-02-24 DIAGNOSIS — A499 Bacterial infection, unspecified: Secondary | ICD-10-CM | POA: Diagnosis present

## 2016-02-24 DIAGNOSIS — Z7951 Long term (current) use of inhaled steroids: Secondary | ICD-10-CM

## 2016-02-24 DIAGNOSIS — Z1612 Extended spectrum beta lactamase (ESBL) resistance: Secondary | ICD-10-CM

## 2016-02-24 DIAGNOSIS — Z8249 Family history of ischemic heart disease and other diseases of the circulatory system: Secondary | ICD-10-CM

## 2016-02-24 DIAGNOSIS — F209 Schizophrenia, unspecified: Secondary | ICD-10-CM | POA: Diagnosis present

## 2016-02-24 LAB — I-STAT ARTERIAL BLOOD GAS, ED
ACID-BASE EXCESS: 3 mmol/L — AB (ref 0.0–2.0)
Bicarbonate: 28.6 mEq/L — ABNORMAL HIGH (ref 20.0–24.0)
O2 SAT: 99 %
PCO2 ART: 47.6 mmHg — AB (ref 35.0–45.0)
PH ART: 7.386 (ref 7.350–7.450)
PO2 ART: 121 mmHg — AB (ref 80.0–100.0)
Patient temperature: 98.6
TCO2: 30 mmol/L (ref 0–100)

## 2016-02-24 LAB — COMPREHENSIVE METABOLIC PANEL
ALK PHOS: 68 U/L (ref 38–126)
ALT: 10 U/L — ABNORMAL LOW (ref 14–54)
ANION GAP: 5 (ref 5–15)
AST: 13 U/L — ABNORMAL LOW (ref 15–41)
Albumin: 3.1 g/dL — ABNORMAL LOW (ref 3.5–5.0)
BUN: 13 mg/dL (ref 6–20)
CALCIUM: 9.7 mg/dL (ref 8.9–10.3)
CO2: 26 mmol/L (ref 22–32)
Chloride: 110 mmol/L (ref 101–111)
Creatinine, Ser: 0.67 mg/dL (ref 0.44–1.00)
GFR calc non Af Amer: >60 mL/min (ref >60)
Glucose, Bld: 104 mg/dL — ABNORMAL HIGH (ref 65–99)
Potassium: 3.4 mmol/L — ABNORMAL LOW (ref 3.5–5.1)
SODIUM: 141 mmol/L (ref 135–145)
TOTAL PROTEIN: 6.2 g/dL — AB (ref 6.5–8.1)
Total Bilirubin: 0.5 mg/dL (ref 0.3–1.2)

## 2016-02-24 LAB — URINALYSIS, ROUTINE W REFLEX MICROSCOPIC
BILIRUBIN URINE: NEGATIVE
Glucose, UA: NEGATIVE mg/dL
KETONES UR: 15 mg/dL — AB
NITRITE: POSITIVE — AB
Protein, ur: 30 mg/dL — AB
Specific Gravity, Urine: 1.023 (ref 1.005–1.030)
pH: 6.5 (ref 5.0–8.0)

## 2016-02-24 LAB — CBC WITH DIFFERENTIAL/PLATELET
BASOS PCT: 0 %
Basophils Absolute: 0 10*3/uL (ref 0.0–0.1)
EOS ABS: 0.1 10*3/uL (ref 0.0–0.7)
EOS PCT: 2 %
HCT: 32.9 % — ABNORMAL LOW (ref 36.0–46.0)
Hemoglobin: 9.8 g/dL — ABNORMAL LOW (ref 12.0–15.0)
Lymphocytes Relative: 31 %
Lymphs Abs: 1.9 10*3/uL (ref 0.7–4.0)
MCH: 25.7 pg — AB (ref 26.0–34.0)
MCHC: 29.8 g/dL — AB (ref 30.0–36.0)
MCV: 86.4 fL (ref 78.0–100.0)
Monocytes Absolute: 0.4 10*3/uL (ref 0.1–1.0)
Monocytes Relative: 6 %
NEUTROS ABS: 3.6 10*3/uL (ref 1.7–7.7)
NEUTROS PCT: 61 %
Platelets: 158 10*3/uL (ref 150–400)
RBC: 3.81 MIL/uL — ABNORMAL LOW (ref 3.87–5.11)
RDW: 17 % — AB (ref 11.5–15.5)
WBC: 5.9 10*3/uL (ref 4.0–10.5)

## 2016-02-24 LAB — URINE MICROSCOPIC-ADD ON

## 2016-02-24 LAB — TROPONIN I

## 2016-02-24 LAB — I-STAT CG4 LACTIC ACID, ED
LACTIC ACID, VENOUS: 0.69 mmol/L (ref 0.5–2.0)
Lactic Acid, Venous: 0.66 mmol/L (ref 0.5–2.0)

## 2016-02-24 MED ORDER — DEXTROSE 5 % IV SOLN
2.0000 g | Freq: Once | INTRAVENOUS | Status: AC
  Administered 2016-02-24: 2 g via INTRAVENOUS
  Filled 2016-02-24: qty 2

## 2016-02-24 MED ORDER — DEXTROSE 5 % IV SOLN
1.0000 g | Freq: Two times a day (BID) | INTRAVENOUS | Status: DC
  Administered 2016-02-25 – 2016-02-26 (×4): 1 g via INTRAVENOUS
  Filled 2016-02-24 (×7): qty 1

## 2016-02-24 NOTE — ED Notes (Signed)
Patient presents from [facility] with altered mental status.  Patient with history of chronic UTI's, discharged 5/11 for the same.  Patient will arouse with painful stimuli and calling her name loudly  Patient is on oxygen 2liters via [place] continously

## 2016-02-24 NOTE — ED Notes (Signed)
MD at bedside. 

## 2016-02-24 NOTE — ED Provider Notes (Signed)
CSN: [id number]     Arrival date & time 02/24/16  1937 History   First MD Initiated Contact with Patient 02/24/16 2005     Chief Complaint  Patient presents with  . Altered Mental Status   HPI Patient presents from [facility] with altered mental status. Patient with history of chronic UTI's, discharged 5/11 for the same.Patient is on oxygen 2liters via [place] continuously which is her baseline. History limited due to patient's mental status but her sister, who is at the bedside, patient was in her usual state of health until yesterday. However, when she visited her today at the facility she noted the patient to be extremely lethargic. She noted in her suprapubic catheter had a full border and that it was kinked. Patient had been complaining of some abdominal pain but this resolved after daughter unkinked the suprapubic catheter. It is now draining well per sister. No fevers or chills. No chest pain or shortness of breath per sister. Patient recently treated for urinary tract infection. Patient at baseline is unable to move left side of body well due to a prior stroke.  Past Medical History  Diagnosis Date  . Anemia   . Arthritis   . COPD (chronic obstructive pulmonary disease) ([place])   . Chronic schizophrenia ([place])   . Dementia   . Depressive disorder   . Acute sinusitis   . Schizophrenia ([place])   . Osteoporosis   . Stenosis of lumbosacral spine   . Glaucoma   . On home oxygen therapy     2 L PER NASAL CANNULA  . GERD (gastroesophageal reflux disease)   . Stroke (HCC)     L hemiparesis  . Cervical spondylosis   . DM (diabetes mellitus) ([place])     DIET CONTROLLED  . Chronic kidney disease     chronic kidney disease per MD notes  . CHF (congestive heart failure) (HCC)     chronic and stable per MD note  . Hyperlipidemia   . Hypertension   . Foley catheter in place   . Wheelchair dependent   . Breast cancer [facility])    Past Surgical History  Procedure Laterality Date  . Appendectomy     . Cataract extraction, bilateral    . Cholecystectomy    . Uterine suspension    . Joint replacement      L TOTAL KNEE  . Breast surgery      breast cancer  . Insertion of suprapubic catheter N/A 04/29/2014    Procedure: INSERTION OF SUPRAPUBIC CATHETER;  Surgeon: Arvil Persons, MD;  Location: [facility];  Service: Urology;  Laterality: N/A;   Family History  Problem Relation Age of Onset  . Asthma Daughter   . Heart disease Sister     3 sisters  . Heart disease Brother     x 2  . Cancer Brother     multiple myloma   Social History  Substance Use Topics  . Smoking status: Former Smoker -- 1.00 packs/day for 15 years    Types: Cigarettes    Quit date: 09/16/1997  . Smokeless tobacco: None  . Alcohol Use: No   OB History    No data available     Review of Systems  Unable to perform ROS: Mental status change      Allergies  Codeine and Lisinopril  Home Medications   Prior to Admission medications   Medication Sig Start Date End Date Taking? Authorizing Provider  acetaminophen (TYLENOL) 500 MG tablet Take  500 mg by mouth every 8 (eight) hours as needed for mild pain or fever.    Yes Historical Provider, MD  albuterol (PROVENTIL) (2.5 MG/3ML) 0.083% nebulizer solution Take 2.5 mg by nebulization 3 (three) times daily.   Yes Historical Provider, MD  amLODipine (NORVASC) 5 MG tablet Take 5 mg by mouth daily.   Yes Historical Provider, MD  budesonide (PULMICORT) 0.5 MG/2ML nebulizer solution Take 0.5 mg by nebulization 2 (two) times daily.   Yes Historical Provider, MD  Cholecalciferol (VITAMIN D3) 2000 UNITS TABS Take 2,000 Units by mouth daily. For vitamin D deficiency   Yes Historical Provider, MD  cycloSPORINE (RESTASIS) 0.05 % ophthalmic emulsion Place 1 drop into both eyes 2 (two) times daily.    Yes Historical Provider, MD  donepezil (ARICEPT) 10 MG tablet Take 10 mg by mouth at bedtime.    Yes Historical Provider, MD  estradiol (ESTRACE) 0.1 MG/GM vaginal cream Place 1  Applicatorful vaginally 2 (two) times a week.   Yes Historical Provider, MD  FLUPHENAZINE DECANOATE IJ Inject 25 mg/mL as directed every 30 (thirty) days.    Yes Historical Provider, MD  fluticasone (FLONASE) 50 MCG/ACT nasal spray Place 2 sprays into the nose daily. Reported on 11/10/2015   Yes Historical Provider, MD  hydrALAZINE (APRESOLINE) 25 MG tablet Take 25 mg by mouth 2 (two) times daily.   Yes Historical Provider, MD  mirabegron ER (MYRBETRIQ) 25 MG TB24 tablet Take 25 mg by mouth daily.   Yes Historical Provider, MD  olopatadine (PATANOL) 0.1 % ophthalmic solution Place 1 drop into both eyes 2 (two) times daily.    Yes Historical Provider, MD  oxybutynin (DITROPAN) 5 MG tablet Take 1 tablet (5 mg total) by mouth every 8 (eight) hours as needed for bladder spasms. 02/18/16  Yes Orlie Dakin, MD  OXYGEN Inhale 2 L into the lungs.   Yes Historical Provider, MD  Propylene Glycol (SYSTANE BALANCE OP) Place 1 drop into both eyes 2 (two) times daily.    Yes Historical Provider, MD  saccharomyces boulardii (FLORASTOR) 250 MG capsule Take 250 mg by mouth 2 (two) times daily.   Yes Historical Provider, MD  sertraline (ZOLOFT) 50 MG tablet Take 75 mg by mouth daily.    Yes Historical Provider, MD  vitamin B-12 (CYANOCOBALAMIN) 500 MCG tablet Take 500 mcg by mouth daily.   Yes Historical Provider, MD   BP 127/73 mmHg  Pulse 71  Temp(Src) 97.9 F (36.6 C) (Rectal)  Resp 13  Wt 95.709 kg  SpO2 99% Physical Exam  Constitutional: She appears well-developed and well-nourished. No distress.  HENT:  Head: Normocephalic and atraumatic.  Eyes: Conjunctivae are normal. Right eye exhibits no discharge. Left eye exhibits no discharge.  Neck: Normal range of motion. Neck supple.  Cardiovascular: Normal rate and regular rhythm.   Pulmonary/Chest: Effort normal and breath sounds normal. No respiratory distress.  Abdominal: Soft. Bowel sounds are normal. She exhibits no distension and no mass. There is no  tenderness. There is no rebound and no guarding.  Suprapubic catheter in place draining clear yellow fluid. There is full border from the catheter site.  Musculoskeletal: She exhibits no edema.  Neurological: She is alert.  Somnolent but patient arouses to loud voice and pain. She has difficulty localizing pain due to underlying mobility issues status post stroke but does appear to wince to pain.  Skin: Skin is warm. No rash noted.  Psychiatric: She has a normal mood and affect.  Nursing note and vitals  reviewed.   ED Course  Procedures (including critical care time) Labs Review Labs Reviewed  COMPREHENSIVE METABOLIC PANEL - Abnormal; Notable for the following:    Potassium 3.4 (*)    Glucose, Bld 104 (*)    Total Protein 6.2 (*)    Albumin 3.1 (*)    AST 13 (*)    ALT 10 (*)    All other components within normal limits  CBC WITH DIFFERENTIAL/PLATELET - Abnormal; Notable for the following:    RBC 3.81 (*)    Hemoglobin 9.8 (*)    HCT 32.9 (*)    MCH 25.7 (*)    MCHC 29.8 (*)    RDW 17.0 (*)    All other components within normal limits  URINALYSIS, ROUTINE W REFLEX MICROSCOPIC (NOT AT [facility]) - Abnormal; Notable for the following:    APPearance TURBID (*)    Hgb urine dipstick SMALL (*)    Ketones, ur 15 (*)    Protein, ur 30 (*)    Nitrite POSITIVE (*)    Leukocytes, UA LARGE (*)    All other components within normal limits  URINE MICROSCOPIC-ADD ON - Abnormal; Notable for the following:    Squamous Epithelial / LPF TOO NUMEROUS TO COUNT (*)    Bacteria, UA MANY (*)    Crystals CA OXALATE CRYSTALS (*)    All other components within normal limits  I-STAT ARTERIAL BLOOD GAS, ED - Abnormal; Notable for the following:    pCO2 arterial 47.6 (*)    pO2, Arterial 121.0 (*)    Bicarbonate 28.6 (*)    Acid-Base Excess 3.0 (*)    All other components within normal limits  CULTURE, BLOOD (ROUTINE X 2)  CULTURE, BLOOD (ROUTINE X 2)  URINE CULTURE  TROPONIN I  BLOOD GAS,  ARTERIAL  I-STAT CG4 LACTIC ACID, ED  I-STAT CG4 LACTIC ACID, ED    Imaging Review Dg Chest 2 View  02/24/2016  CLINICAL DATA:  82 year old female with confusion. EXAM: CHEST  2 VIEW COMPARISON:  Chest radiograph dated 02/17/2016 FINDINGS: Two views of the chest demonstrate bibasilar atelectatic changes/scarring versus less likely infiltrates. There is chronic bronchiectatic changes. There is no pleural effusion or pneumothorax. Stable top-normal cardiac size. The aorta is tortuous. No acute osseous pathology. IMPRESSION: Bibasilar atelectasis/ scarring versus less likely infiltrate. Electronically Signed   By: Anner Crete M.D.   On: 02/24/2016 21:35   Ct Head Wo Contrast  02/24/2016  CLINICAL DATA:  82 year old female with altered mental status. History of diabetes and dementia. EXAM: CT HEAD WITHOUT CONTRAST TECHNIQUE: Contiguous axial images were obtained from the base of the skull through the vertex without intravenous contrast. COMPARISON:  Head CT dated 12/21/2013 FINDINGS: The ventricles are dilated and the sulci are prominent compatible with age-related atrophy. Periventricular and deep white matter hypodensities represent chronic microvascular ischemic changes. There is no intracranial hemorrhage. No mass effect or midline shift identified. The visualized paranasal sinuses and mastoid air cells are well aerated. The calvarium is intact. There is hyperostosis frontalis. IMPRESSION: No acute intracranial hemorrhage. Age-related atrophy and chronic microvascular ischemic disease. If symptoms persist and there are no contraindications, MRI may provide better evaluation if clinically indicated. Electronically Signed   By: Anner Crete M.D.   On: 02/24/2016 23:38   I have personally reviewed and evaluated these images and lab results as part of my medical decision-making.   EKG Interpretation   Date/Time:  Saturday February 24 2016 21:13:39 EDT Ventricular Rate:  65 PR Interval:  134 QRS  Duration: 100 QT Interval:  413 QTC Calculation: 429 R Axis:   50 Text Interpretation:  Sinus rhythm No significant change since last  tracing Confirmed by Elliott ([id number]) on 02/25/2016 1:16:59 AM      MDM   Final diagnoses:  Confusion  Cystitis    Labs and imaging reviewed. Likely mental status change secondary to urinary tract infection. Antibiotics administered. Troponin is negative. Chest x-ray without obvious pneumonia. Patient on her baseline 2 L. Head CT without acute stroke. Potassium slightly low and we'll replace. CO2 is slightly elevated but pH is normal, suspect chronic. Will admit patient for treatment of her urinary tract infection.   Karma Greaser, MD 02/25/16 [id number]  Gareth Morgan, MD 02/26/16 1238

## 2016-02-25 DIAGNOSIS — R4182 Altered mental status, unspecified: Secondary | ICD-10-CM | POA: Diagnosis present

## 2016-02-25 DIAGNOSIS — F209 Schizophrenia, unspecified: Secondary | ICD-10-CM | POA: Diagnosis present

## 2016-02-25 DIAGNOSIS — N390 Urinary tract infection, site not specified: Secondary | ICD-10-CM | POA: Diagnosis present

## 2016-02-25 DIAGNOSIS — Z8249 Family history of ischemic heart disease and other diseases of the circulatory system: Secondary | ICD-10-CM | POA: Diagnosis not present

## 2016-02-25 DIAGNOSIS — Z993 Dependence on wheelchair: Secondary | ICD-10-CM | POA: Diagnosis not present

## 2016-02-25 DIAGNOSIS — G9340 Encephalopathy, unspecified: Secondary | ICD-10-CM

## 2016-02-25 DIAGNOSIS — Z96652 Presence of left artificial knee joint: Secondary | ICD-10-CM | POA: Diagnosis present

## 2016-02-25 DIAGNOSIS — E1140 Type 2 diabetes mellitus with diabetic neuropathy, unspecified: Secondary | ICD-10-CM | POA: Diagnosis present

## 2016-02-25 DIAGNOSIS — Z7951 Long term (current) use of inhaled steroids: Secondary | ICD-10-CM | POA: Diagnosis not present

## 2016-02-25 DIAGNOSIS — E538 Deficiency of other specified B group vitamins: Secondary | ICD-10-CM

## 2016-02-25 DIAGNOSIS — F0150 Vascular dementia without behavioral disturbance: Secondary | ICD-10-CM | POA: Diagnosis present

## 2016-02-25 DIAGNOSIS — K219 Gastro-esophageal reflux disease without esophagitis: Secondary | ICD-10-CM | POA: Diagnosis present

## 2016-02-25 DIAGNOSIS — Z87440 Personal history of urinary (tract) infections: Secondary | ICD-10-CM | POA: Diagnosis not present

## 2016-02-25 DIAGNOSIS — J9611 Chronic respiratory failure with hypoxia: Secondary | ICD-10-CM | POA: Diagnosis present

## 2016-02-25 DIAGNOSIS — E785 Hyperlipidemia, unspecified: Secondary | ICD-10-CM | POA: Diagnosis present

## 2016-02-25 DIAGNOSIS — N319 Neuromuscular dysfunction of bladder, unspecified: Secondary | ICD-10-CM

## 2016-02-25 DIAGNOSIS — Z87891 Personal history of nicotine dependence: Secondary | ICD-10-CM | POA: Diagnosis not present

## 2016-02-25 DIAGNOSIS — I10 Essential (primary) hypertension: Secondary | ICD-10-CM | POA: Diagnosis present

## 2016-02-25 DIAGNOSIS — R1909 Other intra-abdominal and pelvic swelling, mass and lump: Secondary | ICD-10-CM | POA: Diagnosis not present

## 2016-02-25 DIAGNOSIS — G92 Toxic encephalopathy: Secondary | ICD-10-CM | POA: Diagnosis present

## 2016-02-25 DIAGNOSIS — N3001 Acute cystitis with hematuria: Secondary | ICD-10-CM

## 2016-02-25 DIAGNOSIS — I69354 Hemiplegia and hemiparesis following cerebral infarction affecting left non-dominant side: Secondary | ICD-10-CM | POA: Diagnosis not present

## 2016-02-25 DIAGNOSIS — Z9981 Dependence on supplemental oxygen: Secondary | ICD-10-CM | POA: Diagnosis not present

## 2016-02-25 DIAGNOSIS — H409 Unspecified glaucoma: Secondary | ICD-10-CM | POA: Diagnosis present

## 2016-02-25 DIAGNOSIS — A499 Bacterial infection, unspecified: Secondary | ICD-10-CM | POA: Diagnosis not present

## 2016-02-25 DIAGNOSIS — J449 Chronic obstructive pulmonary disease, unspecified: Secondary | ICD-10-CM | POA: Diagnosis present

## 2016-02-25 DIAGNOSIS — M4800 Spinal stenosis, site unspecified: Secondary | ICD-10-CM | POA: Diagnosis present

## 2016-02-25 DIAGNOSIS — M810 Age-related osteoporosis without current pathological fracture: Secondary | ICD-10-CM | POA: Diagnosis present

## 2016-02-25 DIAGNOSIS — Z7401 Bed confinement status: Secondary | ICD-10-CM | POA: Diagnosis not present

## 2016-02-25 DIAGNOSIS — B9620 Unspecified Escherichia coli [E. coli] as the cause of diseases classified elsewhere: Secondary | ICD-10-CM | POA: Diagnosis present

## 2016-02-25 DIAGNOSIS — Z853 Personal history of malignant neoplasm of breast: Secondary | ICD-10-CM | POA: Diagnosis not present

## 2016-02-25 LAB — CBC
HEMATOCRIT: 35.7 % — AB (ref 36.0–46.0)
HEMOGLOBIN: 10.4 g/dL — AB (ref 12.0–15.0)
MCH: 25.4 pg — AB (ref 26.0–34.0)
MCHC: 29.1 g/dL — AB (ref 30.0–36.0)
MCV: 87.3 fL (ref 78.0–100.0)
Platelets: 162 10*3/uL (ref 150–400)
RBC: 4.09 MIL/uL (ref 3.87–5.11)
RDW: 16.9 % — ABNORMAL HIGH (ref 11.5–15.5)
WBC: 6.9 10*3/uL (ref 4.0–10.5)

## 2016-02-25 LAB — BASIC METABOLIC PANEL
ANION GAP: 7 (ref 5–15)
BUN: 12 mg/dL (ref 6–20)
CHLORIDE: 109 mmol/L (ref 101–111)
CO2: 25 mmol/L (ref 22–32)
Calcium: 9.8 mg/dL (ref 8.9–10.3)
Creatinine, Ser: 0.61 mg/dL (ref 0.44–1.00)
GFR calc non Af Amer: >60 mL/min (ref >60)
GLUCOSE: 97 mg/dL (ref 65–99)
POTASSIUM: 3.8 mmol/L (ref 3.5–5.1)
Sodium: 141 mmol/L (ref 135–145)

## 2016-02-25 LAB — GLUCOSE, CAPILLARY: Glucose-Capillary: 99 mg/dL (ref 65–99)

## 2016-02-25 LAB — MRSA PCR SCREENING: MRSA by PCR: NEGATIVE

## 2016-02-25 MED ORDER — ONDANSETRON HCL 4 MG PO TABS: 4.0000 mg | ORAL_TABLET | Freq: Four times a day (QID) | ORAL | Status: DC | PRN

## 2016-02-25 MED ORDER — BUDESONIDE 0.5 MG/2ML IN SUSP
0.5000 mg | Freq: Two times a day (BID) | RESPIRATORY_TRACT | Status: DC
  Administered 2016-02-25 – 2016-02-29 (×9): 0.5 mg via RESPIRATORY_TRACT
  Filled 2016-02-25 (×9): qty 2

## 2016-02-25 MED ORDER — VITAMIN B-12 1000 MCG PO TABS
500.0000 ug | ORAL_TABLET | Freq: Every day | ORAL | Status: DC
  Administered 2016-02-25 – 2016-02-29 (×5): 500 ug via ORAL
  Filled 2016-02-25 (×5): qty 1

## 2016-02-25 MED ORDER — DONEPEZIL HCL 10 MG PO TABS
10.0000 mg | ORAL_TABLET | Freq: Every day | ORAL | Status: DC
  Administered 2016-02-25 – 2016-02-28 (×4): 10 mg via ORAL
  Filled 2016-02-25 (×4): qty 1

## 2016-02-25 MED ORDER — ACETAMINOPHEN 650 MG RE SUPP: 650.0000 mg | Freq: Four times a day (QID) | RECTAL | Status: DC | PRN

## 2016-02-25 MED ORDER — POLYVINYL ALCOHOL 1.4 % OP SOLN
1.0000 [drp] | Freq: Two times a day (BID) | OPHTHALMIC | Status: DC
  Administered 2016-02-25 – 2016-02-29 (×9): 1 [drp] via OPHTHALMIC
  Filled 2016-02-25: qty 15

## 2016-02-25 MED ORDER — ESTRADIOL 0.1 MG/GM VA CREA
1.0000 | TOPICAL_CREAM | VAGINAL | Status: DC
  Administered 2016-02-26 – 2016-02-29 (×2): 1 via VAGINAL
  Filled 2016-02-25 (×2): qty 42.5

## 2016-02-25 MED ORDER — SERTRALINE HCL 50 MG PO TABS
75.0000 mg | ORAL_TABLET | Freq: Every day | ORAL | Status: DC
  Administered 2016-02-25 – 2016-02-29 (×5): 75 mg via ORAL
  Filled 2016-02-25 (×5): qty 2

## 2016-02-25 MED ORDER — CYCLOSPORINE 0.05 % OP EMUL
1.0000 [drp] | Freq: Two times a day (BID) | OPHTHALMIC | Status: DC
  Administered 2016-02-25 – 2016-02-29 (×9): 1 [drp] via OPHTHALMIC
  Filled 2016-02-25 (×9): qty 1

## 2016-02-25 MED ORDER — ACETAMINOPHEN 325 MG PO TABS: 650.0000 mg | ORAL_TABLET | Freq: Four times a day (QID) | ORAL | Status: DC | PRN

## 2016-02-25 MED ORDER — VITAMIN D 1000 UNITS PO TABS
2000.0000 [IU] | ORAL_TABLET | Freq: Every day | ORAL | Status: DC
  Administered 2016-02-25 – 2016-02-29 (×5): 2000 [IU] via ORAL
  Filled 2016-02-25 (×5): qty 2

## 2016-02-25 MED ORDER — AMLODIPINE BESYLATE 5 MG PO TABS
5.0000 mg | ORAL_TABLET | Freq: Every day | ORAL | Status: DC
  Administered 2016-02-25 – 2016-02-29 (×5): 5 mg via ORAL
  Filled 2016-02-25 (×6): qty 1

## 2016-02-25 MED ORDER — OLOPATADINE HCL 0.1 % OP SOLN
1.0000 [drp] | Freq: Two times a day (BID) | OPHTHALMIC | Status: DC
  Administered 2016-02-25 – 2016-02-29 (×9): 1 [drp] via OPHTHALMIC
  Filled 2016-02-25: qty 5

## 2016-02-25 MED ORDER — ENOXAPARIN SODIUM 40 MG/0.4ML ~~LOC~~ SOLN
40.0000 mg | Freq: Every day | SUBCUTANEOUS | Status: DC
Start: 2016-02-25 — End: 2016-02-29
  Administered 2016-02-25 – 2016-02-29 (×5): 40 mg via SUBCUTANEOUS
  Filled 2016-02-25 (×5): qty 0.4

## 2016-02-25 MED ORDER — POTASSIUM CHLORIDE 10 MEQ/100ML IV SOLN
10.0000 meq | Freq: Once | INTRAVENOUS | Status: AC
  Administered 2016-02-25: 10 meq via INTRAVENOUS
  Filled 2016-02-25: qty 100

## 2016-02-25 MED ORDER — MIRABEGRON ER 25 MG PO TB24
25.0000 mg | ORAL_TABLET | Freq: Every day | ORAL | Status: DC
  Administered 2016-02-25 – 2016-02-29 (×5): 25 mg via ORAL
  Filled 2016-02-25 (×5): qty 1

## 2016-02-25 MED ORDER — ONDANSETRON HCL 4 MG/2ML IJ SOLN: 4.0000 mg | Freq: Four times a day (QID) | INTRAMUSCULAR | Status: DC | PRN

## 2016-02-25 MED ORDER — SODIUM CHLORIDE 0.9 % IV SOLN
INTRAVENOUS | Status: DC
  Administered 2016-02-25 – 2016-02-26 (×2): via INTRAVENOUS
  Administered 2016-02-26: 75 mL/h via INTRAVENOUS
  Administered 2016-02-27 – 2016-02-28 (×3): via INTRAVENOUS

## 2016-02-25 MED ORDER — ACETAMINOPHEN 500 MG PO TABS: 500.0000 mg | ORAL_TABLET | Freq: Three times a day (TID) | ORAL | Status: DC | PRN

## 2016-02-25 MED ORDER — ALBUTEROL SULFATE (2.5 MG/3ML) 0.083% IN NEBU: 2.5000 mg | INHALATION_SOLUTION | RESPIRATORY_TRACT | Status: DC | PRN

## 2016-02-25 MED ORDER — SODIUM CHLORIDE 0.9 % IV SOLN: Freq: Once | INTRAVENOUS | Status: DC

## 2016-02-25 MED ORDER — FLUTICASONE PROPIONATE 50 MCG/ACT NA SUSP
2.0000 | Freq: Every day | NASAL | Status: DC
  Administered 2016-02-25 – 2016-02-29 (×5): 2 via NASAL
  Filled 2016-02-25 (×2): qty 16

## 2016-02-25 MED ORDER — SODIUM CHLORIDE 0.9 % IV SOLN
Freq: Once | INTRAVENOUS | Status: AC
  Administered 2016-02-25: 03:00:00 via INTRAVENOUS

## 2016-02-25 MED ORDER — HYDRALAZINE HCL 25 MG PO TABS
25.0000 mg | ORAL_TABLET | Freq: Two times a day (BID) | ORAL | Status: DC
  Administered 2016-02-25 – 2016-02-29 (×9): 25 mg via ORAL
  Filled 2016-02-25 (×9): qty 1

## 2016-02-25 MED ORDER — ASPIRIN 81 MG PO CHEW
81.0000 mg | CHEWABLE_TABLET | Freq: Every day | ORAL | Status: DC
  Administered 2016-02-26 – 2016-02-29 (×4): 81 mg via ORAL
  Filled 2016-02-25 (×4): qty 1

## 2016-02-25 MED ORDER — SACCHAROMYCES BOULARDII 250 MG PO CAPS
250.0000 mg | ORAL_CAPSULE | Freq: Two times a day (BID) | ORAL | Status: DC
  Administered 2016-02-25 – 2016-02-29 (×9): 250 mg via ORAL
  Filled 2016-02-25 (×8): qty 1

## 2016-02-25 MED ORDER — ALBUTEROL SULFATE (2.5 MG/3ML) 0.083% IN NEBU
2.5000 mg | INHALATION_SOLUTION | Freq: Three times a day (TID) | RESPIRATORY_TRACT | Status: DC
  Administered 2016-02-25 – 2016-02-27 (×8): 2.5 mg via RESPIRATORY_TRACT
  Filled 2016-02-25 (×8): qty 3

## 2016-02-25 MED ORDER — OXYBUTYNIN CHLORIDE 5 MG PO TABS: 5.0000 mg | ORAL_TABLET | Freq: Three times a day (TID) | ORAL | Status: DC | PRN

## 2016-02-25 NOTE — ED Notes (Signed)
Dr Tamala Julian admitting at bedside

## 2016-02-25 NOTE — H&P (Signed)
History and Physical    [name] [id number] DOB: 10-Jan-1926 DOA: 02/24/2016  Referring MD/NP/PA: Allyson Sabal PCP: Blanchie Serve, MD  Patient [facility] from[facility]  Chief Complaint: Altered mental status  HPI: [name] is a 82 y.o. female with medical history significant of COPD, dementia, schizophrenia, CVA with left-sided hemiparesis, chronic respiratory failure on nasal cannula oxygen 2 L; presents after being found to be acutely altered at the nursing facility Patient's 2 daughters at bedside provide history as patient is unable to do so at this time due to her altered state. They state that today she was noted to be unresponsive and more somnolent.  Family notes that she recently had her suprapubic catheter exchanged out on 6/6. During that time her urologist Dr. Sharin Grave also performed a cystoscopy and reported that she still had some inflammation of her bladder. At that time he placed her on ciprofloxacin 250 mg to take twice a day 7 days then take 250 mg once daily for 7 days. After she completed the the antibiotics family states that that's when she became acutely altered. She was seen on previous cultures to be positive for Pseudomonas that was sensitive to most antibiotics.   ED Course: Upon admission to the emergency department patient was evaluated and seen to be afebrile, vitals otherwise within normal limits. Lab work was relatively unremarkable. Urinalysis however showed many bacteria. Large leukocytes, positive nitrates, 6-30 RBC, and too numerous to count squamous epithelial cells and WBCs. Chest x-ray revealed basilar atelectasis. Patient was started on cefepime in the ED TRH consulted to admit.  Review of Systems: As per HPI otherwise 10 point review of systems negative.   Past Medical History  Diagnosis Date  . Anemia   . Arthritis   . COPD (chronic obstructive pulmonary disease) ([place])   . Chronic schizophrenia ([place])   . Dementia   . Depressive disorder   .  Acute sinusitis   . Schizophrenia ([place])   . Osteoporosis   . Stenosis of lumbosacral spine   . Glaucoma   . On home oxygen therapy     2 L PER NASAL CANNULA  . GERD (gastroesophageal reflux disease)   . Stroke ([facility])     L hemiparesis  . Cervical spondylosis   . DM (diabetes mellitus) ([place])     DIET CONTROLLED  . Chronic kidney disease     chronic kidney disease per MD notes  . CHF (congestive heart failure) (H[facility])     chronic and stable per MD note  . Hyperlipidemia   . Hypertension   . Foley catheter in place   . Wheelchair dependent   . Breast cancer [facility])     Past Surgical History  Procedure Laterality Date  . Appendectomy    . Cataract extraction, bilateral    . Cholecystectomy    . Uterine suspension    . Joint replacement      L TOTAL KNEE  . Breast surgery      breast cancer  . Insertion of suprapubic catheter N/A 04/29/2014    Procedure: INSERTION OF SUPRAPUBIC CATHETER;  Surgeon: Arvil Persons, MD;  Location: [facility];  Service: Urology;  Laterality: N/A;     reports that she quit smoking about 18 years ago. Her smoking use included Cigarettes. She has a 15 pack-year smoking history. She does not have any smokeless tobacco history on file. She reports that she does not drink alcohol or use illicit drugs.  Allergies  Allergen Reactions  .  Codeine     Documented on MAR  . Lisinopril     Per MAR leg weakness    Family History  Problem Relation Age of Onset  . Asthma Daughter   . Heart disease Sister     3 sisters  . Heart disease Brother     x 2  . Cancer Brother     multiple myloma    Prior to Admission medications   Medication Sig Start Date End Date Taking? Authorizing Provider  acetaminophen (TYLENOL) 500 MG tablet Take 500 mg by mouth every 8 (eight) hours as needed for mild pain or fever.    Yes Historical Provider, MD  albuterol (PROVENTIL) (2.5 MG/3ML) 0.083% nebulizer solution Take 2.5 mg by nebulization 3 (three) times daily.   Yes Historical  Provider, MD  amLODipine (NORVASC) 5 MG tablet Take 5 mg by mouth daily.   Yes Historical Provider, MD  budesonide (PULMICORT) 0.5 MG/2ML nebulizer solution Take 0.5 mg by nebulization 2 (two) times daily.   Yes Historical Provider, MD  Cholecalciferol (VITAMIN D3) 2000 UNITS TABS Take 2,000 Units by mouth daily. For vitamin D deficiency   Yes Historical Provider, MD  cycloSPORINE (RESTASIS) 0.05 % ophthalmic emulsion Place 1 drop into both eyes 2 (two) times daily.    Yes Historical Provider, MD  donepezil (ARICEPT) 10 MG tablet Take 10 mg by mouth at bedtime.    Yes Historical Provider, MD  estradiol (ESTRACE) 0.1 MG/GM vaginal cream Place 1 Applicatorful vaginally 2 (two) times a week.   Yes Historical Provider, MD  FLUPHENAZINE DECANOATE IJ Inject 25 mg/mL as directed every 30 (thirty) days.    Yes Historical Provider, MD  fluticasone (FLONASE) 50 MCG/ACT nasal spray Place 2 sprays into the nose daily. Reported on 11/10/2015   Yes Historical Provider, MD  hydrALAZINE (APRESOLINE) 25 MG tablet Take 25 mg by mouth 2 (two) times daily.   Yes Historical Provider, MD  mirabegron ER (MYRBETRIQ) 25 MG TB24 tablet Take 25 mg by mouth daily.   Yes Historical Provider, MD  olopatadine (PATANOL) 0.1 % ophthalmic solution Place 1 drop into both eyes 2 (two) times daily.    Yes Historical Provider, MD  oxybutynin (DITROPAN) 5 MG tablet Take 1 tablet (5 mg total) by mouth every 8 (eight) hours as needed for bladder spasms. 02/18/16  Yes Orlie Dakin, MD  OXYGEN Inhale 2 L into the lungs.   Yes Historical Provider, MD  Propylene Glycol (SYSTANE BALANCE OP) Place 1 drop into both eyes 2 (two) times daily.    Yes Historical Provider, MD  saccharomyces boulardii (FLORASTOR) 250 MG capsule Take 250 mg by mouth 2 (two) times daily.   Yes Historical Provider, MD  sertraline (ZOLOFT) 50 MG tablet Take 75 mg by mouth daily.    Yes Historical Provider, MD  vitamin B-12 (CYANOCOBALAMIN) 500 MCG tablet Take 500 mcg by  mouth daily.   Yes Historical Provider, MD    Physical Exam: Filed Vitals:   02/24/16 2345 02/25/16 0030 02/25/16 0115 02/25/16 0247  BP: 132/67 154/72 146/72 135/76  Pulse: 64 63 63 68  Temp:    97.6 F (36.4 C)  TempSrc:    Oral  Resp: 10 12 12 14   Weight:    95.936 kg (211 lb 8 oz)  SpO2: 98% 99% 98% 100%      Constitutional: NAD, calm, comfortable Filed Vitals:   02/24/16 2345 02/25/16 0030 02/25/16 0115 02/25/16 0247  BP: 132/67 154/72 146/72 135/76  Pulse: 64 63  63 68  Temp:    97.6 F (36.4 C)  TempSrc:    Oral  Resp: 10 12 12 14   Weight:    95.936 kg (211 lb 8 oz)  SpO2: 98% 99% 98% 100%   Eyes: PERRL, lids and conjunctivae normal ENMT: Mucous membranes are moist. Posterior pharynx clear of any exudate or lesions.Normal dentition.  Neck: normal, supple, no masses, no thyromegaly Respiratory: clear to auscultation bilaterally, no wheezing, no crackles. Normal respiratory effort. No accessory muscle use.  Cardiovascular: Regular rate and rhythm, no murmurs / rubs / gallops. +1 pitting edema of the bilateral lower  extremity.. 2+ pedal pulses. No carotid bruits.  Abdomen: Suprapubic catheter in place with pus and foul odor or discharge present. Positive bowel sounds Musculoskeletal: no clubbing / cyanosis. . Contractures of the left upper extremity  Skin: no rashes, lesions, ulcers. No induration Neurologic: Left-sided hemiparesis  Psychiatric: Normal judgment and insight. Alert. Normal mood.     Labs on Admission: I have personally reviewed following labs and imaging studies  CBC:  Recent Labs Lab 02/24/16 2055  WBC 5.9  NEUTROABS 3.6  HGB 9.8*  HCT 32.9*  MCV 86.4  PLT 0000000   Basic Metabolic Panel:  Recent Labs Lab 02/24/16 2055  NA 141  K 3.4*  CL 110  CO2 26  GLUCOSE 104*  BUN 13  CREATININE 0.67  CALCIUM 9.7   GFR: Estimated Creatinine Clearance: 59.9 mL/min (by C-G formula based on Cr of 0.67). Liver Function Tests:  Recent  Labs Lab 02/24/16 2055  AST 13*  ALT 10*  ALKPHOS 68  BILITOT 0.5  PROT 6.2*  ALBUMIN 3.1*   No results for input(s): LIPASE, AMYLASE in the last 168 hours. No results for input(s): AMMONIA in the last 168 hours. Coagulation Profile: No results for input(s): INR, PROTIME in the last 168 hours. Cardiac Enzymes:  Recent Labs Lab 02/24/16 2055  TROPONINI <0.03   BNP (last 3 results) No results for input(s): PROBNP in the last 8760 hours. HbA1C: No results for input(s): HGBA1C in the last 72 hours. CBG:  Recent Labs Lab 02/25/16 0254  GLUCAP 99   Lipid Profile: No results for input(s): CHOL, HDL, LDLCALC, TRIG, CHOLHDL, LDLDIRECT in the last 72 hours. Thyroid Function Tests: No results for input(s): TSH, T4TOTAL, FREET4, T3FREE, THYROIDAB in the last 72 hours. Anemia Panel: No results for input(s): VITAMINB12, FOLATE, FERRITIN, TIBC, IRON, RETICCTPCT in the last 72 hours. Urine analysis:    Component Value Date/Time   COLORURINE YELLOW 02/24/2016 2315   COLORURINE Yellow 02/08/2014 1200   APPEARANCEUR TURBID* 02/24/2016 2315   APPEARANCEUR Cloudy 02/08/2014 1200   LABSPEC 1.023 02/24/2016 2315   LABSPEC 1.014 02/08/2014 1200   PHURINE 6.5 02/24/2016 2315   PHURINE 8.0 02/08/2014 1200   GLUCOSEU NEGATIVE 02/24/2016 2315   GLUCOSEU Negative 02/08/2014 1200   HGBUR SMALL* 02/24/2016 2315   HGBUR 2+ 02/08/2014 1200   BILIRUBINUR NEGATIVE 02/24/2016 2315   BILIRUBINUR Negative 02/08/2014 1200   KETONESUR 15* 02/24/2016 2315   KETONESUR Negative 02/08/2014 1200   PROTEINUR 30* 02/24/2016 2315   PROTEINUR Negative 02/08/2014 1200   UROBILINOGEN 0.2 12/21/2013 1132   NITRITE POSITIVE* 02/24/2016 2315   NITRITE Negative 02/08/2014 1200   LEUKOCYTESUR LARGE* 02/24/2016 2315   LEUKOCYTESUR 2+ 02/08/2014 1200   Sepsis Labs: No results found for this or any previous visit (from the past 240 hour(s)).   Radiological Exams on Admission: Dg Chest 2 View  02/24/2016   CLINICAL DATA:  82 year old female with confusion. EXAM: CHEST  2 VIEW COMPARISON:  Chest radiograph dated 02/17/2016 FINDINGS: Two views of the chest demonstrate bibasilar atelectatic changes/scarring versus less likely infiltrates. There is chronic bronchiectatic changes. There is no pleural effusion or pneumothorax. Stable top-normal cardiac size. The aorta is tortuous. No acute osseous pathology. IMPRESSION: Bibasilar atelectasis/ scarring versus less likely infiltrate. Electronically Signed   By: Anner Crete M.D.   On: 02/24/2016 21:35   Ct Head Wo Contrast  02/24/2016  CLINICAL DATA:  82 year old female with altered mental status. History of diabetes and dementia. EXAM: CT HEAD WITHOUT CONTRAST TECHNIQUE: Contiguous axial images were obtained from the base of the skull through the vertex without intravenous contrast. COMPARISON:  Head CT dated 12/21/2013 FINDINGS: The ventricles are dilated and the sulci are prominent compatible with age-related atrophy. Periventricular and deep white matter hypodensities represent chronic microvascular ischemic changes. There is no intracranial hemorrhage. No mass effect or midline shift identified. The visualized paranasal sinuses and mastoid air cells are well aerated. The calvarium is intact. There is hyperostosis frontalis. IMPRESSION: No acute intracranial hemorrhage. Age-related atrophy and chronic microvascular ischemic disease. If symptoms persist and there are no contraindications, MRI may provide better evaluation if clinically indicated. Electronically Signed   By: Anner Crete M.D.   On: 02/24/2016 23:38     Assessment/Plan Acute encephalopathy secondary to recurrent urinary tract infection/neurogenic bladder with chronic suprapubic catheter - Admit to MedSurg bed - Monitor intake and output   - Follow-up urine and blood cultures - Continue empiric antibiotics of cefepime - will need to broaden antibiotic coverage if patient clinically  declines or cultures revealed resistance - Continue oxybutynin and mirabegron question these medications are placing patient at increased risk for recurrent infection -  May consider contacting patient's urologist at some point in time   Chronic respiratory failure on 2 L of nasal cannula oxygen - Continue 2 L of nasal cannula oxygen - Continue budesonide neb twice a day  Anemia: Hemoglobin 9.8 - Continue to monitor CBC in a.m.  Essential hypertension - Continue amlodipine and hydralazine  Dementia - Continued donepezil   DVT prophylaxis: Lovenox Code Status: Full Family Communication: Discussed with patient's daughter present at bedside Disposition Plan: Likely discharge back to [facility] in 2-3 days Consults called: None Admission status: observation MedSurg  Norval Morton MD [facility] Pager [phone]  If 7PM-7AM, please contact night-coverage [email or web address] Password [email or web address]  02/25/2016, 3:04 AM

## 2016-02-25 NOTE — Consult Note (Addendum)
WOC wound consult note Reason for Consult: Consult requested for buttock and suprapubic catheter site.  Family at bedside assessed both locations and states they have greatly improved.  SP cath site was previously draining pus, they report, and wound to buttocks has healed. Wound type: Buttocks with pink dry scar tissue from previous pressure injury which has healed.  Site is approx 2X2cm with lighter-colored skin and few patchy areas of dry brown scabs. No odor, drainage, or open wounds. SP cath insertion site is clean and dry without further pus or erythremia since antibiotics have been started, according to family. Dressing procedure/placement/frequency: Continue daily cleansing around SP cath and split thickness gauze.  Buttocks can remain open to air and protect with barrier cream.  Discussed plan of care with family at the bedside and they verbalized understanding. Please re-consult if further assistance is needed.  Thank-you,  Julien Girt MSN, [place], [place], [place], [place]

## 2016-02-25 NOTE — Progress Notes (Signed)
PROGRESS NOTE  [name] [id number] DOB: November 04, 1925 DOA: 02/24/2016 PCP: Blanchie Serve, MD  Brief History:  82 year old female with a history of COPD, dementia, CVA with left hemiparesis, chronic respiratory failure on 2 L nasal cannula, schizophrenia presented from [facility] secondary to acute mental status change. The patient was recently discharged from the hospital on 01/23/2016 after treatment for hemorrhagic cystitis. She was discharged on Cefpodoxime. Recently, the patient was placed on ciprofloxacin after cystoscopy by her urologist, Dr. Sharin Grave, on 02/20/2016. Apparently, the patient was given a seven-day course of the ciprofloxacin on 02/20/2016. Upon presentation, the patient was afebrile and hemodynamically stable but confused. The patient was started on cefepime due to concerns for resistant organisms. Suprapubic catheter was changed on 02/20/2016.  Assessment/Plan: Acute encephalopathy -Secondary to UTI -Significantly improved on intravenous antibiotics -Continue cefepime pending culture data  Spinal stenosis  -This has resulted in the patient's neurogenic bladder -The patient is essentially bedbound  COPD (chronic obstructive pulmonary disease) / Chronic respiratory failure with hypoxia  - cont nebs and O2 -stable on 2L  Chronic schizophrenia  - cont home meds- stable   Essential hypertension, benign - Norvasc and Hydralazine  Vascular dementia - on Aricept  CVA - left sided is weak- left arm contracted- -restart ASA 81 mg daily  Diabetes mellitus, type II, controlled -01/21/16--Hemoglobin A1c 6.0 -Remain off medications  B12 deficiency -Continue oral supplementation   Disposition Plan:   Home 6/12 or 6/13 Family Communication:   Daughter updated at beside--total time 35 min; 1810-1845  Consultants:  none  Code Status:  FULL    Subjective: Patient denies fevers, chills, headache, chest pain, dyspnea, nausea, vomiting, diarrhea,  abdominal pain, dysuria,   Objective: Filed Vitals:   02/25/16 0852 02/25/16 1042 02/25/16 1456 02/25/16 1813  BP: 109/89 137/80  115/57  Pulse: 71 78 76 86  Temp: 98.1 F (36.7 C)   97.9 F (36.6 C)  TempSrc: Oral   Oral  Resp: 16 15 18 16   Weight:      SpO2: 99% 100% 97% 100%    Intake/Output Summary (Last 24 hours) at 02/25/16 1835 Last data filed at 02/25/16 1727  Gross per 24 hour  Intake    750 ml  Output   2000 ml  Net  -1250 ml   Weight change:  Exam:   General:  Pt is alert, follows commands appropriately, not in acute distress  HEENT: No icterus, No thrush, No neck mass, [place]/AT  Cardiovascular: RRR, S1/S2, no rubs, no gallops  Respiratory: CTA bilaterally, no wheezing, no crackles, no rhonchi  Abdomen: Soft/+BS, non tender, non distended, no guardingSuprapubic site without any erythema or drainage;  Extremities: No edema, No lymphangitis, No petechiae, No rashes, no synovitis   Data Reviewed: I have personally reviewed following labs and imaging studies Basic Metabolic Panel:  Recent Labs Lab 02/24/16 2055 02/25/16 0424  NA 141 141  K 3.4* 3.8  CL 110 109  CO2 26 25  GLUCOSE 104* 97  BUN 13 12  CREATININE 0.67 0.61  CALCIUM 9.7 9.8   Liver Function Tests:  Recent Labs Lab 02/24/16 2055  AST 13*  ALT 10*  ALKPHOS 68  BILITOT 0.5  PROT 6.2*  ALBUMIN 3.1*   No results for input(s): LIPASE, AMYLASE in the last 168 hours. No results for input(s): AMMONIA in the last 168 hours. Coagulation Profile: No results for input(s): INR, PROTIME in the last 168 hours. CBC:  Recent Labs Lab 02/24/16 2055 02/25/16 0424  WBC 5.9 6.9  NEUTROABS 3.6  --   HGB 9.8* 10.4*  HCT 32.9* 35.7*  MCV 86.4 87.3  PLT 158 162   Cardiac Enzymes:  Recent Labs Lab 02/24/16 2055  TROPONINI <0.03   BNP: Invalid input(s): POCBNP CBG:  Recent Labs Lab 02/25/16 0254  GLUCAP 99   HbA1C: No results for input(s): HGBA1C in the last 72 hours. Urine  analysis:    Component Value Date/Time   COLORURINE YELLOW 02/24/2016 2315   COLORURINE Yellow 02/08/2014 1200   APPEARANCEUR TURBID* 02/24/2016 2315   APPEARANCEUR Cloudy 02/08/2014 1200   LABSPEC 1.023 02/24/2016 2315   LABSPEC 1.014 02/08/2014 1200   PHURINE 6.5 02/24/2016 2315   PHURINE 8.0 02/08/2014 1200   GLUCOSEU NEGATIVE 02/24/2016 2315   GLUCOSEU Negative 02/08/2014 1200   HGBUR SMALL* 02/24/2016 2315   HGBUR 2+ 02/08/2014 1200   BILIRUBINUR NEGATIVE 02/24/2016 2315   BILIRUBINUR Negative 02/08/2014 1200   KETONESUR 15* 02/24/2016 2315   KETONESUR Negative 02/08/2014 1200   PROTEINUR 30* 02/24/2016 2315   PROTEINUR Negative 02/08/2014 1200   UROBILINOGEN 0.2 12/21/2013 1132   NITRITE POSITIVE* 02/24/2016 2315   NITRITE Negative 02/08/2014 1200   LEUKOCYTESUR LARGE* 02/24/2016 2315   LEUKOCYTESUR 2+ 02/08/2014 1200   Sepsis Labs: @LABRCNTIP (procalcitonin:4,lacticidven:4) ) Recent Results (from the past 240 hour(s))  MRSA PCR Screening     Status: None   Collection Time: 02/25/16  3:38 AM  Result Value Ref Range Status   MRSA by PCR NEGATIVE NEGATIVE Final    Comment:        The GeneXpert MRSA Assay (FDA approved for NASAL specimens only), is one component of a comprehensive MRSA colonization surveillance program. It is not intended to diagnose MRSA infection nor to guide or monitor treatment for MRSA infections.      Scheduled Meds: . albuterol  2.5 mg Nebulization TID  . amLODipine  5 mg Oral Daily  . budesonide  0.5 mg Nebulization BID  . ceFEPime (MAXIPIME) IV  1 g Intravenous Q12H  . cholecalciferol  2,000 Units Oral Daily  . cycloSPORINE  1 drop Both Eyes BID  . donepezil  10 mg Oral QHS  . enoxaparin (LOVENOX) injection  40 mg Subcutaneous Daily  . [START ON 02/26/2016] estradiol  1 Applicatorful Vaginal Once per day on Mon Thu  . fluticasone  2 spray Each Nare Daily  . hydrALAZINE  25 mg Oral BID  . mirabegron ER  25 mg Oral Daily  .  olopatadine  1 drop Both Eyes BID  . polyvinyl alcohol  1 drop Both Eyes BID  . saccharomyces boulardii  250 mg Oral BID  . sertraline  75 mg Oral Daily  . vitamin B-12  500 mcg Oral Daily   Continuous Infusions: . sodium chloride 75 mL/hr at 02/25/16 1102    Procedures/Studies: Dg Chest 2 View  02/24/2016  CLINICAL DATA:  82 year old female with confusion. EXAM: CHEST  2 VIEW COMPARISON:  Chest radiograph dated 02/17/2016 FINDINGS: Two views of the chest demonstrate bibasilar atelectatic changes/scarring versus less likely infiltrates. There is chronic bronchiectatic changes. There is no pleural effusion or pneumothorax. Stable top-normal cardiac size. The aorta is tortuous. No acute osseous pathology. IMPRESSION: Bibasilar atelectasis/ scarring versus less likely infiltrate. Electronically Signed   By: Anner Crete M.D.   On: 02/24/2016 21:35   Dg Chest 2 View  02/17/2016  CLINICAL DATA:  Wheezing EXAM: CHEST  2 VIEW COMPARISON:  04/29/2014  FINDINGS: Chronic cardiomegaly and aortic tortuosity. These changes are accentuated by rightward rotation. Chronic scarring and mild volume loss at the left base. Chronic bronchitic markings. There is no edema, consolidation, effusion, or pneumothorax. IMPRESSION: Chronic bronchitic markings and left basilar scarring. No acute superimposed finding. Electronically Signed   By: Monte Fantasia M.D.   On: 02/17/2016 23:36   Ct Head Wo Contrast  02/24/2016  CLINICAL DATA:  82 year old female with altered mental status. History of diabetes and dementia. EXAM: CT HEAD WITHOUT CONTRAST TECHNIQUE: Contiguous axial images were obtained from the base of the skull through the vertex without intravenous contrast. COMPARISON:  Head CT dated 12/21/2013 FINDINGS: The ventricles are dilated and the sulci are prominent compatible with age-related atrophy. Periventricular and deep white matter hypodensities represent chronic microvascular ischemic changes. There is no  intracranial hemorrhage. No mass effect or midline shift identified. The visualized paranasal sinuses and mastoid air cells are well aerated. The calvarium is intact. There is hyperostosis frontalis. IMPRESSION: No acute intracranial hemorrhage. Age-related atrophy and chronic microvascular ischemic disease. If symptoms persist and there are no contraindications, MRI may provide better evaluation if clinically indicated. Electronically Signed   By: Anner Crete M.D.   On: 02/24/2016 23:38    Dabid Godown, DO  [facility] Pager [phone]  If 7PM-7AM, please contact night-coverage [email or web address] Password [facility] 02/25/2016, 6:35 PM

## 2016-02-25 NOTE — ED Notes (Signed)
Patient opened eyes and said "hello"  Talking to this nurse then would fall back to sleep

## 2016-02-26 DIAGNOSIS — E1140 Type 2 diabetes mellitus with diabetic neuropathy, unspecified: Secondary | ICD-10-CM

## 2016-02-26 NOTE — Clinical Social Work Note (Signed)
Clinical Social Work Assessment  Patient Details  Name: [name] MRN: [id number] Date of Birth: 06-Mar-1926  Date of referral:  02/26/16               Reason for consult:  Facility Placement (Patient from [facility])                Permission sought to share information with:  Other (Patient only oriented to person.) Permission granted to share information::  No (Patient oriented to Person only)  Name::     Sharone Resch-Dean  Agency::     Relationship::  Daughter  Contact Information:  [phone] (cell)  Housing/Transportation Living arrangements for the past 2 months:  [place] [employer] transport manager) Source of Information:  Adult Children (Daughter Environmental health practitioner) Patient Interpreter Needed:  None Criminal Activity/Legal Involvement Pertinent to Current Situation/Hospitalization:  No - Comment as needed Significant Relationships:  Adult Children Lives with:  Facility Resident ([facility] patient) Do you feel safe going back to the place where you live?  Yes (Daughter had concerns regarding patient's care, however has met with the nursing director today (6/12) regarding her concerns) Need for family participation in patient care:  Yes (Comment)  Care giving concerns:  Patient's daughter expressed concerns regarding care at the facility when asked to confirm patient's return to SNF. CSW informed by Ms. Marlou Sa that she was meeting with nursing director today to discuss her concerns. CSW learned later that meeting held and daughter was satisfied with outcome.   Social Worker assessment / plan:  Daughter confirmed patient's return to [facility] when medically stable.  Employment status:  Retired Nurse, adult, Medicaid In [place] PT Recommendations:    Information / Referral to community resources:  [place] (None needed or requested at this time as patient from a skilled facility)  Patient/Family's Response to care:  No concerns  expressed regarding care during hospitalization. Concerns regarding care at SNF discussed by daughter with SNF nursing director and resolved as daughter agreed for patient to return to [facility] today.  Patient/Family's Understanding of and Emotional Response to Diagnosis, Current Treatment, and Prognosis: Not discussed.  Emotional Assessment Appearance:  Other (Comment Required (Did not visit with patient, talked with daughter by phone) Attitude/Demeanor/Rapport:  Unable to Assess Affect (typically observed):  Unable to Assess Orientation:  Oriented to Self Alcohol / Substance use:  Tobacco Use (Patient quit smoking 18 years ago and does not drink or use illicit drugs) Psych involvement (Current and /or in the community):  No (Comment)  Discharge Needs  Concerns to be addressed:  Discharge Planning Concerns Readmission within the last 30 days:  No Current discharge risk:  None Barriers to Discharge:  No Barriers Identified   Sable Feil, LCSW 02/26/2016, 4:12 PM

## 2016-02-26 NOTE — Progress Notes (Signed)
PROGRESS NOTE  [name] [id number] DOB: [birth year] DOA: 02/24/2016 PCP: Blanchie Serve, MD  Brief History:  82 year old female with a history of COPD, dementia, CVA with left hemiparesis, chronic respiratory failure on 2 L nasal cannula, schizophrenia presented from [facility] secondary to acute mental status change. The patient was recently discharged from the hospital on 01/23/2016 after treatment for hemorrhagic cystitis. She was discharged on Cefpodoxime. Recently, the patient was placed on ciprofloxacin after cystoscopy by her urologist, Dr. Sharin Grave, on 02/20/2016. Apparently, the patient was given a seven-day course of the ciprofloxacin on 02/20/2016. Upon presentation, the patient was afebrile and hemodynamically stable but confused. The patient was started on cefepime due to concerns for resistant organisms. Suprapubic catheter was changed on 02/20/2016.  Assessment/Plan: Acute encephalopathy -Secondary to UTI -Significantly improved on intravenous antibiotics -Continue cefepime pending culture data  Spinal stenosis  -This has resulted in the patient's neurogenic bladder -The patient is essentially bedbound  UTI--E Coli -continue cefepime pending culture data  COPD (chronic obstructive pulmonary disease) / Chronic respiratory failure with hypoxia  - cont nebs and O2 -stable on 2L  Chronic schizophrenia  - cont home meds- stable  Essential hypertension, benign - Norvasc and Hydralazine  Vascular dementia - on Aricept  CVA - left sided is weak- left arm contracted- -restart ASA 81 mg daily  Diabetes mellitus, type II, controlled -01/21/16--Hemoglobin A1c 6.0 -Remain off medications  B12 deficiency -Continue oral supplementation   Disposition Plan: SNF 6/13 Family Communication: Daughter updated at beside 6/11  Code Status: FULL  Subjective: Patient denies fevers, chills, headache, chest pain, dyspnea, nausea, vomiting, diarrhea,  abdominal pain,   Objective: Filed Vitals:   02/25/16 1813 02/25/16 2145 02/26/16 0452 02/26/16 0845  BP: 115/57 123/66 114/56 120/64  Pulse: 86 86 84 92  Temp: 97.9 F (36.6 C) 98.8 F (37.1 C) 98.2 F (36.8 C) 98.4 F (36.9 C)  TempSrc: Oral Oral Oral Oral  Resp: 16 16 18 18   Weight:  100.653 kg (221 lb 14.4 oz)    SpO2: 100% 98% 100% 100%    Intake/Output Summary (Last 24 hours) at 02/26/16 1157 Last data filed at 02/26/16 0900  Gross per 24 hour  Intake   1320 ml  Output    400 ml  Net    920 ml   Weight change: 4.944 kg (10 lb 14.4 oz) Exam:   General:  Pt is alert, follows commands appropriately, not in acute distress  HEENT: No icterus, No thrush, No neck mass, [place]/AT  Cardiovascular: RRR, S1/S2, no rubs, no gallops  Respiratory: CTA bilaterally, no wheezing, no crackles, no rhonchi  Abdomen: Soft/+BS, non tender, non distended, no guarding  Extremities: No edema, No lymphangitis, No petechiae, No rashes, no synovitis   Data Reviewed: I have personally reviewed following labs and imaging studies Basic Metabolic Panel:  Recent Labs Lab 02/24/16 2055 02/25/16 0424  NA 141 141  K 3.4* 3.8  CL 110 109  CO2 26 25  GLUCOSE 104* 97  BUN 13 12  CREATININE 0.67 0.61  CALCIUM 9.7 9.8   Liver Function Tests:  Recent Labs Lab 02/24/16 2055  AST 13*  ALT 10*  ALKPHOS 68  BILITOT 0.5  PROT 6.2*  ALBUMIN 3.1*   No results for input(s): LIPASE, AMYLASE in the last 168 hours. No results for input(s): AMMONIA in the last 168 hours. Coagulation Profile: No results for input(s): INR, PROTIME in the last 168 hours.  CBC:  Recent Labs Lab 02/24/16 2055 02/25/16 0424  WBC 5.9 6.9  NEUTROABS 3.6  --   HGB 9.8* 10.4*  HCT 32.9* 35.7*  MCV 86.4 87.3  PLT 158 162   Cardiac Enzymes:  Recent Labs Lab 02/24/16 2055  TROPONINI <0.03   BNP: Invalid input(s): POCBNP CBG:  Recent Labs Lab 02/25/16 0254  GLUCAP 99   HbA1C: No results for  input(s): HGBA1C in the last 72 hours. Urine analysis:    Component Value Date/Time   COLORURINE YELLOW 02/24/2016 2315   COLORURINE Yellow 02/08/2014 1200   APPEARANCEUR TURBID* 02/24/2016 2315   APPEARANCEUR Cloudy 02/08/2014 1200   LABSPEC 1.023 02/24/2016 2315   LABSPEC 1.014 02/08/2014 1200   PHURINE 6.5 02/24/2016 2315   PHURINE 8.0 02/08/2014 1200   GLUCOSEU NEGATIVE 02/24/2016 2315   GLUCOSEU Negative 02/08/2014 1200   HGBUR SMALL* 02/24/2016 2315   HGBUR 2+ 02/08/2014 1200   BILIRUBINUR NEGATIVE 02/24/2016 2315   BILIRUBINUR Negative 02/08/2014 1200   KETONESUR 15* 02/24/2016 2315   KETONESUR Negative 02/08/2014 1200   PROTEINUR 30* 02/24/2016 2315   PROTEINUR Negative 02/08/2014 1200   UROBILINOGEN 0.2 12/21/2013 1132   NITRITE POSITIVE* 02/24/2016 2315   NITRITE Negative 02/08/2014 1200   LEUKOCYTESUR LARGE* 02/24/2016 2315   LEUKOCYTESUR 2+ 02/08/2014 1200   Sepsis Labs: @LABRCNTIP (procalcitonin:4,lacticidven:4) ) Recent Results (from the past 240 hour(s))  Urine culture     Status: Abnormal (Preliminary result)   Collection Time: 02/24/16 11:15 PM  Result Value Ref Range Status   Specimen Description URINE, RANDOM  Final   Special Requests NONE  Final   Culture >=100,000 COLONIES/mL ESCHERICHIA COLI (A)  Final   Report Status PENDING  Incomplete  MRSA PCR Screening     Status: None   Collection Time: 02/25/16  3:38 AM  Result Value Ref Range Status   MRSA by PCR NEGATIVE NEGATIVE Final    Comment:        The GeneXpert MRSA Assay (FDA approved for NASAL specimens only), is one component of a comprehensive MRSA colonization surveillance program. It is not intended to diagnose MRSA infection nor to guide or monitor treatment for MRSA infections.      Scheduled Meds: . albuterol  2.5 mg Nebulization TID  . amLODipine  5 mg Oral Daily  . aspirin  81 mg Oral Daily  . budesonide  0.5 mg Nebulization BID  . ceFEPime (MAXIPIME) IV  1 g Intravenous Q12H   . cholecalciferol  2,000 Units Oral Daily  . cycloSPORINE  1 drop Both Eyes BID  . donepezil  10 mg Oral QHS  . enoxaparin (LOVENOX) injection  40 mg Subcutaneous Daily  . estradiol  1 Applicatorful Vaginal Once per day on Mon Thu  . fluticasone  2 spray Each Nare Daily  . hydrALAZINE  25 mg Oral BID  . mirabegron ER  25 mg Oral Daily  . olopatadine  1 drop Both Eyes BID  . polyvinyl alcohol  1 drop Both Eyes BID  . saccharomyces boulardii  250 mg Oral BID  . sertraline  75 mg Oral Daily  . vitamin B-12  500 mcg Oral Daily   Continuous Infusions: . sodium chloride 75 mL/hr at 02/26/16 0121    Procedures/Studies: Dg Chest 2 View  02/24/2016  CLINICAL DATA:  82 year old female with confusion. EXAM: CHEST  2 VIEW COMPARISON:  Chest radiograph dated 02/17/2016 FINDINGS: Two views of the chest demonstrate bibasilar atelectatic changes/scarring versus less likely infiltrates. There is chronic bronchiectatic  changes. There is no pleural effusion or pneumothorax. Stable top-normal cardiac size. The aorta is tortuous. No acute osseous pathology. IMPRESSION: Bibasilar atelectasis/ scarring versus less likely infiltrate. Electronically Signed   By: Anner Crete M.D.   On: 02/24/2016 21:35   Dg Chest 2 View  02/17/2016  CLINICAL DATA:  Wheezing EXAM: CHEST  2 VIEW COMPARISON:  04/29/2014 FINDINGS: Chronic cardiomegaly and aortic tortuosity. These changes are accentuated by rightward rotation. Chronic scarring and mild volume loss at the left base. Chronic bronchitic markings. There is no edema, consolidation, effusion, or pneumothorax. IMPRESSION: Chronic bronchitic markings and left basilar scarring. No acute superimposed finding. Electronically Signed   By: Monte Fantasia M.D.   On: 02/17/2016 23:36   Ct Head Wo Contrast  02/24/2016  CLINICAL DATA:  82 year old female with altered mental status. History of diabetes and dementia. EXAM: CT HEAD WITHOUT CONTRAST TECHNIQUE: Contiguous axial images  were obtained from the base of the skull through the vertex without intravenous contrast. COMPARISON:  Head CT dated 12/21/2013 FINDINGS: The ventricles are dilated and the sulci are prominent compatible with age-related atrophy. Periventricular and deep white matter hypodensities represent chronic microvascular ischemic changes. There is no intracranial hemorrhage. No mass effect or midline shift identified. The visualized paranasal sinuses and mastoid air cells are well aerated. The calvarium is intact. There is hyperostosis frontalis. IMPRESSION: No acute intracranial hemorrhage. Age-related atrophy and chronic microvascular ischemic disease. If symptoms persist and there are no contraindications, MRI may provide better evaluation if clinically indicated. Electronically Signed   By: Anner Crete M.D.   On: 02/24/2016 23:38    Kodee Ravert, DO  [facility] Pager [phone]  If 7PM-7AM, please contact night-coverage [email or web address] Password [facility] 02/26/2016, 11:57 AM   LOS: 1 day

## 2016-02-26 NOTE — NC FL2 (Signed)
[place] LEVEL OF CARE SCREENING TOOL     IDENTIFICATION  Patient Name: [name] Birthdate: [birth year] Sex: female Admission Date (Current Location): 02/24/2016  [place] and [place] Number:  Kathleen Argue [id number] [place] and Address:  [facility]. [facility], [place] [address] [place], [place] [address]      Provider Number: [id number]  Attending Physician Name and Address:  Orson Eva, MD  Relative Name and Phone Number:  Sumara Rockmore, daughter. (Cell) [phone] and (H) [phone]    Current Level of Care: Hospital Recommended Level of Care: [place] (Patient from [facility]) Prior Approval Number:    Date Approved/Denied:   PASRR Number:    Discharge Plan: SNF    Current Diagnoses: Patient Active Problem List   Diagnosis Date Noted  . Altered mental status 02/25/2016  . Encephalopathy acute 02/25/2016  . Urinary tract infection 02/25/2016  . Type 2 diabetes mellitus with diabetic neuropathy, without long-term current use of insulin ([place]) 01/10/2016  . Allergic conjunctivitis and rhinitis 12/07/2015  . Essential hypertension, benign 11/10/2015  . Constipation, chronic 11/10/2015  . Hyperlipidemia 04/15/2015  . B12 deficiency 04/15/2015  . Depression due to dementia 03/02/2015  . Neurogenic dysfunction of the urinary bladder 01/09/2015  . Major depressive disorder, recurrent, in remission ([place]) 01/09/2015  . Pain, neuropathic 01/09/2015  . Hypertensive heart and renal disease with congestive heart failure ([place]) 01/09/2015  . Chronic systolic heart failure ([place]) 01/09/2015  . Type 2 diabetes mellitus with polyneuropathy ([place]) 01/09/2015  . Chronic respiratory failure with hypoxia ([place]) 10/14/2014  . CKD stage 2 due to type 2 diabetes mellitus ([place]) 10/14/2014  . Neuropathic pain 09/15/2014  . Senile osteoporosis 09/15/2014  . Primary generalized (osteo)arthritis 09/15/2014  . Neurogenic bladder 06/13/2014  .  Hemiplegia of nondominant side following CVA (cerebrovascular accident) ([place]) 06/13/2014  . Chronic respiratory failure, unspecified whether with hypoxia or hypercapnia ([place]) 06/13/2014  . Osteoporosis, unspecified 06/13/2014  . Anemia of other chronic disease 06/13/2014  . Vascular dementia 04/14/2014  . Unspecified schizophrenia, chronic condition(295.92) 04/14/2014  . Benign hypertensive heart and kidney disease with heart failure and with chronic kidney disease stage I through stage IV, or unspecified(404.11) 04/14/2014  . Type II or unspecified type diabetes mellitus with neurological manifestations, not stated as uncontrolled 04/14/2014  . Urinary retention with incomplete bladder emptying 04/12/2014  . Unspecified vitamin D deficiency 02/28/2014  . Hemiplegia affecting nondominant side, late effect of cerebrovascular disease 02/28/2014  . Polyneuropathy in diabetes(357.2) 02/28/2014  . Esophageal reflux 02/28/2014  . Peripheral angiopathy in diseases classified elsewhere ([place]) 02/28/2014  . Anemia   . Chronic schizophrenia ([place])   . Dementia   . Depressive disorder   . Stroke ([place])   . COPD (chronic obstructive pulmonary disease) ([place]) 05/13/2012    Orientation RESPIRATION BLADDER Height & Weight     Self  O2 (2 Liters Oxygen) Incontinent, Indwelling catheter (Patient has suprapubic catheter) Weight: 221 lb 14.4 oz (100.653 kg) Height:     BEHAVIORAL SYMPTOMS/MOOD NEUROLOGICAL BOWEL NUTRITION STATUS      Continent Diet (DYS 3)  AMBULATORY STATUS COMMUNICATION OF NEEDS Skin   Extensive Assist Verbally Other (Comment) (Dry pink scar tissue on buttocks)                       Personal Care Assistance Level of Assistance  Bathing, Feeding, Dressing Bathing Assistance: Maximum assistance Feeding assistance: Limited assistance Dressing Assistance: Maximum assistance     Functional  Limitations Info  Sight, Hearing, Speech Sight Info: Adequate Hearing Info:  Adequate Speech Info: Adequate    SPECIAL CARE FACTORS FREQUENCY                       Contractures Contractures Info: Not present    Additional Factors Info  Code Status, Allergies Code Status Info: Full Allergies Info: Codeine, Lisinopril           Current Medications (02/26/2016):  This is the current hospital active medication list Current Facility-Administered Medications  Medication Dose Route Frequency Provider Last Rate Last Dose  . 0.9 %  sodium chloride infusion   Intravenous Continuous Norval Morton, MD 75 mL/hr at 02/26/16 0121    . acetaminophen (TYLENOL) tablet 650 mg  650 mg Oral Q6H PRN Norval Morton, MD       Or  . acetaminophen (TYLENOL) suppository 650 mg  650 mg Rectal Q6H PRN Norval Morton, MD      . albuterol (PROVENTIL) (2.5 MG/3ML) 0.083% nebulizer solution 2.5 mg  2.5 mg Nebulization TID Norval Morton, MD   2.5 mg at 02/26/16 0826  . albuterol (PROVENTIL) (2.5 MG/3ML) 0.083% nebulizer solution 2.5 mg  2.5 mg Nebulization Q2H PRN Rondell A Tamala Julian, MD      . amLODipine (NORVASC) tablet 5 mg  5 mg Oral Daily Norval Morton, MD   5 mg at 02/26/16 1052  . aspirin chewable tablet 81 mg  81 mg Oral Daily Orson Eva, MD   81 mg at 02/26/16 1054  . budesonide (PULMICORT) nebulizer solution 0.5 mg  0.5 mg Nebulization BID Norval Morton, MD   0.5 mg at 02/26/16 0830  . ceFEPIme (MAXIPIME) 1 g in dextrose 5 % 50 mL IVPB  1 g Intravenous Q12H Gareth Morgan, MD   1 g at 02/26/16 1052  . cholecalciferol (VITAMIN D) tablet 2,000 Units  2,000 Units Oral Daily Norval Morton, MD   2,000 Units at 02/26/16 1054  . cycloSPORINE (RESTASIS) 0.05 % ophthalmic emulsion 1 drop  1 drop Both Eyes BID Norval Morton, MD   1 drop at 02/26/16 1054  . donepezil (ARICEPT) tablet 10 mg  10 mg Oral QHS Norval Morton, MD   10 mg at 02/25/16 2053  . enoxaparin (LOVENOX) injection 40 mg  40 mg Subcutaneous Daily Norval Morton, MD   40 mg at 02/26/16 1057  . estradiol  (ESTRACE) vaginal cream 1 Applicatorful  1 Applicatorful Vaginal Once per day on Mon Thu Norval Morton, MD   1 Applicatorful at [id number]  . fluticasone (FLONASE) 50 MCG/ACT nasal spray 2 spray  2 spray Each Nare Daily Norval Morton, MD   2 spray at 02/26/16 1052  . hydrALAZINE (APRESOLINE) tablet 25 mg  25 mg Oral BID Norval Morton, MD   25 mg at 02/26/16 1055  . mirabegron ER (MYRBETRIQ) tablet 25 mg  25 mg Oral Daily Norval Morton, MD   25 mg at 02/26/16 1052  . olopatadine (PATANOL) 0.1 % ophthalmic solution 1 drop  1 drop Both Eyes BID Norval Morton, MD   1 drop at 02/26/16 1054  . ondansetron (ZOFRAN) tablet 4 mg  4 mg Oral Q6H PRN Norval Morton, MD       Or  . ondansetron (ZOFRAN) injection 4 mg  4 mg Intravenous Q6H PRN Norval Morton, MD      . oxybutynin (DITROPAN) tablet 5 mg  5 mg Oral Q8H PRN Norval Morton, MD      . polyvinyl alcohol (LIQUIFILM TEARS) 1.4 % ophthalmic solution 1 drop  1 drop Both Eyes BID Norval Morton, MD   1 drop at 02/26/16 1052  . saccharomyces boulardii (FLORASTOR) capsule 250 mg  250 mg Oral BID Norval Morton, MD   250 mg at 02/26/16 1054  . sertraline (ZOLOFT) tablet 75 mg  75 mg Oral Daily Norval Morton, MD   75 mg at 02/26/16 1052  . vitamin B-12 (CYANOCOBALAMIN) tablet 500 mcg  500 mcg Oral Daily Norval Morton, MD   500 mcg at 02/26/16 1055     Discharge Medications: Please see discharge summary for a list of discharge medications.  Relevant Imaging Results:  Relevant Lab Results:   Additional Information [id number]  Sable Feil, LCSW

## 2016-02-27 LAB — CBC
HCT: 35.1 % — ABNORMAL LOW (ref 36.0–46.0)
Hemoglobin: 10.6 g/dL — ABNORMAL LOW (ref 12.0–15.0)
MCH: 25.9 pg — ABNORMAL LOW (ref 26.0–34.0)
MCHC: 30.2 g/dL (ref 30.0–36.0)
MCV: 85.8 fL (ref 78.0–100.0)
PLATELETS: UNDETERMINED 10*3/uL (ref 150–400)
RBC: 4.09 MIL/uL (ref 3.87–5.11)
RDW: 17 % — AB (ref 11.5–15.5)
WBC: 6.1 10*3/uL (ref 4.0–10.5)

## 2016-02-27 LAB — BASIC METABOLIC PANEL
Anion gap: 8 (ref 5–15)
BUN: 8 mg/dL (ref 6–20)
CO2: 22 mmol/L (ref 22–32)
CREATININE: 0.42 mg/dL — AB (ref 0.44–1.00)
Calcium: 9.5 mg/dL (ref 8.9–10.3)
Chloride: 110 mmol/L (ref 101–111)
Glucose, Bld: 107 mg/dL — ABNORMAL HIGH (ref 65–99)
POTASSIUM: 4.2 mmol/L (ref 3.5–5.1)
SODIUM: 140 mmol/L (ref 135–145)

## 2016-02-27 LAB — URINE CULTURE: Culture: >=100000 — AB

## 2016-02-27 MED ORDER — SODIUM CHLORIDE 0.9 % IV SOLN
500.0000 mg | Freq: Three times a day (TID) | INTRAVENOUS | Status: DC
  Administered 2016-02-27 – 2016-02-28 (×3): 500 mg via INTRAVENOUS
  Filled 2016-02-27 (×6): qty 500

## 2016-02-27 NOTE — Care Management Note (Signed)
Case Management Note  Patient Details  Name: [name] MRN: [id number] Date of Birth: 07/19/1926  Subjective/Objective:         CM following for progression and d/c planning.           Action/Plan: 02/27/2016 Noted plan to d/c pt back to SNF when mental status improved. No HH or DME needs at this time.   Expected Discharge Date:  02/28/16               Expected Discharge Plan:  [place]  In-House Referral:  Clinical Social Work  Discharge planning Services  NA  Post Acute Care Choice:  NA Choice offered to:  NA  DME Arranged:   NA DME Agency:   NA  HH Arranged:   NA HH Agency:   NA  Status of Service:  Completed, signed off  Medicare Important Message Given:  Yes Date Medicare IM Given:    Medicare IM give by:    Date Additional Medicare IM Given:    Additional Medicare Important Message give by:     If discussed at [place] of Stay Meetings, dates discussed:    Additional Comments:  Adron Bene, RN 02/27/2016, 12:54 PM

## 2016-02-27 NOTE — Evaluation (Signed)
Physical Therapy Evaluation Patient Details Name: [name] MRN: [id number] DOB: [birth year] Today's Date: 02/27/2016   History of Present Illness  82 year old female with a history of COPD, dementia, CVA with left hemiparesis, chronic respiratory failure on 2 L nasal cannula, schizophrenia presented from [facility] secondary to acute mental status change. Acute encehalopathy secondary to UTI  Clinical Impression   Pt admitted with above diagnosis. Pt currently with functional limitations due to the deficits listed below (see PT Problem List).  Pt will benefit from skilled PT to increase their independence and safety with mobility to allow discharge to the venue listed below.    Will follow on a trial basis, depending on PLOF, ability to participate, and progress.     Follow Up Recommendations SNF    Equipment Recommendations  None recommended by PT    Recommendations for Other Services       Precautions / Restrictions Precautions Precautions: Fall      Mobility  Bed Mobility Overal bed mobility: +2 for physical assistance;Needs Assistance Bed Mobility: Supine to Sit     Supine to sit: +2 for physical assistance;Max assist     General bed mobility comments: needing +2 physical assist for all aspects of bed mobility  Transfers Overall transfer level: Needs assistance Equipment used: 2 person hand held assist (and bed pad to cradle hips) Transfers: Lateral/Scoot Transfers          Lateral/Scoot Transfers: +2 physical assistance;Max assist General transfer comment: Lateral scoot transfer to Recliner with drop-arm down placed on pt's R side; Required bilateral knee blocking for stability  Ambulation/Gait                Stairs            Wheelchair Mobility    Modified Rankin (Stroke Patients Only)       Balance Overall balance assessment: Needs assistance Sitting-balance support: Feet supported;Bilateral upper extremity supported Sitting  balance-Leahy Scale: Zero Sitting balance - Comments: posterior lean Postural control: Posterior lean                                   Pertinent Vitals/Pain Pain Assessment: Faces Faces Pain Scale: Hurts a little bit Pain Location: General; Grimace likely related to effort Pain Descriptors / Indicators: Grimacing Pain Intervention(s): Monitored during session    Home Living Family/patient expects to be discharged to:: Skilled nursing facility                      Prior Function Level of Independence: Needs assistance         Comments: Pt unable to give information re: PLOF     Hand Dominance        Extremity/Trunk Assessment   Upper Extremity Assessment: Generalized weakness (LUE with hemiparesis due to stroke)           Lower Extremity Assessment: RLE deficits/detail;LLE deficits/detail RLE Deficits / Details: hip flexion 2/5; able to activate quad, and dorsiflex/plantarflex through most of range; no weight bearing response to center of mass being shifted over feet dueing transfer LLE Deficits / Details: hip flexion 2/5; able to activate quad, and dorsiflex/plantarflex through most of range; no weight bearing response to center of mass being shifted over feet dueing transfer     Communication   Communication: Receptive difficulties;Expressive difficulties (slow to answer questions)  Cognition Arousal/Alertness: Awake/alert Behavior During Therapy: [facility] for tasks assessed/performed  Overall Cognitive Status: No family/caregiver present to determine baseline cognitive functioning                      General Comments      Exercises        Assessment/Plan    PT Assessment Patient needs continued PT services (depending upon PLOF, ability to participate,  and progress)  PT Diagnosis Generalized weakness   PT Problem List Decreased strength;Decreased range of motion;Decreased activity tolerance;Decreased balance;Decreased  mobility;Decreased coordination;Decreased cognition  PT Treatment Interventions Functional mobility training;Therapeutic activities;Therapeutic exercise;Balance training;Neuromuscular re-education;Cognitive remediation;Patient/family education   PT Goals (Current goals can be found in the Care Plan section) Acute Rehab PT Goals Patient Stated Goal: did not state PT Goal Formulation: Patient unable to participate in goal setting Time For Goal Achievement: 03/12/16 Potential to Achieve Goals: Fair    Frequency Min 2X/week (depending upon ability to participate adn progress)   Barriers to discharge        Co-evaluation               End of Session Equipment Utilized During Treatment: Other (comment);Oxygen (bed pad to cradle hips during transfer) Activity Tolerance: Patient tolerated treatment well Patient left: in chair;with call bell/phone within reach;with chair alarm set Nurse Communication: Mobility status;Need for lift equipment         Time: [id number] PT Time Calculation (min) (ACUTE ONLY): 24 min   Charges:   PT Evaluation $PT Eval Moderate Complexity: 1 Procedure PT Treatments $Therapeutic Activity: 8-22 mins   PT G Codes:        Quin Hoop 02/27/2016, 4:29 PM  Roney Marion, [place] Pager [phone] Office [phone]

## 2016-02-27 NOTE — Progress Notes (Signed)
PROGRESS NOTE  [name] [id number] DOB: 1926/07/09 DOA: 02/24/2016 PCP: Blanchie Serve, MD  Brief History:  82 year old female with a history of COPD, dementia, CVA with left hemiparesis, chronic respiratory failure on 2 L nasal cannula, schizophrenia presented from [facility] secondary to acute mental status change. The patient was recently discharged from the hospital on 01/23/2016 after treatment for hemorrhagic cystitis. She was discharged on Cefpodoxime. Recently, the patient was placed on ciprofloxacin after cystoscopy by her urologist, Dr. Sharin Grave, on 02/20/2016. Apparently, the patient was given a seven-day course of the ciprofloxacin on 02/20/2016. Upon presentation, the patient was afebrile and hemodynamically stable but confused. The patient was started on cefepime due to concerns for resistant organisms. Suprapubic catheter was changed on 02/20/2016.  Assessment/Plan: Acute encephalopathy -Secondary to UTI -Significantly improved on intravenous antibiotics -initially showed improvement, but more somnolent on 6/13 due to ESBL organism--susceptibility results not finalized till 6/13 -d/c cefepime, start imipenem  Spinal stenosis  -This has resulted in the patient's neurogenic bladder -The patient is essentially bedbound  UTI--E Coli--ESBL -discontinue cefepime -start imipenem -if improves, can likely go home on Bactrim DS -Suprapubic catheter last changed 02/20/2016  COPD (chronic obstructive pulmonary disease) / Chronic respiratory failure with hypoxia  - cont nebs and O2 -stable on 2L  Chronic schizophrenia  - cont home meds- stable  Essential hypertension, benign - Norvasc and Hydralazine  Vascular dementia - on Aricept  CVA - left sided is weak- left arm contracted- -restart ASA 81 mg daily  Diabetes mellitus, type II, controlled -01/21/16--Hemoglobin A1c 6.0 -Remain off medications  B12 deficiency -Continue oral  supplementation   Disposition Plan: SNF 6/14 Family Communication: Daughter updated at beside 6/13  Code Status: FULL  Subjective: Patient is more somnolent today but awake and answering questions appropriately. Denies any fevers, chills, headache, chest pain, short of breath, abdominal pain. No nausea or vomiting.  Objective: Filed Vitals:   02/26/16 2023 02/27/16 0443 02/27/16 0859 02/27/16 0926  BP: 133/60 152/71 143/90   Pulse: 86 71 76   Temp: 98.6 F (37 C) 98.4 F (36.9 C) 98.5 F (36.9 C)   TempSrc: Oral Oral Oral   Resp: 17 16 16    Weight: 98.657 kg (217 lb 8 oz)     SpO2: 100% 100% 100% 98%    Intake/Output Summary (Last 24 hours) at 02/27/16 1129 Last data filed at 02/27/16 [id number]  Gross per 24 hour  Intake   2110 ml  Output    900 ml  Net   1210 ml   Weight change: -1.996 kg (-4 lb 6.4 oz) Exam:   General:  Pt is alert, follows commands appropriately, not in acute distress  HEENT: No icterus, No thrush, No neck mass, [place]/AT  Cardiovascular: RRR, S1/S2, no rubs, no gallops  Respiratory: CTA bilaterally, no wheezing, no crackles, no rhonchi  Abdomen: Soft/+BS, non tender, non distended, no guarding  Extremities: No edema, No lymphangitis, No petechiae, No rashes, no synovitis   Data Reviewed: I have personally reviewed following labs and imaging studies Basic Metabolic Panel:  Recent Labs Lab 02/24/16 2055 02/25/16 0424  NA 141 141  K 3.4* 3.8  CL 110 109  CO2 26 25  GLUCOSE 104* 97  BUN 13 12  CREATININE 0.67 0.61  CALCIUM 9.7 9.8   Liver Function Tests:  Recent Labs Lab 02/24/16 2055  AST 13*  ALT 10*  ALKPHOS 68  BILITOT 0.5  PROT 6.2*  ALBUMIN 3.1*   No results for input(s): LIPASE, AMYLASE in the last 168 hours. No results for input(s): AMMONIA in the last 168 hours. Coagulation Profile: No results for input(s): INR, PROTIME in the last 168 hours. CBC:  Recent Labs Lab 02/24/16 2055 02/25/16 0424  WBC 5.9 6.9   NEUTROABS 3.6  --   HGB 9.8* 10.4*  HCT 32.9* 35.7*  MCV 86.4 87.3  PLT 158 162   Cardiac Enzymes:  Recent Labs Lab 02/24/16 2055  TROPONINI <0.03   BNP: Invalid input(s): POCBNP CBG:  Recent Labs Lab 02/25/16 0254  GLUCAP 99   HbA1C: No results for input(s): HGBA1C in the last 72 hours. Urine analysis:    Component Value Date/Time   COLORURINE YELLOW 02/24/2016 2315   COLORURINE Yellow 02/08/2014 1200   APPEARANCEUR TURBID* 02/24/2016 2315   APPEARANCEUR Cloudy 02/08/2014 1200   LABSPEC 1.023 02/24/2016 2315   LABSPEC 1.014 02/08/2014 1200   PHURINE 6.5 02/24/2016 2315   PHURINE 8.0 02/08/2014 1200   GLUCOSEU NEGATIVE 02/24/2016 2315   GLUCOSEU Negative 02/08/2014 1200   HGBUR SMALL* 02/24/2016 2315   HGBUR 2+ 02/08/2014 1200   BILIRUBINUR NEGATIVE 02/24/2016 2315   BILIRUBINUR Negative 02/08/2014 1200   KETONESUR 15* 02/24/2016 2315   KETONESUR Negative 02/08/2014 1200   PROTEINUR 30* 02/24/2016 2315   PROTEINUR Negative 02/08/2014 1200   UROBILINOGEN 0.2 12/21/2013 1132   NITRITE POSITIVE* 02/24/2016 2315   NITRITE Negative 02/08/2014 1200   LEUKOCYTESUR LARGE* 02/24/2016 2315   LEUKOCYTESUR 2+ 02/08/2014 1200   Sepsis Labs: @LABRCNTIP (procalcitonin:4,lacticidven:4) ) Recent Results (from the past 240 hour(s))  Blood Culture (routine x 2)     Status: None (Preliminary result)   Collection Time: 02/24/16  8:55 PM  Result Value Ref Range Status   Specimen Description BLOOD RIGHT ARM  Final   Special Requests BOTTLES DRAWN AEROBIC AND ANAEROBIC 5CC   Final   Culture NO GROWTH 1 DAY  Final   Report Status PENDING  Incomplete  Blood Culture (routine x 2)     Status: None (Preliminary result)   Collection Time: 02/24/16  9:06 PM  Result Value Ref Range Status   Specimen Description BLOOD LEFT HAND  Final   Special Requests BOTTLES DRAWN AEROBIC ONLY [place]  Final   Culture NO GROWTH 1 DAY  Final   Report Status PENDING  Incomplete  Urine culture      Status: Abnormal   Collection Time: 02/24/16 11:15 PM  Result Value Ref Range Status   Specimen Description URINE, RANDOM  Final   Special Requests NONE  Final   Culture (A)  Final    >=100,000 COLONIES/mL ESCHERICHIA COLI Confirmed Extended Spectrum Beta-Lactamase Producer (ESBL)    Report Status 02/27/2016 FINAL  Final   Organism ID, Bacteria ESCHERICHIA COLI (A)  Final      Susceptibility   Escherichia coli - MIC*    AMPICILLIN >=32 RESISTANT Resistant     CEFAZOLIN >=64 RESISTANT Resistant     CEFTRIAXONE >=64 RESISTANT Resistant     CIPROFLOXACIN >=4 RESISTANT Resistant     GENTAMICIN <=1 SENSITIVE Sensitive     IMIPENEM <=0.25 SENSITIVE Sensitive     NITROFURANTOIN <=16 SENSITIVE Sensitive     TRIMETH/SULFA <=20 SENSITIVE Sensitive     AMPICILLIN/SULBACTAM >=32 RESISTANT Resistant     PIP/TAZO <=4 SENSITIVE Sensitive     * >=100,000 COLONIES/mL ESCHERICHIA COLI  MRSA PCR Screening     Status: None   Collection Time: 02/25/16  3:38 AM  Result Value Ref Range Status   MRSA by PCR NEGATIVE NEGATIVE Final    Comment:        The GeneXpert MRSA Assay (FDA approved for NASAL specimens only), is one component of a comprehensive MRSA colonization surveillance program. It is not intended to diagnose MRSA infection nor to guide or monitor treatment for MRSA infections.      Scheduled Meds: . albuterol  2.5 mg Nebulization TID  . amLODipine  5 mg Oral Daily  . aspirin  81 mg Oral Daily  . budesonide  0.5 mg Nebulization BID  . cholecalciferol  2,000 Units Oral Daily  . cycloSPORINE  1 drop Both Eyes BID  . donepezil  10 mg Oral QHS  . enoxaparin (LOVENOX) injection  40 mg Subcutaneous Daily  . estradiol  1 Applicatorful Vaginal Once per day on Mon Thu  . fluticasone  2 spray Each Nare Daily  . hydrALAZINE  25 mg Oral BID  . imipenem-cilastatin  500 mg Intravenous Q8H  . mirabegron ER  25 mg Oral Daily  . olopatadine  1 drop Both Eyes BID  . polyvinyl alcohol  1 drop  Both Eyes BID  . saccharomyces boulardii  250 mg Oral BID  . sertraline  75 mg Oral Daily  . vitamin B-12  500 mcg Oral Daily   Continuous Infusions: . sodium chloride 75 mL/hr at 02/27/16 0246    Procedures/Studies: Dg Chest 2 View  02/24/2016  CLINICAL DATA:  82 year old female with confusion. EXAM: CHEST  2 VIEW COMPARISON:  Chest radiograph dated 02/17/2016 FINDINGS: Two views of the chest demonstrate bibasilar atelectatic changes/scarring versus less likely infiltrates. There is chronic bronchiectatic changes. There is no pleural effusion or pneumothorax. Stable top-normal cardiac size. The aorta is tortuous. No acute osseous pathology. IMPRESSION: Bibasilar atelectasis/ scarring versus less likely infiltrate. Electronically Signed   By: Anner Crete M.D.   On: 02/24/2016 21:35   Dg Chest 2 View  02/17/2016  CLINICAL DATA:  Wheezing EXAM: CHEST  2 VIEW COMPARISON:  04/29/2014 FINDINGS: Chronic cardiomegaly and aortic tortuosity. These changes are accentuated by rightward rotation. Chronic scarring and mild volume loss at the left base. Chronic bronchitic markings. There is no edema, consolidation, effusion, or pneumothorax. IMPRESSION: Chronic bronchitic markings and left basilar scarring. No acute superimposed finding. Electronically Signed   By: Monte Fantasia M.D.   On: 02/17/2016 23:36   Ct Head Wo Contrast  02/24/2016  CLINICAL DATA:  82 year old female with altered mental status. History of diabetes and dementia. EXAM: CT HEAD WITHOUT CONTRAST TECHNIQUE: Contiguous axial images were obtained from the base of the skull through the vertex without intravenous contrast. COMPARISON:  Head CT dated 12/21/2013 FINDINGS: The ventricles are dilated and the sulci are prominent compatible with age-related atrophy. Periventricular and deep white matter hypodensities represent chronic microvascular ischemic changes. There is no intracranial hemorrhage. No mass effect or midline shift identified.  The visualized paranasal sinuses and mastoid air cells are well aerated. The calvarium is intact. There is hyperostosis frontalis. IMPRESSION: No acute intracranial hemorrhage. Age-related atrophy and chronic microvascular ischemic disease. If symptoms persist and there are no contraindications, MRI may provide better evaluation if clinically indicated. Electronically Signed   By: Anner Crete M.D.   On: 02/24/2016 23:38    Rabia Argote, DO  [facility] Pager [phone]  If 7PM-7AM, please contact night-coverage [email or web address] Password [email or web address] 02/27/2016, 11:29 AM   LOS: 2 days

## 2016-02-27 NOTE — Progress Notes (Signed)
Received call from Infection Prevention. Patient placed on contact precaution due to ESBL in urine.

## 2016-02-27 NOTE — Care Management Important Message (Signed)
Important Message  Patient Details  Name: [name] MRN: [id number] Date of Birth: Jan 25, 1926   Medicare Important Message Given:  Yes    Loann Quill 02/27/2016, 8:55 AM

## 2016-02-28 ENCOUNTER — Inpatient Hospital Stay (HOSPITAL_COMMUNITY): Payer: Medicare Other

## 2016-02-28 DIAGNOSIS — Z1612 Extended spectrum beta lactamase (ESBL) resistance: Secondary | ICD-10-CM

## 2016-02-28 DIAGNOSIS — R1909 Other intra-abdominal and pelvic swelling, mass and lump: Secondary | ICD-10-CM

## 2016-02-28 DIAGNOSIS — A499 Bacterial infection, unspecified: Secondary | ICD-10-CM

## 2016-02-28 MED ORDER — POLYETHYLENE GLYCOL 3350 17 G PO PACK
17.0000 g | PACK | Freq: Every day | ORAL | Status: DC
  Administered 2016-02-28 – 2016-02-29 (×2): 17 g via ORAL
  Filled 2016-02-28 (×2): qty 1

## 2016-02-28 MED ORDER — SENNA 8.6 MG PO TABS: 1.0000 | ORAL_TABLET | Freq: Every day | ORAL | Status: DC | PRN

## 2016-02-28 MED ORDER — SULFAMETHOXAZOLE-TRIMETHOPRIM 800-160 MG PO TABS
1.0000 | ORAL_TABLET | Freq: Two times a day (BID) | ORAL | Status: DC
  Administered 2016-02-28 – 2016-02-29 (×3): 1 via ORAL
  Filled 2016-02-28 (×3): qty 1

## 2016-02-28 NOTE — Care Management Important Message (Signed)
Important Message  Patient Details  Name: [name] MRN: [id number] Date of Birth: [birth year]   Medicare Important Message Given:  Yes    Fusaye Wachtel, Rory Percy, RN 02/28/2016, 1:04 PM

## 2016-02-28 NOTE — Progress Notes (Signed)
PROGRESS NOTE                                                                                                                                                                                                             Patient Demographics:    [name], is a 82 y.o. female, DOB - February 29, 1926, [id number]  Admit date - 02/24/2016   Admitting Physician Norval Morton, MD  Outpatient Primary MD for the patient is Blanchie Serve, MD  LOS - 3  Outpatient Specialists: NONE  Chief Complaint  Patient presents with  . Altered Mental Status       Brief Narrative   81 year old female with history of COPD, vascular dementia, CVA with left hemiparesis, bedbound with spinal stenosis, chronic respiratory failure on 2 L via nasal cannula, schizophrenia presented from [facility] with acute change in mental status. Patient was discharged from the hospital on 01/23/2016 after treated for hemorrhagic cystitis and discharged on on Cefpodoxime. Recently, the patient was placed on ciprofloxacin after cystoscopy by her urologist, Dr. Sharin Grave, on 02/20/2016. Apparently, the patient was given a seven-day course of the ciprofloxacin on 02/20/2016. Upon presentation, the patient was afebrile and hemodynamically stable but confused. The patient was started on cefepime due to concerns for resistant organisms. Suprapubic catheter was changed on 02/20/2016.     Subjective:   Patient appears less interactive today and has abdominal distention.   Assessment  & Plan :    Principal Problem:   Encephalopathy acute Secondary to UTI. Showing improvement with IV antibiotics. As per daughter at bedside patient is more somnolent and less interactive since yesterday. Cultures growing ESBL and antibiotic switched to imipenem. Will switch her to oral Bactrim to complete 2 weeks course of antibiotics. Mental status improving can be discharged to skilled nursing  facility tomorrow.  ESBL UTI Started on imipenem on 6/13. Since her mentation hasn't improved significantly in the past 24 hours and risk for lowering seizure threshold with imipenem will switch antibiotic to oral Bactrim. Plan to treat for 2 weeks course.  Active Problems: COPD with chronic respiratory failure Continue home O2 and nebs.  Chronic schizophrenia Continue home medications  Essential hypertension Continue home blood pressure medications.  Type 2 diabetes mellitus, controlled Last A1c of 6. Monitor on sliding scale coverage.   B12 deficiency Continue oral supplement.  History  of CVA with residual hemiparesis and vascular dementia Continue aspirin and Aricept.   Vascular dementia  Abdominal distention and swelling over left groin Abdominal x-ray shows non-gaseous distention. Last bowel movement was 3 days ago. Will order daily MiraLAX and when necessary senna. Daughter also defies a lump in her left groin (that she has noticed for last several days when changing her diaper and is tender). Appears to be lymph node on exam. Will obtain pelvic ultrasound to visualize better.     Code Status : Full code  Family Communication  : Daughter at bedside  Disposition Plan  : To skilled nursing facility tomorrow if mental status improves.  Barriers For Discharge : Encephalopathy. Pending pelvic ultrasound  Consults  :  None  Procedures  : None  DVT Prophylaxis  :  Lovenox -   Lab Results  Component Value Date   PLT PLATELET CLUMPS NOTED ON SMEAR, UNABLE TO ESTIMATE 02/27/2016    Antibiotics  :    Anti-infectives    Start     Dose/Rate Route Frequency Ordered Stop   02/27/16 1400  imipenem-cilastatin (PRIMAXIN) 500 mg in sodium chloride 0.9 % 100 mL IVPB     500 mg 200 mL/hr over 30 Minutes Intravenous Every 8 hours 02/27/16 1128     02/25/16 1000  ceFEPIme (MAXIPIME) 1 g in dextrose 5 % 50 mL IVPB  Status:  Discontinued     1 g 100 mL/hr over 30 Minutes  Intravenous Every 12 hours 02/24/16 2049 02/27/16 1128   02/24/16 2100  ceFEPIme (MAXIPIME) 2 g in dextrose 5 % 50 mL IVPB     2 g 100 mL/hr over 30 Minutes Intravenous  Once 02/24/16 2048 02/24/16 2300        Objective:   Filed Vitals:   02/27/16 2102 02/28/16 0256 02/28/16 0617 02/28/16 0757  BP: 147/63  143/68 142/84  Pulse: 86  69 74  Temp: 98.3 F (36.8 C)  97.5 F (36.4 C) 97.6 F (36.4 C)  TempSrc: Oral  Oral Oral  Resp: 14  14 15   Weight:  99.338 kg (219 lb)    SpO2: 98%  100% 98%    Wt Readings from Last 3 Encounters:  02/28/16 99.338 kg (219 lb)  02/08/16 96.072 kg (211 lb 12.8 oz)  01/29/16 96.843 kg (213 lb 8 oz)     Intake/Output Summary (Last 24 hours) at 02/28/16 1519 Last data filed at 02/28/16 1004  Gross per 24 hour  Intake   2300 ml  Output   2501 ml  Net   -201 ml     Physical Exam  Gen: not in distress, less interactive HEENT:  moist mucosa, supple neck Chest: clear b/l, no added sounds CVS: N S1&S2, no murmurs, rubs or gallop GI: soft, NT, distended, BS+, suprapubic catheter, palpable? Mass measuring 3X1.5 cm over left groin, tender Musculoskeletal: warm, no edema CNS: Awake and alert to commands and answering simple questions but becomes somnolent shortly. Left-sided hemiparesis    Data Review:    CBC  Recent Labs Lab 02/24/16 2055 02/25/16 0424 02/27/16 1229  WBC 5.9 6.9 6.1  HGB 9.8* 10.4* 10.6*  HCT 32.9* 35.7* 35.1*  PLT 158 162 PLATELET CLUMPS NOTED ON SMEAR, UNABLE TO ESTIMATE  MCV 86.4 87.3 85.8  MCH 25.7* 25.4* 25.9*  MCHC 29.8* 29.1* 30.2  RDW 17.0* 16.9* 17.0*  LYMPHSABS 1.9  --   --   MONOABS 0.4  --   --   EOSABS 0.1  --   --  BASOSABS 0.0  --   --     Chemistries   Recent Labs Lab 02/24/16 2055 02/25/16 0424 02/27/16 1229  NA 141 141 140  K 3.4* 3.8 4.2  CL 110 109 110  CO2 26 25 22   GLUCOSE 104* 97 107*  BUN 13 12 8   CREATININE 0.67 0.61 0.42*  CALCIUM 9.7 9.8 9.5  AST 13*  --   --   ALT  10*  --   --   ALKPHOS 68  --   --   BILITOT 0.5  --   --    ------------------------------------------------------------------------------------------------------------------ No results for input(s): CHOL, HDL, LDLCALC, TRIG, CHOLHDL, LDLDIRECT in the last 72 hours.  Lab Results  Component Value Date   HGBA1C 6.0* 01/21/2016   ------------------------------------------------------------------------------------------------------------------ No results for input(s): TSH, T4TOTAL, T3FREE, THYROIDAB in the last 72 hours.  Invalid input(s): FREET3 ------------------------------------------------------------------------------------------------------------------ No results for input(s): VITAMINB12, FOLATE, FERRITIN, TIBC, IRON, RETICCTPCT in the last 72 hours.  Coagulation profile No results for input(s): INR, PROTIME in the last 168 hours.  No results for input(s): DDIMER in the last 72 hours.  Cardiac Enzymes  Recent Labs Lab 02/24/16 2055  TROPONINI <0.03   ------------------------------------------------------------------------------------------------------------------ No results found for: BNP  Inpatient Medications  Scheduled Meds: . amLODipine  5 mg Oral Daily  . aspirin  81 mg Oral Daily  . budesonide  0.5 mg Nebulization BID  . cholecalciferol  2,000 Units Oral Daily  . cycloSPORINE  1 drop Both Eyes BID  . donepezil  10 mg Oral QHS  . enoxaparin (LOVENOX) injection  40 mg Subcutaneous Daily  . estradiol  1 Applicatorful Vaginal Once per day on Mon Thu  . fluticasone  2 spray Each Nare Daily  . hydrALAZINE  25 mg Oral BID  . imipenem-cilastatin  500 mg Intravenous Q8H  . mirabegron ER  25 mg Oral Daily  . olopatadine  1 drop Both Eyes BID  . polyvinyl alcohol  1 drop Both Eyes BID  . saccharomyces boulardii  250 mg Oral BID  . sertraline  75 mg Oral Daily  . vitamin B-12  500 mcg Oral Daily   Continuous Infusions:  PRN Meds:.acetaminophen **OR**  acetaminophen, albuterol, ondansetron **OR** ondansetron (ZOFRAN) IV, oxybutynin  Micro Results Recent Results (from the past 240 hour(s))  Blood Culture (routine x 2)     Status: None (Preliminary result)   Collection Time: 02/24/16  8:55 PM  Result Value Ref Range Status   Specimen Description BLOOD RIGHT ARM  Final   Special Requests BOTTLES DRAWN AEROBIC AND ANAEROBIC 5CC   Final   Culture NO GROWTH 2 DAYS  Final   Report Status PENDING  Incomplete  Blood Culture (routine x 2)     Status: None (Preliminary result)   Collection Time: 02/24/16  9:06 PM  Result Value Ref Range Status   Specimen Description BLOOD LEFT HAND  Final   Special Requests BOTTLES DRAWN AEROBIC ONLY [place]  Final   Culture NO GROWTH 2 DAYS  Final   Report Status PENDING  Incomplete  Urine culture     Status: Abnormal   Collection Time: 02/24/16 11:15 PM  Result Value Ref Range Status   Specimen Description URINE, RANDOM  Final   Special Requests NONE  Final   Culture (A)  Final    >=100,000 COLONIES/mL ESCHERICHIA COLI Confirmed Extended Spectrum Beta-Lactamase Producer (ESBL)    Report Status 02/27/2016 FINAL  Final   Organism ID, Bacteria ESCHERICHIA COLI (A)  Final  Susceptibility   Escherichia coli - MIC*    AMPICILLIN >=32 RESISTANT Resistant     CEFAZOLIN >=64 RESISTANT Resistant     CEFTRIAXONE >=64 RESISTANT Resistant     CIPROFLOXACIN >=4 RESISTANT Resistant     GENTAMICIN <=1 SENSITIVE Sensitive     IMIPENEM <=0.25 SENSITIVE Sensitive     NITROFURANTOIN <=16 SENSITIVE Sensitive     TRIMETH/SULFA <=20 SENSITIVE Sensitive     AMPICILLIN/SULBACTAM >=32 RESISTANT Resistant     PIP/TAZO <=4 SENSITIVE Sensitive     * >=100,000 COLONIES/mL ESCHERICHIA COLI  MRSA PCR Screening     Status: None   Collection Time: 02/25/16  3:38 AM  Result Value Ref Range Status   MRSA by PCR NEGATIVE NEGATIVE Final    Comment:        The GeneXpert MRSA Assay (FDA approved for NASAL specimens only), is one  component of a comprehensive MRSA colonization surveillance program. It is not intended to diagnose MRSA infection nor to guide or monitor treatment for MRSA infections.     Radiology Reports Dg Chest 2 View  02/24/2016  CLINICAL DATA:  82 year old female with confusion. EXAM: CHEST  2 VIEW COMPARISON:  Chest radiograph dated 02/17/2016 FINDINGS: Two views of the chest demonstrate bibasilar atelectatic changes/scarring versus less likely infiltrates. There is chronic bronchiectatic changes. There is no pleural effusion or pneumothorax. Stable top-normal cardiac size. The aorta is tortuous. No acute osseous pathology. IMPRESSION: Bibasilar atelectasis/ scarring versus less likely infiltrate. Electronically Signed   By: Anner Crete M.D.   On: 02/24/2016 21:35   Dg Chest 2 View  02/17/2016  CLINICAL DATA:  Wheezing EXAM: CHEST  2 VIEW COMPARISON:  04/29/2014 FINDINGS: Chronic cardiomegaly and aortic tortuosity. These changes are accentuated by rightward rotation. Chronic scarring and mild volume loss at the left base. Chronic bronchitic markings. There is no edema, consolidation, effusion, or pneumothorax. IMPRESSION: Chronic bronchitic markings and left basilar scarring. No acute superimposed finding. Electronically Signed   By: Monte Fantasia M.D.   On: 02/17/2016 23:36   Ct Head Wo Contrast  02/24/2016  CLINICAL DATA:  82 year old female with altered mental status. History of diabetes and dementia. EXAM: CT HEAD WITHOUT CONTRAST TECHNIQUE: Contiguous axial images were obtained from the base of the skull through the vertex without intravenous contrast. COMPARISON:  Head CT dated 12/21/2013 FINDINGS: The ventricles are dilated and the sulci are prominent compatible with age-related atrophy. Periventricular and deep white matter hypodensities represent chronic microvascular ischemic changes. There is no intracranial hemorrhage. No mass effect or midline shift identified. The visualized paranasal  sinuses and mastoid air cells are well aerated. The calvarium is intact. There is hyperostosis frontalis. IMPRESSION: No acute intracranial hemorrhage. Age-related atrophy and chronic microvascular ischemic disease. If symptoms persist and there are no contraindications, MRI may provide better evaluation if clinically indicated. Electronically Signed   By: Anner Crete M.D.   On: 02/24/2016 23:38   Dg Abd Portable 1v  02/28/2016  CLINICAL DATA:  Abdominal pain and distension today, history COPD, dementia, GERD, diabetes mellitus, CHF, chronic kidney disease, hypertension EXAM: PORTABLE ABDOMEN - 1 VIEW COMPARISON:  CT abdomen and pelvis 01/20/2016 FINDINGS: Catheter projects over pelvis/ bladder. Nonobstructive bowel gas pattern. No bowel wall thickening. Stool present in rectum. Bones demineralized. No definite urinary tract calcification. IMPRESSION: Nonobstructive bowel gas pattern. Electronically Signed   By: Lavonia Dana M.D.   On: 02/28/2016 12:28    Time Spent in minutes  25   Louellen Molder M.D on 02/28/2016  at 3:19 PM  Between 7am to 7pm - Pager - [phone]  After 7pm go to [email or web address] - password [facility]  [facility] -  Office  [phone]

## 2016-02-29 DIAGNOSIS — A499 Bacterial infection, unspecified: Secondary | ICD-10-CM | POA: Diagnosis present

## 2016-02-29 DIAGNOSIS — J9611 Chronic respiratory failure with hypoxia: Secondary | ICD-10-CM

## 2016-02-29 DIAGNOSIS — Z1612 Extended spectrum beta lactamase (ESBL) resistance: Secondary | ICD-10-CM

## 2016-02-29 DIAGNOSIS — N390 Urinary tract infection, site not specified: Principal | ICD-10-CM

## 2016-02-29 MED ORDER — SENNA 8.6 MG PO TABS: 1.0000 | ORAL_TABLET | Freq: Every day | ORAL | Status: DC | PRN

## 2016-02-29 MED ORDER — ASPIRIN 81 MG PO CHEW: 81.0000 mg | CHEWABLE_TABLET | Freq: Every day | ORAL | Status: DC

## 2016-02-29 MED ORDER — SULFAMETHOXAZOLE-TRIMETHOPRIM 800-160 MG PO TABS: 1.0000 | ORAL_TABLET | Freq: Two times a day (BID) | ORAL | Status: AC

## 2016-02-29 MED ORDER — POLYETHYLENE GLYCOL 3350 17 G PO PACK: 17.0000 g | PACK | Freq: Every day | ORAL | Status: DC

## 2016-02-29 NOTE — Progress Notes (Signed)
   02/29/16 1200  Clinical Encounter Type  Visited With Patient  Visit Type Spiritual support  Referral From Nurse  Spiritual Encounters  Spiritual Needs Prayer;Emotional  Stress Factors  Patient Stress Factors Health changes  Chaplain visit made per nursing request, provided support and prayer for continued progress after planned discharge.

## 2016-02-29 NOTE — Progress Notes (Signed)
Report given to [place] at [place].  All questions answered.

## 2016-02-29 NOTE — Discharge Summary (Addendum)
Physician Discharge Summary  [name] [id number] DOB: [birth year] DOA: 02/24/2016  PCP: Blanchie Serve, MD  Admit date: 02/24/2016 Discharge date: 02/29/2016  Admitted From: SNF ( [facility]) Disposition:  [facility]  Recommendations for Outpatient Follow-up:  Follow up with MD at SNF in 1week. Stop date of abx is 03/08/2016.  Equipment/Devices: oxygen 2L via [place]  Discharge Condition:Stable CODE STATUS:FULL Diet recommendation: Heart Healthy / Carb Modified / Regular / Dysphagia    Discharge Diagnoses:  Principal Problem:   Encephalopathy acute ( toxic metabolic)  Active Problems:   Chronic respiratory failure with hypoxia (HCC)   Neurogenic dysfunction of the urinary bladder   B12 deficiency   Type 2 diabetes mellitus with diabetic neuropathy, without long-term current use of insulin (HCC)   Urinary tract infection   ESBL (extended spectrum beta-lactamase) producing bacteria infection   Brief/Interim Summary: 82 year old female with history of COPD, vascular dementia, CVA with left hemiparesis, bedbound with spinal stenosis, chronic respiratory failure on 2 L via nasal cannula, schizophrenia presented from [facility] with acute change in mental status. Patient was discharged from the hospital on 01/23/2016 after treated for hemorrhagic cystitis and discharged on on Cefpodoxime. Recently, the patient was placed on ciprofloxacin after cystoscopy by her urologist, Dr. Sharin Grave, on 02/20/2016. Apparently, the patient was given a seven-day course of the ciprofloxacin on 02/20/2016. Upon presentation, the patient was afebrile and hemodynamically stable but confused. The patient was started on cefepime due to concerns for resistant organisms. Suprapubic catheter was changed on 02/20/2016.  Principal Problem:  Encephalopathy acute Secondary to UTI. Showing improvement with IV antibiotics.  Cultures growing ESBL and antibiotic switched to imipenem. Now  switched abx to oral Bactrim  to complete 2 weeks course of antibiotics. Mental status improving can be discharged to skilled nursing facility.  ESBL UTI Started on imipenem on 6/13.  switched to oral bactrim with plan to treat for 2 weeks course.  Active Problems: COPD with chronic respiratory failure Continue home O2 and nebs.  Chronic schizophrenia Continue home medications  Essential hypertension Continue home blood pressure medications.  Type 2 diabetes mellitus, controlled Last A1c of 6. stable  B12 deficiency Continue oral supplement.  History of CVA with residual hemiparesis and vascular dementia Continue Aricept. Added baby aspirin.    Abdominal distention and swelling over left groin Abdominal x-ray shows non-gaseous distention. Has irregular bowel movements. ordered daily MiraLAX and when necessary senna.  Chronic suprapubic catheter Follow with her urologist Dr Pilar Jarvis every month for catheter change.     Family Communication : spoke with Daughter on phone  Disposition Plan : To skilled nursing facility   Consults : None  Procedures :  US abdomen CT head   Discharge Instructions     Medication List  Start taking these medications sulfamethoxazole-trimethoprim 800-160 MG tablet  Commonly known as:  BACTRIM DS,SEPTRA DS  Take 1 tablet by mouth every 12 (twelve) hours.  Aspirin 81 mg tablet Take 1 tablet by mouth daily     TAKE these medications        acetaminophen 500 MG tablet  Commonly known as:  TYLENOL  Take 500 mg by mouth every 8 (eight) hours as needed for mild pain or fever.     albuterol (2.5 MG/3ML) 0.083% nebulizer solution  Commonly known as:  PROVENTIL  Take 2.5 mg by nebulization 3 (three) times daily.     amLODipine 5 MG tablet  Commonly known as:  NORVASC  Take 5 mg by mouth daily.  budesonide 0.5 MG/2ML nebulizer solution  Commonly known as:  PULMICORT  Take 0.5 mg by nebulization 2 (two) times daily.     cycloSPORINE 0.05 %  ophthalmic emulsion  Commonly known as:  RESTASIS  Place 1 drop into both eyes 2 (two) times daily.     donepezil 10 MG tablet  Commonly known as:  ARICEPT  Take 10 mg by mouth at bedtime.     estradiol 0.1 MG/GM vaginal cream  Commonly known as:  ESTRACE  Place 1 Applicatorful vaginally 2 (two) times a week.     FLUPHENAZINE DECANOATE IJ  Inject 25 mg/mL as directed every 30 (thirty) days.     fluticasone 50 MCG/ACT nasal spray  Commonly known as:  FLONASE  Place 2 sprays into the nose daily. Reported on 11/10/2015     hydrALAZINE 25 MG tablet  Commonly known as:  APRESOLINE  Take 25 mg by mouth 2 (two) times daily.     MYRBETRIQ 25 MG Tb24 tablet  Generic drug:  mirabegron ER  Take 25 mg by mouth daily.     olopatadine 0.1 % ophthalmic solution  Commonly known as:  PATANOL  Place 1 drop into both eyes 2 (two) times daily.     oxybutynin 5 MG tablet  Commonly known as:  DITROPAN  Take 1 tablet (5 mg total) by mouth every 8 (eight) hours as needed for bladder spasms.     OXYGEN  Inhale 2 L into the lungs.     polyethylene glycol packet  Commonly known as:  MIRALAX / GLYCOLAX  Take 17 g by mouth daily.     saccharomyces boulardii 250 MG capsule  Commonly known as:  FLORASTOR  Take 250 mg by mouth 2 (two) times daily.     senna 8.6 MG Tabs tablet  Commonly known as:  SENOKOT  Take 1 tablet (8.6 mg total) by mouth daily as needed for mild constipation.     sertraline 50 MG tablet  Commonly known as:  ZOLOFT  Take 75 mg by mouth daily.        SYSTANE BALANCE OP  Place 1 drop into both eyes 2 (two) times daily.     vitamin B-12 500 MCG tablet  Commonly known as:  CYANOCOBALAMIN  Take 500 mcg by mouth daily.     Vitamin D3 2000 units Tabs  Take 2,000 Units by mouth daily. For vitamin D deficiency           Follow-up Information    Please follow up.   Why:  MD at SNF within 1 week     Allergies  Allergen Reactions  . Codeine     Documented on MAR   . Lisinopril     Per MAR leg weakness        Procedures/Studies: Dg Chest 2 View  02/24/2016  CLINICAL DATA:  82 year old female with confusion. EXAM: CHEST  2 VIEW COMPARISON:  Chest radiograph dated 02/17/2016 FINDINGS: Two views of the chest demonstrate bibasilar atelectatic changes/scarring versus less likely infiltrates. There is chronic bronchiectatic changes. There is no pleural effusion or pneumothorax. Stable top-normal cardiac size. The aorta is tortuous. No acute osseous pathology. IMPRESSION: Bibasilar atelectasis/ scarring versus less likely infiltrate. Electronically Signed   By: Anner Crete M.D.   On: 02/24/2016 21:35   Dg Chest 2 View  02/17/2016  CLINICAL DATA:  Wheezing EXAM: CHEST  2 VIEW COMPARISON:  04/29/2014 FINDINGS: Chronic cardiomegaly and aortic tortuosity. These changes are accentuated by rightward rotation.  Chronic scarring and mild volume loss at the left base. Chronic bronchitic markings. There is no edema, consolidation, effusion, or pneumothorax. IMPRESSION: Chronic bronchitic markings and left basilar scarring. No acute superimposed finding. Electronically Signed   By: Marnee Spring M.D.   On: 02/17/2016 23:36   Ct Head Wo Contrast  02/24/2016  CLINICAL DATA:  82 year old female with altered mental status. History of diabetes and dementia. EXAM: CT HEAD WITHOUT CONTRAST TECHNIQUE: Contiguous axial images were obtained from the base of the skull through the vertex without intravenous contrast. COMPARISON:  Head CT dated 12/21/2013 FINDINGS: The ventricles are dilated and the sulci are prominent compatible with age-related atrophy. Periventricular and deep white matter hypodensities represent chronic microvascular ischemic changes. There is no intracranial hemorrhage. No mass effect or midline shift identified. The visualized paranasal sinuses and mastoid air cells are well aerated. The calvarium is intact. There is hyperostosis frontalis. IMPRESSION: No acute  intracranial hemorrhage. Age-related atrophy and chronic microvascular ischemic disease. If symptoms persist and there are no contraindications, MRI may provide better evaluation if clinically indicated. Electronically Signed   By: Elgie Collard M.D.   On: 02/24/2016 23:38   US Pelvis Limited  02/28/2016  CLINICAL DATA:  82 year old female inpatient with left groin tenderness/ lump reportedly noticed by patient's daughter while changing the patient. EXAM: LIMITED ULTRASOUND OF PELVIS TECHNIQUE: Limited transabdominal ultrasound examination of the pelvis was performed. COMPARISON:  01/20/2016 CT abdomen/pelvis. FINDINGS: Targeted ultrasound of the area of clinical concern demonstrates no suspicious mass or fluid collection. A sonographically benign 1.1 cm left inguinal lymph node is demonstrated, with no hyperemia or nodal cortex thickening. IMPRESSION: No abnormal mass, adenopathy or fluid collection demonstrated in the left inguinal region. Please note that review of 01/20/2016 and 12/21/2013 CT abdomen/ pelvis studies demonstrates asymmetric fat in the left inguinal canal, unchanged back to 12/21/2013, consistent with either a chronic fat-containing left inguinal hernia or left inguinal canal lipoma, which was incompletely evaluated on both of those CT studies. If there is clinical concern for a progressive process in this region, a dedicated CT pelvis with IV contrast could be obtained for further evaluation. Electronically Signed   By: Delbert Phenix M.D.   On: 02/28/2016 19:06   Dg Abd Portable 1v  02/28/2016  CLINICAL DATA:  Abdominal pain and distension today, history COPD, dementia, GERD, diabetes mellitus, CHF, chronic kidney disease, hypertension EXAM: PORTABLE ABDOMEN - 1 VIEW COMPARISON:  CT abdomen and pelvis 01/20/2016 FINDINGS: Catheter projects over pelvis/ bladder. Nonobstructive bowel gas pattern. No bowel wall thickening. Stool present in rectum. Bones demineralized. No definite urinary  tract calcification. IMPRESSION: Nonobstructive bowel gas pattern. Electronically Signed   By: Ulyses Southward M.D.   On: 02/28/2016 12:28    (Echo, Carotid, EGD, Colonoscopy, ERCP)    Subjective:   Discharge Exam: Filed Vitals:   02/29/16 0444 02/29/16 0822  BP: 106/44 133/59  Pulse: 71 71  Temp: 98.2 F (36.8 C) 98.1 F (36.7 C)  Resp: 20 18   Filed Vitals:   02/28/16 2029 02/29/16 0444 02/29/16 0822 02/29/16 0918  BP: 134/73 106/44 133/59   Pulse: 79 71 71   Temp: 98.5 F (36.9 C) 98.2 F (36.8 C) 98.1 F (36.7 C)   TempSrc: Oral Oral Oral   Resp: Weight: 100.835 kg (222 lb 4.8 oz)     SpO2: 100% 99% 100% 99%     Gen: not in distress HEENT: moist mucosa, supple neck Chest: clear b/l, no  added sounds CVS: N S1&S2, no murmurs, rubs or gallop GI: soft, NT, non distended, BS+, suprapubic catheter,  Musculoskeletal: warm, no edema CNS: AAO X1-2  The results of significant diagnostics from this hospitalization (including imaging, microbiology, ancillary and laboratory) are listed below for reference.     Microbiology: Recent Results (from the past 240 hour(s))  Blood Culture (routine x 2)     Status: None (Preliminary result)   Collection Time: 02/24/16  8:55 PM  Result Value Ref Range Status   Specimen Description BLOOD RIGHT ARM  Final   Special Requests BOTTLES DRAWN AEROBIC AND ANAEROBIC 5CC   Final   Culture NO GROWTH 3 DAYS  Final   Report Status PENDING  Incomplete  Blood Culture (routine x 2)     Status: None (Preliminary result)   Collection Time: 02/24/16  9:06 PM  Result Value Ref Range Status   Specimen Description BLOOD LEFT HAND  Final   Special Requests BOTTLES DRAWN AEROBIC ONLY 6CC  Final   Culture NO GROWTH 3 DAYS  Final   Report Status PENDING  Incomplete  Urine culture     Status: Abnormal   Collection Time: 02/24/16 11:15 PM  Result Value Ref Range Status   Specimen Description URINE, RANDOM  Final   Special Requests NONE   Final   Culture (A)  Final    >=100,000 COLONIES/mL ESCHERICHIA COLI Confirmed Extended Spectrum Beta-Lactamase Producer (ESBL)    Report Status 02/27/2016 FINAL  Final   Organism ID, Bacteria ESCHERICHIA COLI (A)  Final      Susceptibility   Escherichia coli - MIC*    AMPICILLIN >=32 RESISTANT Resistant     CEFAZOLIN >=64 RESISTANT Resistant     CEFTRIAXONE >=64 RESISTANT Resistant     CIPROFLOXACIN >=4 RESISTANT Resistant     GENTAMICIN <=1 SENSITIVE Sensitive     IMIPENEM <=0.25 SENSITIVE Sensitive     NITROFURANTOIN <=16 SENSITIVE Sensitive     TRIMETH/SULFA <=20 SENSITIVE Sensitive     AMPICILLIN/SULBACTAM >=32 RESISTANT Resistant     PIP/TAZO <=4 SENSITIVE Sensitive     * >=100,000 COLONIES/mL ESCHERICHIA COLI  MRSA PCR Screening     Status: None   Collection Time: 02/25/16  3:38 AM  Result Value Ref Range Status   MRSA by PCR NEGATIVE NEGATIVE Final    Comment:        The GeneXpert MRSA Assay (FDA approved for NASAL specimens only), is one component of a comprehensive MRSA colonization surveillance program. It is not intended to diagnose MRSA infection nor to guide or monitor treatment for MRSA infections.      Labs: BNP (last 3 results) No results for input(s): BNP in the last 8760 hours. Basic Metabolic Panel:  Recent Labs Lab 02/24/16 2055 02/25/16 0424 02/27/16 1229  NA 141 141 140  K 3.4* 3.8 4.2  CL 110 109 110  CO2 26 25 22   GLUCOSE 104* 97 107*  BUN 13 12 8   CREATININE 0.67 0.61 0.42*  CALCIUM 9.7 9.8 9.5   Liver Function Tests:  Recent Labs Lab 02/24/16 2055  AST 13*  ALT 10*  ALKPHOS 68  BILITOT 0.5  PROT 6.2*  ALBUMIN 3.1*   No results for input(s): LIPASE, AMYLASE in the last 168 hours. No results for input(s): AMMONIA in the last 168 hours. CBC:  Recent Labs Lab 02/24/16 2055 02/25/16 0424 02/27/16 1229  WBC 5.9 6.9 6.1  NEUTROABS 3.6  --   --   HGB 9.8* 10.4* 10.6*  HCT 32.9* 35.7* 35.1*  MCV 86.4 87.3 85.8  PLT  158 162 PLATELET CLUMPS NOTED ON SMEAR, UNABLE TO ESTIMATE   Cardiac Enzymes:  Recent Labs Lab 02/24/16 2055  TROPONINI <0.03   BNP: Invalid input(s): POCBNP CBG:  Recent Labs Lab 02/25/16 0254  GLUCAP 99   D-Dimer No results for input(s): DDIMER in the last 72 hours. Hgb A1c No results for input(s): HGBA1C in the last 72 hours. Lipid Profile No results for input(s): CHOL, HDL, LDLCALC, TRIG, CHOLHDL, LDLDIRECT in the last 72 hours. Thyroid function studies No results for input(s): TSH, T4TOTAL, T3FREE, THYROIDAB in the last 72 hours.  Invalid input(s): FREET3 Anemia work up No results for input(s): VITAMINB12, FOLATE, FERRITIN, TIBC, IRON, RETICCTPCT in the last 72 hours. Urinalysis    Component Value Date/Time   COLORURINE YELLOW 02/24/2016 2315   COLORURINE Yellow 02/08/2014 1200   APPEARANCEUR TURBID* 02/24/2016 2315   APPEARANCEUR Cloudy 02/08/2014 1200   LABSPEC 1.023 02/24/2016 2315   LABSPEC 1.014 02/08/2014 1200   PHURINE 6.5 02/24/2016 2315   PHURINE 8.0 02/08/2014 1200   GLUCOSEU NEGATIVE 02/24/2016 2315   GLUCOSEU Negative 02/08/2014 1200   HGBUR SMALL* 02/24/2016 2315   HGBUR 2+ 02/08/2014 1200   BILIRUBINUR NEGATIVE 02/24/2016 2315   BILIRUBINUR Negative 02/08/2014 1200   KETONESUR 15* 02/24/2016 2315   KETONESUR Negative 02/08/2014 1200   PROTEINUR 30* 02/24/2016 2315   PROTEINUR Negative 02/08/2014 1200   UROBILINOGEN 0.2 12/21/2013 1132   NITRITE POSITIVE* 02/24/2016 2315   NITRITE Negative 02/08/2014 1200   LEUKOCYTESUR LARGE* 02/24/2016 2315   LEUKOCYTESUR 2+ 02/08/2014 1200   Sepsis Labs Invalid input(s): PROCALCITONIN,  WBC,  LACTICIDVEN Microbiology Recent Results (from the past 240 hour(s))  Blood Culture (routine x 2)     Status: None (Preliminary result)   Collection Time: 02/24/16  8:55 PM  Result Value Ref Range Status   Specimen Description BLOOD RIGHT ARM  Final   Special Requests BOTTLES DRAWN AEROBIC AND ANAEROBIC 5CC    Final   Culture NO GROWTH 3 DAYS  Final   Report Status PENDING  Incomplete  Blood Culture (routine x 2)     Status: None (Preliminary result)   Collection Time: 02/24/16  9:06 PM  Result Value Ref Range Status   Specimen Description BLOOD LEFT HAND  Final   Special Requests BOTTLES DRAWN AEROBIC ONLY 6CC  Final   Culture NO GROWTH 3 DAYS  Final   Report Status PENDING  Incomplete  Urine culture     Status: Abnormal   Collection Time: 02/24/16 11:15 PM  Result Value Ref Range Status   Specimen Description URINE, RANDOM  Final   Special Requests NONE  Final   Culture (A)  Final    >=100,000 COLONIES/mL ESCHERICHIA COLI Confirmed Extended Spectrum Beta-Lactamase Producer (ESBL)    Report Status 02/27/2016 FINAL  Final   Organism ID, Bacteria ESCHERICHIA COLI (A)  Final      Susceptibility   Escherichia coli - MIC*    AMPICILLIN >=32 RESISTANT Resistant     CEFAZOLIN >=64 RESISTANT Resistant     CEFTRIAXONE >=64 RESISTANT Resistant     CIPROFLOXACIN >=4 RESISTANT Resistant     GENTAMICIN <=1 SENSITIVE Sensitive     IMIPENEM <=0.25 SENSITIVE Sensitive     NITROFURANTOIN <=16 SENSITIVE Sensitive     TRIMETH/SULFA <=20 SENSITIVE Sensitive     AMPICILLIN/SULBACTAM >=32 RESISTANT Resistant     PIP/TAZO <=4 SENSITIVE Sensitive     * >=100,000 COLONIES/mL ESCHERICHIA  COLI  MRSA PCR Screening     Status: None   Collection Time: 02/25/16  3:38 AM  Result Value Ref Range Status   MRSA by PCR NEGATIVE NEGATIVE Final    Comment:        The GeneXpert MRSA Assay (FDA approved for NASAL specimens only), is one component of a comprehensive MRSA colonization surveillance program. It is not intended to diagnose MRSA infection nor to guide or monitor treatment for MRSA infections.      Time coordinating discharge: Over 30 minutes  SIGNED:   Louellen Molder, MD  [facility] 02/29/2016, 12:35 PM Pager   If 7PM-7AM, please contact night-coverage [email or web address] Password  [email or web address]

## 2016-02-29 NOTE — Clinical Social Work Note (Signed)
Patient medically stable for discharge back to [facility] today. Discharge information transmitted to facility and patient will be transported by ambulance. Daughter [name] contacted by phone and informed of d/c and ambulance transport.  Mykelti Goldenstein Givens, MSW, LCSW Licensed Clinical Social Worker [place] [phone]

## 2016-03-01 ENCOUNTER — Encounter: Payer: Self-pay | Admitting: Internal Medicine

## 2016-03-01 ENCOUNTER — Non-Acute Institutional Stay (SKILLED_NURSING_FACILITY): Payer: Medicare Other | Admitting: Internal Medicine

## 2016-03-01 DIAGNOSIS — K5900 Constipation, unspecified: Secondary | ICD-10-CM | POA: Diagnosis not present

## 2016-03-01 DIAGNOSIS — D638 Anemia in other chronic diseases classified elsewhere: Secondary | ICD-10-CM | POA: Diagnosis not present

## 2016-03-01 DIAGNOSIS — I10 Essential (primary) hypertension: Secondary | ICD-10-CM | POA: Diagnosis not present

## 2016-03-01 DIAGNOSIS — E119 Type 2 diabetes mellitus without complications: Secondary | ICD-10-CM

## 2016-03-01 DIAGNOSIS — A499 Bacterial infection, unspecified: Secondary | ICD-10-CM

## 2016-03-01 DIAGNOSIS — F329 Major depressive disorder, single episode, unspecified: Secondary | ICD-10-CM | POA: Diagnosis not present

## 2016-03-01 DIAGNOSIS — J9611 Chronic respiratory failure with hypoxia: Secondary | ICD-10-CM

## 2016-03-01 DIAGNOSIS — Z1612 Extended spectrum beta lactamase (ESBL) resistance: Secondary | ICD-10-CM

## 2016-03-01 DIAGNOSIS — R531 Weakness: Secondary | ICD-10-CM | POA: Diagnosis not present

## 2016-03-01 DIAGNOSIS — N319 Neuromuscular dysfunction of bladder, unspecified: Secondary | ICD-10-CM

## 2016-03-01 DIAGNOSIS — I6930 Unspecified sequelae of cerebral infarction: Secondary | ICD-10-CM | POA: Insufficient documentation

## 2016-03-01 DIAGNOSIS — J449 Chronic obstructive pulmonary disease, unspecified: Secondary | ICD-10-CM | POA: Diagnosis not present

## 2016-03-01 DIAGNOSIS — F0150 Vascular dementia without behavioral disturbance: Secondary | ICD-10-CM | POA: Diagnosis not present

## 2016-03-01 DIAGNOSIS — F209 Schizophrenia, unspecified: Secondary | ICD-10-CM

## 2016-03-01 DIAGNOSIS — K5909 Other constipation: Secondary | ICD-10-CM | POA: Insufficient documentation

## 2016-03-01 LAB — CULTURE, BLOOD (ROUTINE X 2)
CULTURE: NO GROWTH
Culture: NO GROWTH

## 2016-03-01 NOTE — Progress Notes (Signed)
Patient ID: [name], female   DOB: [birth year], 82 y.o.   MRN: [id number]      LOCATION: Facility: [facility]    PCP: Blanchie Serve, MD   Code Status: full code   Goals of care: Advanced Directive information Advanced Directives 03/01/2016  Does patient have an advance directive? No  Would patient like information on creating an advanced directive? -       Extended Emergency Contact Information Primary Emergency Contact: Stabenow-Dean,Sharone Address: [address] [place], [place] [address] [address] [place] of [place] Phone: [phone] Mobile Phone: [phone] Relation: Daughter Secondary Emergency Contact: Bullock,Janet Address: [place], [place] [address] [address] [place] of [place] Phone: [phone] Mobile Phone: [phone] Relation: Daughter   Allergies  Allergen Reactions  . Codeine     Documented on MAR  . Lisinopril     Per MAR leg weakness    Chief Complaint  Patient presents with  . Readmit To SNF    following hospitalization 02/24/16 to 02/29/16 Encephalopathy acute     HPI:  Patient is a 82 y.o. female seen today for long term care post hospital re-admission from 02/24/16-02/29/16 with acute encephalopathy with ESBL UTI. She was started on iv antibiotics and later switched to oral antibiotics. She made clinical improvement and is now back to the facility for continuation of long term care.  She has medical history of COPD, CVA with left hemiparesis, vascular dementia, chronic respiratory failure, neurogenic bladder with suprapubic catheter among others. She is seen in her room today.   Review of Systems:  Constitutional: Negative for fever, chills, diaphoresis.  HENT: Negative for headache, congestion, difficulty swallowing.   Eyes: Negative for blurred vision, double vision and discharge.  Respiratory: Negative for cough, shortness of breath and wheezing.   Cardiovascular: Negative for  chest pain, palpitations, leg swelling.  Gastrointestinal: Negative for heartburn, nausea, vomiting, abdominal pain. Genitourinary: Negative for flank pain.  Musculoskeletal: Negative forfall.  Skin: Negative for itching, rash.  Neurological: Negative for dizziness. Psychiatric/Behavioral: Negative for depression   Past Medical History  Diagnosis Date  . Anemia   . Arthritis   . COPD (chronic obstructive pulmonary disease) ([place])   . Chronic schizophrenia ([place])   . Dementia   . Depressive disorder   . Acute sinusitis   . Schizophrenia ([place])   . Osteoporosis   . Stenosis of lumbosacral spine   . Glaucoma   . On home oxygen therapy     2 L PER NASAL CANNULA  . GERD (gastroesophageal reflux disease)   . Stroke (H[facility])     L hemiparesis  . Cervical spondylosis   . DM (diabetes mellitus) ([place])     DIET CONTROLLED  . Chronic kidney disease     chronic kidney disease per MD notes  . CHF (congestive heart failure) (H[facility])     chronic and stable per MD note  . Hyperlipidemia   . Hypertension   . Foley catheter in place   . Wheelchair dependent   . Breast cancer [facility])    Past Surgical History  Procedure Laterality Date  . Appendectomy    . Cataract extraction, bilateral    . Cholecystectomy    . Uterine suspension    . Joint replacement      L TOTAL KNEE  . Breast surgery      breast cancer  . Insertion  of suprapubic catheter N/A 04/29/2014    Procedure: INSERTION OF SUPRAPUBIC CATHETER;  Surgeon: Arvil Persons, MD;  Location: [facility];  Service: Urology;  Laterality: N/A;   Social History:   reports that she quit smoking about 18 years ago. Her smoking use included Cigarettes. She has a 15 pack-year smoking history. She does not have any smokeless tobacco history on file. She reports that she does not drink alcohol or use illicit drugs.  Family History  Problem Relation Age of Onset  . Asthma Daughter   . Heart disease Sister     3 sisters  . Heart disease Brother     x 2    . Cancer Brother     multiple myloma    Medications:   Medication List       This list is accurate as of: 03/01/16  1:03 PM.  Always use your most recent med list.               acetaminophen 500 MG tablet  Commonly known as:  TYLENOL  Take 500 mg by mouth every 8 (eight) hours as needed for mild pain or fever.     albuterol (2.5 MG/3ML) 0.083% nebulizer solution  Commonly known as:  PROVENTIL  Take 2.5 mg by nebulization 3 (three) times daily.     amLODipine 5 MG tablet  Commonly known as:  NORVASC  Take 5 mg by mouth daily.     aspirin 81 MG chewable tablet  Chew 1 tablet (81 mg total) by mouth daily.     budesonide 0.5 MG/2ML nebulizer solution  Commonly known as:  PULMICORT  Take 0.5 mg by nebulization 2 (two) times daily.     cycloSPORINE 0.05 % ophthalmic emulsion  Commonly known as:  RESTASIS  Place 1 drop into both eyes 2 (two) times daily.     donepezil 10 MG tablet  Commonly known as:  ARICEPT  Take 10 mg by mouth at bedtime.     estradiol 0.1 MG/GM vaginal cream  Commonly known as:  ESTRACE  Place 1 Applicatorful vaginally 2 (two) times a week.     FLUPHENAZINE DECANOATE IJ  Inject 25 mg/mL as directed every 30 (thirty) days.     fluticasone 50 MCG/ACT nasal spray  Commonly known as:  FLONASE  Place 2 sprays into the nose daily. Reported on 11/10/2015     hydrALAZINE 25 MG tablet  Commonly known as:  APRESOLINE  Take 25 mg by mouth 2 (two) times daily.     MYRBETRIQ 25 MG Tb24 tablet  Generic drug:  mirabegron ER  Take 25 mg by mouth daily.     olopatadine 0.1 % ophthalmic solution  Commonly known as:  PATANOL  Place 1 drop into both eyes 2 (two) times daily.     oxybutynin 5 MG tablet  Commonly known as:  DITROPAN  Take 1 tablet (5 mg total) by mouth every 8 (eight) hours as needed for bladder spasms.     OXYGEN  Inhale 2 L into the lungs.     polyethylene glycol packet  Commonly known as:  MIRALAX / GLYCOLAX  Take 17 g by mouth  daily.     saccharomyces boulardii 250 MG capsule  Commonly known as:  FLORASTOR  Take 250 mg by mouth 2 (two) times daily.     senna 8.6 MG Tabs tablet  Commonly known as:  SENOKOT  Take 1 tablet (8.6 mg total) by mouth daily as needed for mild constipation.  sertraline 50 MG tablet  Commonly known as:  ZOLOFT  Take 75 mg by mouth daily.     sulfamethoxazole-trimethoprim 800-160 MG tablet  Commonly known as:  BACTRIM DS,SEPTRA DS  Take 1 tablet by mouth every 12 (twelve) hours.     SYSTANE BALANCE OP  Place 1 drop into both eyes 2 (two) times daily.     vitamin B-12 500 MCG tablet  Commonly known as:  CYANOCOBALAMIN  Take 500 mcg by mouth daily.     Vitamin D3 2000 units Tabs  Take 2,000 Units by mouth daily. For vitamin D deficiency        Immunizations: Immunization History  Administered Date(s) Administered  . Influenza Split 06/16/2013  . Influenza-Unspecified 06/23/2014, 07/04/2015  . PPD Test 12/23/2013, 01/09/2014, 05/25/2015  . Pneumococcal Polysaccharide-23 09/16/2002  . Pneumococcal-Unspecified 09/15/2001     Physical Exam: Filed Vitals:   03/01/16 0839  BP: 136/77  Pulse: 88  Temp: 97 F (36.1 C)  Resp: 18  Height: 5\' 9"  (1.753 m)  Weight: 222 lb (100.699 kg)  SpO2: 99%   Body mass index is 32.77 kg/(m^2).   General- elderly obese female in no acute distress Head- atraumatic, normocephalic Eyes- no pallor, no icterus, no discharge Cardiovascular- normal s1,s2, no murmur, trace leg edema Respiratory- bilateral clear to auscultation, no wheeze, no rhonchi, no crackles Abdomen- bowel sounds present, soft, non tender, suprapubic catheter in place and site clean Musculoskeletal- left hemiplegia with left hand contracted, left 4th and 5th finger contracted, has a splint in place, arthritis changes in digits, hoyer transfer, diminished pedal pulses Neurological- alert and oriented to person Psychiatry- normal mood and affect    Labs  reviewed: Basic Metabolic Panel:  Recent Labs  01/21/16 0428  02/24/16 2055 02/25/16 0424 02/27/16 1229  NA 143  < > 141 141 140  K 3.2*  < > 3.4* 3.8 4.2  CL 107  < > 110 109 110  CO2 27  < > 26 25 22   GLUCOSE 115*  < > 104* 97 107*  BUN 8  < > 13 12 8   CREATININE 0.59  < > 0.67 0.61 0.42*  CALCIUM 9.8  < > 9.7 9.8 9.5  MG 1.7  --   --   --   --   PHOS 3.0  --   --   --   --   < > = values in this interval not displayed. Liver Function Tests:  Recent Labs  01/20/16 1900 01/21/16 0428 02/24/16 2055  AST 21 14* 13*  ALT 12* 12* 10*  ALKPHOS 85 82 68  BILITOT 0.4 0.5 0.5  PROT 6.8 6.8 6.2*  ALBUMIN 3.5 3.6 3.1*   No results for input(s): LIPASE, AMYLASE in the last 8760 hours. No results for input(s): AMMONIA in the last 8760 hours. CBC:  Recent Labs  01/27/16 2028 02/17/16 2239 02/24/16 2055 02/25/16 0424 02/27/16 1229  WBC 8.7 8.6 5.9 6.9 6.1  NEUTROABS 6.2 5.7 3.6  --   --   HGB 10.8* 11.6* 9.8* 10.4* 10.6*  HCT 35.1* 37.7 32.9* 35.7* 35.1*  MCV 86.9 86.3 86.4 87.3 85.8  PLT 194 188 158 162 PLATELET CLUMPS NOTED ON SMEAR, UNABLE TO ESTIMATE   Cardiac Enzymes:  Recent Labs  02/24/16 2055  TROPONINI <0.03   BNP: Invalid input(s): POCBNP CBG:  Recent Labs  01/21/16 1709 01/21/16 2209 02/25/16 0254  GLUCAP 119* 129* 99    Radiological Exams: Dg Chest 2 View  02/24/2016  CLINICAL  DATA:  82 year old female with confusion. EXAM: CHEST  2 VIEW COMPARISON:  Chest radiograph dated 02/17/2016 FINDINGS: Two views of the chest demonstrate bibasilar atelectatic changes/scarring versus less likely infiltrates. There is chronic bronchiectatic changes. There is no pleural effusion or pneumothorax. Stable top-normal cardiac size. The aorta is tortuous. No acute osseous pathology. IMPRESSION: Bibasilar atelectasis/ scarring versus less likely infiltrate. Electronically Signed   By: Anner Crete M.D.   On: 02/24/2016 21:35   Dg Chest 2 View  02/17/2016   CLINICAL DATA:  Wheezing EXAM: CHEST  2 VIEW COMPARISON:  04/29/2014 FINDINGS: Chronic cardiomegaly and aortic tortuosity. These changes are accentuated by rightward rotation. Chronic scarring and mild volume loss at the left base. Chronic bronchitic markings. There is no edema, consolidation, effusion, or pneumothorax. IMPRESSION: Chronic bronchitic markings and left basilar scarring. No acute superimposed finding. Electronically Signed   By: Monte Fantasia M.D.   On: 02/17/2016 23:36   Ct Head Wo Contrast  02/24/2016  CLINICAL DATA:  82 year old female with altered mental status. History of diabetes and dementia. EXAM: CT HEAD WITHOUT CONTRAST TECHNIQUE: Contiguous axial images were obtained from the base of the skull through the vertex without intravenous contrast. COMPARISON:  Head CT dated 12/21/2013 FINDINGS: The ventricles are dilated and the sulci are prominent compatible with age-related atrophy. Periventricular and deep white matter hypodensities represent chronic microvascular ischemic changes. There is no intracranial hemorrhage. No mass effect or midline shift identified. The visualized paranasal sinuses and mastoid air cells are well aerated. The calvarium is intact. There is hyperostosis frontalis. IMPRESSION: No acute intracranial hemorrhage. Age-related atrophy and chronic microvascular ischemic disease. If symptoms persist and there are no contraindications, MRI may provide better evaluation if clinically indicated. Electronically Signed   By: Anner Crete M.D.   On: 02/24/2016 23:38   US Pelvis Limited  02/28/2016  CLINICAL DATA:  82 year old female inpatient with left groin tenderness/ lump reportedly noticed by patient's daughter while changing the patient. EXAM: LIMITED ULTRASOUND OF PELVIS TECHNIQUE: Limited transabdominal ultrasound examination of the pelvis was performed. COMPARISON:  01/20/2016 CT abdomen/pelvis. FINDINGS: Targeted ultrasound of the area of clinical concern  demonstrates no suspicious mass or fluid collection. A sonographically benign 1.1 cm left inguinal lymph node is demonstrated, with no hyperemia or nodal cortex thickening. IMPRESSION: No abnormal mass, adenopathy or fluid collection demonstrated in the left inguinal region. Please note that review of 01/20/2016 and 12/21/2013 CT abdomen/ pelvis studies demonstrates asymmetric fat in the left inguinal canal, unchanged back to 12/21/2013, consistent with either a chronic fat-containing left inguinal hernia or left inguinal canal lipoma, which was incompletely evaluated on both of those CT studies. If there is clinical concern for a progressive process in this region, a dedicated CT pelvis with IV contrast could be obtained for further evaluation. Electronically Signed   By: Ilona Sorrel M.D.   On: 02/28/2016 19:06   Dg Abd Portable 1v  02/28/2016  CLINICAL DATA:  Abdominal pain and distension today, history COPD, dementia, GERD, diabetes mellitus, CHF, chronic kidney disease, hypertension EXAM: PORTABLE ABDOMEN - 1 VIEW COMPARISON:  CT abdomen and pelvis 01/20/2016 FINDINGS: Catheter projects over pelvis/ bladder. Nonobstructive bowel gas pattern. No bowel wall thickening. Stool present in rectum. Bones demineralized. No definite urinary tract calcification. IMPRESSION: Nonobstructive bowel gas pattern. Electronically Signed   By: Lavonia Dana M.D.   On: 02/28/2016 12:28    Assessment/Plan  Generalized weakness From deconditioning in setting of infection. To complete her course of antibiotics. Will have patient  work with PT/OT as tolerated to regain strength and restore function.  Fall precautions are in place.  ESBL UTI Continue contact precautions. Continue and complete bactrim ds 1 tab q12h on 03/08/16. Hydration to be maintained. Continue suprapubic catheter care  Neurogenic bladder Continue monthly change of suprapubic catheter and daily bladder irrigation. Continue myrbetriq, to follow with  urology  CVA with residual effect Continue aspirin 81 mg daily for now. Not on statin. Check lipid panel.  Lipid Panel     Component Value Date/Time   CHOL 251* 12/25/2015   TRIG 193* 12/25/2015   HDL 36 12/25/2015   LDLCALC 176 12/25/2015   Hypertension Continue amlodipine 5 mg daily, hydralazine 25 mg bid  Chronic respiratory failure On o2, stable. Continue albuterol nebulizer tid with pulmicort  COPD Stable breathing, as above  Dementia Likely from vascular (hx of CVA) and neurodegenerative disorder (alzhimer's). Continue supportive care. Continue aricept for now  Schizophrenia Stable, followed by psych services, continue fluphenazine  Constipation Continue daily miralax with senokot daily as needed  Chronic depression Continue zoloft 75 mg daily and monitor  Type 2 DM Lab Results  Component Value Date   HGBA1C 6.0* 01/21/2016   Off oral hypoglycemic agent, diet controlled for now, monitor  Anemia of chronic disease Monitor cbc   Labs- cbc,cmp, lipid panel  Family/ staff Communication: reviewed care plan with patient and nursing supervisor   Goals of care: long term care    Blanchie Serve, MD Internal Medicine [place], [place] [address] Cell Phone (Monday-Friday 8 am - 5 pm): [phone] On Call: [phone] and follow prompts after 5 pm and on weekends Office Phone: [phone] Office Fax: [phone]

## 2016-03-05 LAB — BASIC METABOLIC PANEL
BUN: 15 mg/dL (ref 4–21)
CREATININE: 0.7 mg/dL (ref 0.5–1.1)
Glucose: 94 mg/dL
POTASSIUM: 4.1 mmol/L (ref 3.4–5.3)
Sodium: 138 mmol/L (ref 137–147)

## 2016-03-05 LAB — CBC AND DIFFERENTIAL
HCT: 32 % — AB (ref 36–46)
Hemoglobin: 10.2 g/dL — AB (ref 12.0–16.0)
PLATELETS: 185 10*3/uL (ref 150–399)
WBC: 5.6 10^3/mL

## 2016-03-08 LAB — BASIC METABOLIC PANEL
BUN: 10 mg/dL (ref 4–21)
Creatinine: 0.7 mg/dL (ref 0.5–1.1)
GLUCOSE: 95 mg/dL
Potassium: 4.2 mmol/L (ref 3.4–5.3)
SODIUM: 142 mmol/L (ref 137–147)

## 2016-03-27 ENCOUNTER — Non-Acute Institutional Stay (SKILLED_NURSING_FACILITY): Payer: Medicare Other | Admitting: Internal Medicine

## 2016-03-27 ENCOUNTER — Encounter: Payer: Self-pay | Admitting: Internal Medicine

## 2016-03-27 DIAGNOSIS — G4719 Other hypersomnia: Secondary | ICD-10-CM | POA: Diagnosis not present

## 2016-03-27 DIAGNOSIS — E1169 Type 2 diabetes mellitus with other specified complication: Secondary | ICD-10-CM | POA: Insufficient documentation

## 2016-03-27 DIAGNOSIS — E119 Type 2 diabetes mellitus without complications: Secondary | ICD-10-CM | POA: Diagnosis not present

## 2016-03-27 DIAGNOSIS — I10 Essential (primary) hypertension: Secondary | ICD-10-CM | POA: Diagnosis not present

## 2016-03-27 DIAGNOSIS — E669 Obesity, unspecified: Secondary | ICD-10-CM | POA: Diagnosis not present

## 2016-03-27 DIAGNOSIS — N319 Neuromuscular dysfunction of bladder, unspecified: Secondary | ICD-10-CM

## 2016-03-27 NOTE — Progress Notes (Signed)
Patient ID: [name], female   DOB: 1926-06-01, 82 y.o.   MRN: [id number]      LOCATION: Facility: [facility]    PCP: Blanchie Serve, MD   Code Status: Full Code   Goals of care: Advanced Directive information Advanced Directives 03/01/2016  Does patient have an advance directive? No  Would patient like information on creating an advanced directive? -       Extended Emergency Contact Information Primary Emergency Contact: Ellithorpe-Dean,Sharone Address: [address] [place], [place] [address] Johnnette Litter of [place] Phone: [phone] Mobile Phone: [phone] Relation: Daughter Secondary Emergency Contact: Bullock,Janet Address: [place], [place] [address] [place] of [place] Phone: [phone] Mobile Phone: [phone] Relation: Daughter   Allergies  Allergen Reactions  . Codeine     Documented on MAR  . Lisinopril     Per MAR leg weakness    Chief Complaint  Patient presents with  . Medical Management of Chronic Issues    Routine Visit     HPI:  Patient is a 82 y.o. female seen today for routine visit. She is not participating in HPI and ROS today. Per staff, she has been sleeping more during daytime. No fall reported. No skin concern.   Review of Systems:  Unable to obtain  Past Medical History  Diagnosis Date  . Anemia   . Arthritis   . COPD (chronic obstructive pulmonary disease) ([place])   . Chronic schizophrenia ([place])   . Dementia   . Depressive disorder   . Acute sinusitis   . Schizophrenia ([place])   . Osteoporosis   . Stenosis of lumbosacral spine   . Glaucoma   . On home oxygen therapy     2 L PER NASAL CANNULA  . GERD (gastroesophageal reflux disease)   . Stroke (H[facility])     L hemiparesis  . Cervical spondylosis   . DM (diabetes mellitus) ([place])     DIET CONTROLLED  . Chronic kidney disease     chronic kidney disease per MD notes  . CHF (congestive heart failure)  (HCC)     chronic and stable per MD note  . Hyperlipidemia   . Hypertension   . Foley catheter in place   . Wheelchair dependent   . Breast cancer [facility])    Past Surgical History  Procedure Laterality Date  . Appendectomy    . Cataract extraction, bilateral    . Cholecystectomy    . Uterine suspension    . Joint replacement      L TOTAL KNEE  . Breast surgery      breast cancer  . Insertion of suprapubic catheter N/A 04/29/2014    Procedure: INSERTION OF SUPRAPUBIC CATHETER;  Surgeon: Arvil Persons, MD;  Location: [facility];  Service: Urology;  Laterality: N/A;   Social History:   reports that she quit smoking about 18 years ago. Her smoking use included Cigarettes. She has a 15 pack-year smoking history. She does not have any smokeless tobacco history on file. She reports that she does not drink alcohol or use illicit drugs.  Family History  Problem Relation Age of Onset  . Asthma Daughter   . Heart disease Sister     3 sisters  . Heart disease Brother     x 2  . Cancer Brother     multiple myloma  Medications:   Medication List       This list is accurate as of: 03/27/16  2:14 PM.  Always use your most recent med list.               acetaminophen 500 MG tablet  Commonly known as:  TYLENOL  Take 500 mg by mouth every 8 (eight) hours as needed for mild pain or fever.     albuterol (2.5 MG/3ML) 0.083% nebulizer solution  Commonly known as:  PROVENTIL  Take 2.5 mg by nebulization 3 (three) times daily.     amLODipine 5 MG tablet  Commonly known as:  NORVASC  Take 5 mg by mouth daily.     aspirin 81 MG chewable tablet  Chew 1 tablet (81 mg total) by mouth daily.     budesonide 0.5 MG/2ML nebulizer solution  Commonly known as:  PULMICORT  Take 0.5 mg by nebulization 2 (two) times daily.     cycloSPORINE 0.05 % ophthalmic emulsion  Commonly known as:  RESTASIS  Place 1 drop into both eyes 2 (two) times daily.     donepezil 10 MG tablet  Commonly known as:   ARICEPT  Take 10 mg by mouth at bedtime.     estradiol 0.1 MG/GM vaginal cream  Commonly known as:  ESTRACE  Place 1 Applicatorful vaginally 2 (two) times a week.     FLUPHENAZINE DECANOATE IJ  Inject 25 mg/mL as directed every 30 (thirty) days.     fluticasone 50 MCG/ACT nasal spray  Commonly known as:  FLONASE  Place 2 sprays into the nose daily. Reported on 11/10/2015     hydrALAZINE 25 MG tablet  Commonly known as:  APRESOLINE  Take 25 mg by mouth 2 (two) times daily.     MYRBETRIQ 25 MG Tb24 tablet  Generic drug:  mirabegron ER  Take 25 mg by mouth daily.     olopatadine 0.1 % ophthalmic solution  Commonly known as:  PATANOL  Place 1 drop into both eyes 2 (two) times daily.     OXYGEN  Inhale 2 L into the lungs.     polyethylene glycol packet  Commonly known as:  MIRALAX / GLYCOLAX  Take 17 g by mouth daily.     saccharomyces boulardii 250 MG capsule  Commonly known as:  FLORASTOR  Take 250 mg by mouth 2 (two) times daily.     senna 8.6 MG Tabs tablet  Commonly known as:  SENOKOT  Take 1 tablet (8.6 mg total) by mouth daily as needed for mild constipation.     sertraline 50 MG tablet  Commonly known as:  ZOLOFT  Take 75 mg by mouth daily.     SYSTANE BALANCE OP  Place 1 drop into both eyes 2 (two) times daily.     vitamin B-12 500 MCG tablet  Commonly known as:  CYANOCOBALAMIN  Take 500 mcg by mouth daily.     Vitamin D3 2000 units Tabs  Take 2,000 Units by mouth daily. For vitamin D deficiency        Immunizations: Immunization History  Administered Date(s) Administered  . Influenza Split 06/16/2013  . Influenza-Unspecified 06/23/2014, 07/04/2015  . PPD Test 12/23/2013, 01/09/2014, 05/25/2015  . Pneumococcal Polysaccharide-23 09/16/2002  . Pneumococcal-Unspecified 09/15/2001     Physical Exam: Filed Vitals:   03/27/16 1405  BP: 132/68  Pulse: 70  Temp: 96.8 F (36 C)  TempSrc: Oral  Resp: 20  Height: 5\' 9"  (1.753 m)  Weight: 211 lb  12.8 oz (96.072 kg)  SpO2: 100%   Body mass index is 31.26 kg/(m^2).   General- elderly obese female in no acute distress Head- atraumatic, normocephalic Eyes- no pallor, no icterus, no discharge Cardiovascular- normal s1,s2, no murmur, trace leg edema Respiratory- bilateral clear to auscultation, no wheeze, no rhonchi, no crackles Abdomen- bowel sounds present, soft, non tender, suprapubic catheter in place and site clean Musculoskeletal- left hemiplegia with left hand contracted, left 4th and 5th finger contracted, has a splint in place, arthritis changes in digits, hoyer transfer, diminished pedal pulses Neurological- pleasantly confused   Labs reviewed: Basic Metabolic Panel:  Recent Labs  01/21/16 0428  02/24/16 2055 02/25/16 0424 02/27/16 1229  NA 143  < > 141 141 140  K 3.2*  < > 3.4* 3.8 4.2  CL 107  < > 110 109 110  CO2 27  < > 26 25 22   GLUCOSE 115*  < > 104* 97 107*  BUN 8  < > 13 12 8   CREATININE 0.59  < > 0.67 0.61 0.42*  CALCIUM 9.8  < > 9.7 9.8 9.5  MG 1.7  --   --   --   --   PHOS 3.0  --   --   --   --   < > = values in this interval not displayed. Liver Function Tests:  Recent Labs  01/20/16 1900 01/21/16 0428 02/24/16 2055  AST 21 14* 13*  ALT 12* 12* 10*  ALKPHOS 85 82 68  BILITOT 0.4 0.5 0.5  PROT 6.8 6.8 6.2*  ALBUMIN 3.5 3.6 3.1*   No results for input(s): LIPASE, AMYLASE in the last 8760 hours. No results for input(s): AMMONIA in the last 8760 hours. CBC:  Recent Labs  01/27/16 2028 02/17/16 2239 02/24/16 2055 02/25/16 0424 02/27/16 1229  WBC 8.7 8.6 5.9 6.9 6.1  NEUTROABS 6.2 5.7 3.6  --   --   HGB 10.8* 11.6* 9.8* 10.4* 10.6*  HCT 35.1* 37.7 32.9* 35.7* 35.1*  MCV 86.9 86.3 86.4 87.3 85.8  PLT 194 188 158 162 PLATELET CLUMPS NOTED ON SMEAR, UNABLE TO ESTIMATE   Cardiac Enzymes:  Recent Labs  02/24/16 2055  TROPONINI <0.03   BNP: Invalid input(s): POCBNP CBG:  Recent Labs  01/21/16 1709 01/21/16 2209  02/25/16 0254  GLUCAP 119* 129* 99    Radiological Exams: US Pelvis Limited  02/28/2016  CLINICAL DATA:  82 year old female inpatient with left groin tenderness/ lump reportedly noticed by patient's daughter while changing the patient. EXAM: LIMITED ULTRASOUND OF PELVIS TECHNIQUE: Limited transabdominal ultrasound examination of the pelvis was performed. COMPARISON:  01/20/2016 CT abdomen/pelvis. FINDINGS: Targeted ultrasound of the area of clinical concern demonstrates no suspicious mass or fluid collection. A sonographically benign 1.1 cm left inguinal lymph node is demonstrated, with no hyperemia or nodal cortex thickening. IMPRESSION: No abnormal mass, adenopathy or fluid collection demonstrated in the left inguinal region. Please note that review of 01/20/2016 and 12/21/2013 CT abdomen/ pelvis studies demonstrates asymmetric fat in the left inguinal canal, unchanged back to 12/21/2013, consistent with either a chronic fat-containing left inguinal hernia or left inguinal canal lipoma, which was incompletely evaluated on both of those CT studies. If there is clinical concern for a progressive process in this region, a dedicated CT pelvis with IV contrast could be obtained for further evaluation. Electronically Signed   By: Ilona Sorrel M.D.   On: 02/28/2016 19:06   Dg Abd Portable 1v  02/28/2016  CLINICAL DATA:  Abdominal pain  and distension today, history COPD, dementia, GERD, diabetes mellitus, CHF, chronic kidney disease, hypertension EXAM: PORTABLE ABDOMEN - 1 VIEW COMPARISON:  CT abdomen and pelvis 01/20/2016 FINDINGS: Catheter projects over pelvis/ bladder. Nonobstructive bowel gas pattern. No bowel wall thickening. Stool present in rectum. Bones demineralized. No definite urinary tract calcification. IMPRESSION: Nonobstructive bowel gas pattern. Electronically Signed   By: Lavonia Dana M.D.   On: 02/28/2016 12:28    Assessment/Plan  Increased daytime sleepiness Decrease sertraline to 50 mg  daily and send u/a with c/s to rule out UTI. Check cbc with diff and cmp to rule out infection  Neurogenic bladder Continue monthly change of suprapubic catheter and daily bladder irrigation. Continue myrbetriq, to follow with urology  Hypertension Continue amlodipine 5 mg daily, hydralazine 25 mg bid and monitor BP.   DM Lab Results  Component Value Date   HGBA1C 6.0* 01/21/2016   Diet controlled. uptodate with foot and eye exam. Send urine for microalbumin.    Blanchie Serve, MD Internal Medicine [facility] [address] [place], [place] [address] Cell Phone (Monday-Friday 8 am - 5 pm): [phone] On Call: [phone] and follow prompts after 5 pm and on weekends Office Phone: [phone] Office Fax: [phone]

## 2016-03-28 LAB — HEPATIC FUNCTION PANEL
ALK PHOS: 85 U/L (ref 25–125)
ALT: 7 U/L (ref 7–35)
AST: 10 U/L — AB (ref 13–35)
BILIRUBIN, TOTAL: 0.3 mg/dL

## 2016-03-28 LAB — CBC AND DIFFERENTIAL
HEMATOCRIT: 35 % — AB (ref 36–46)
Hemoglobin: 10.4 g/dL — AB (ref 12.0–16.0)
PLATELETS: 202 10*3/uL (ref 150–399)
WBC: 5.9 10*3/mL

## 2016-03-28 LAB — BASIC METABOLIC PANEL
BUN: 12 mg/dL (ref 4–21)
Creatinine: 0.7 mg/dL (ref 0.5–1.1)
Glucose: 98 mg/dL
Potassium: 4.1 mmol/L (ref 3.4–5.3)
Sodium: 140 mmol/L (ref 137–147)

## 2016-03-28 LAB — MICROALBUMIN, URINE: Microalb, Ur: 29.4

## 2016-04-17 ENCOUNTER — Encounter (HOSPITAL_COMMUNITY): Payer: Self-pay | Admitting: Emergency Medicine

## 2016-04-17 ENCOUNTER — Emergency Department (HOSPITAL_COMMUNITY): Payer: Medicare Other

## 2016-04-17 ENCOUNTER — Inpatient Hospital Stay (HOSPITAL_COMMUNITY)
Admission: EM | Admit: 2016-04-17 | Discharge: 2016-04-19 | DRG: 689 | Disposition: A | Discharge status: Skilled Nursing Facility | Payer: Medicare Other | Attending: Internal Medicine | Admitting: Internal Medicine

## 2016-04-17 DIAGNOSIS — Z8249 Family history of ischemic heart disease and other diseases of the circulatory system: Secondary | ICD-10-CM

## 2016-04-17 DIAGNOSIS — E669 Obesity, unspecified: Secondary | ICD-10-CM | POA: Diagnosis present

## 2016-04-17 DIAGNOSIS — Z825 Family history of asthma and other chronic lower respiratory diseases: Secondary | ICD-10-CM

## 2016-04-17 DIAGNOSIS — I129 Hypertensive chronic kidney disease with stage 1 through stage 4 chronic kidney disease, or unspecified chronic kidney disease: Secondary | ICD-10-CM | POA: Diagnosis present

## 2016-04-17 DIAGNOSIS — N319 Neuromuscular dysfunction of bladder, unspecified: Secondary | ICD-10-CM | POA: Diagnosis present

## 2016-04-17 DIAGNOSIS — J9611 Chronic respiratory failure with hypoxia: Secondary | ICD-10-CM | POA: Diagnosis present

## 2016-04-17 DIAGNOSIS — Z885 Allergy status to narcotic agent status: Secondary | ICD-10-CM

## 2016-04-17 DIAGNOSIS — M810 Age-related osteoporosis without current pathological fracture: Secondary | ICD-10-CM | POA: Diagnosis present

## 2016-04-17 DIAGNOSIS — K219 Gastro-esophageal reflux disease without esophagitis: Secondary | ICD-10-CM | POA: Diagnosis present

## 2016-04-17 DIAGNOSIS — F209 Schizophrenia, unspecified: Secondary | ICD-10-CM | POA: Diagnosis present

## 2016-04-17 DIAGNOSIS — Z9981 Dependence on supplemental oxygen: Secondary | ICD-10-CM

## 2016-04-17 DIAGNOSIS — Z66 Do not resuscitate: Secondary | ICD-10-CM | POA: Diagnosis present

## 2016-04-17 DIAGNOSIS — R1310 Dysphagia, unspecified: Secondary | ICD-10-CM | POA: Diagnosis present

## 2016-04-17 DIAGNOSIS — E1122 Type 2 diabetes mellitus with diabetic chronic kidney disease: Secondary | ICD-10-CM | POA: Diagnosis present

## 2016-04-17 DIAGNOSIS — B965 Pseudomonas (aeruginosa) (mallei) (pseudomallei) as the cause of diseases classified elsewhere: Secondary | ICD-10-CM | POA: Diagnosis present

## 2016-04-17 DIAGNOSIS — E1169 Type 2 diabetes mellitus with other specified complication: Secondary | ICD-10-CM

## 2016-04-17 DIAGNOSIS — E785 Hyperlipidemia, unspecified: Secondary | ICD-10-CM | POA: Diagnosis present

## 2016-04-17 DIAGNOSIS — Z853 Personal history of malignant neoplasm of breast: Secondary | ICD-10-CM

## 2016-04-17 DIAGNOSIS — Z888 Allergy status to other drugs, medicaments and biological substances status: Secondary | ICD-10-CM

## 2016-04-17 DIAGNOSIS — Z993 Dependence on wheelchair: Secondary | ICD-10-CM | POA: Diagnosis not present

## 2016-04-17 DIAGNOSIS — J9610 Chronic respiratory failure, unspecified whether with hypoxia or hypercapnia: Secondary | ICD-10-CM | POA: Diagnosis not present

## 2016-04-17 DIAGNOSIS — N189 Chronic kidney disease, unspecified: Secondary | ICD-10-CM | POA: Diagnosis present

## 2016-04-17 DIAGNOSIS — Z7401 Bed confinement status: Secondary | ICD-10-CM

## 2016-04-17 DIAGNOSIS — F329 Major depressive disorder, single episode, unspecified: Secondary | ICD-10-CM | POA: Diagnosis present

## 2016-04-17 DIAGNOSIS — Z809 Family history of malignant neoplasm, unspecified: Secondary | ICD-10-CM

## 2016-04-17 DIAGNOSIS — G9340 Encephalopathy, unspecified: Secondary | ICD-10-CM

## 2016-04-17 DIAGNOSIS — Z79899 Other long term (current) drug therapy: Secondary | ICD-10-CM

## 2016-04-17 DIAGNOSIS — I69354 Hemiplegia and hemiparesis following cerebral infarction affecting left non-dominant side: Secondary | ICD-10-CM

## 2016-04-17 DIAGNOSIS — N390 Urinary tract infection, site not specified: Secondary | ICD-10-CM | POA: Diagnosis present

## 2016-04-17 DIAGNOSIS — H409 Unspecified glaucoma: Secondary | ICD-10-CM | POA: Diagnosis present

## 2016-04-17 DIAGNOSIS — L8990 Pressure ulcer of unspecified site, unspecified stage: Secondary | ICD-10-CM | POA: Diagnosis present

## 2016-04-17 DIAGNOSIS — L89322 Pressure ulcer of left buttock, stage 2: Secondary | ICD-10-CM | POA: Insufficient documentation

## 2016-04-17 DIAGNOSIS — E538 Deficiency of other specified B group vitamins: Secondary | ICD-10-CM | POA: Diagnosis present

## 2016-04-17 DIAGNOSIS — Z7951 Long term (current) use of inhaled steroids: Secondary | ICD-10-CM

## 2016-04-17 DIAGNOSIS — G9341 Metabolic encephalopathy: Secondary | ICD-10-CM | POA: Diagnosis present

## 2016-04-17 DIAGNOSIS — R410 Disorientation, unspecified: Secondary | ICD-10-CM

## 2016-04-17 DIAGNOSIS — Z87440 Personal history of urinary (tract) infections: Secondary | ICD-10-CM

## 2016-04-17 DIAGNOSIS — Z87891 Personal history of nicotine dependence: Secondary | ICD-10-CM

## 2016-04-17 DIAGNOSIS — F0150 Vascular dementia without behavioral disturbance: Secondary | ICD-10-CM | POA: Diagnosis present

## 2016-04-17 DIAGNOSIS — J449 Chronic obstructive pulmonary disease, unspecified: Secondary | ICD-10-CM | POA: Diagnosis present

## 2016-04-17 DIAGNOSIS — Z7982 Long term (current) use of aspirin: Secondary | ICD-10-CM

## 2016-04-17 LAB — GRAM STAIN

## 2016-04-17 LAB — BLOOD GAS, ARTERIAL
Acid-Base Excess: 2.6 mmol/L — ABNORMAL HIGH (ref 0.0–2.0)
BICARBONATE: 27.5 meq/L — AB (ref 20.0–24.0)
Drawn by: 257701
O2 Content: 3 L/min
O2 Saturation: 98.3 %
PH ART: 7.393 (ref 7.350–7.450)
PO2 ART: 120 mmHg — AB (ref 80.0–100.0)
Patient temperature: 98.6
TCO2: 24.6 mmol/L (ref 0–100)
pCO2 arterial: 46.1 mmHg — ABNORMAL HIGH (ref 35.0–45.0)

## 2016-04-17 LAB — CBC WITH DIFFERENTIAL/PLATELET
BASOS ABS: 0 10*3/uL (ref 0.0–0.1)
BASOS PCT: 0 %
Eosinophils Absolute: 0.1 10*3/uL (ref 0.0–0.7)
Eosinophils Relative: 1 %
HEMATOCRIT: 36 % (ref 36.0–46.0)
Hemoglobin: 11.1 g/dL — ABNORMAL LOW (ref 12.0–15.0)
LYMPHS PCT: 21 %
Lymphs Abs: 1.7 10*3/uL (ref 0.7–4.0)
MCH: 26.6 pg (ref 26.0–34.0)
MCHC: 30.8 g/dL (ref 30.0–36.0)
MCV: 86.3 fL (ref 78.0–100.0)
MONO ABS: 0.6 10*3/uL (ref 0.1–1.0)
Monocytes Relative: 7 %
NEUTROS ABS: 5.6 10*3/uL (ref 1.7–7.7)
NEUTROS PCT: 71 %
Platelets: 189 10*3/uL (ref 150–400)
RBC: 4.17 MIL/uL (ref 3.87–5.11)
RDW: 16.8 % — AB (ref 11.5–15.5)
WBC: 8 10*3/uL (ref 4.0–10.5)

## 2016-04-17 LAB — I-STAT CG4 LACTIC ACID, ED: Lactic Acid, Venous: 0.93 mmol/L (ref 0.5–1.9)

## 2016-04-17 LAB — URINALYSIS, ROUTINE W REFLEX MICROSCOPIC
BILIRUBIN URINE: NEGATIVE
Glucose, UA: NEGATIVE mg/dL
KETONES UR: NEGATIVE mg/dL
NITRITE: NEGATIVE
PH: 6.5 (ref 5.0–8.0)
Protein, ur: NEGATIVE mg/dL
SPECIFIC GRAVITY, URINE: 1.013 (ref 1.005–1.030)

## 2016-04-17 LAB — COMPREHENSIVE METABOLIC PANEL
ALBUMIN: 3.6 g/dL (ref 3.5–5.0)
ALK PHOS: 84 U/L (ref 38–126)
ALT: 10 U/L — ABNORMAL LOW (ref 14–54)
ANION GAP: 4 — AB (ref 5–15)
AST: 16 U/L (ref 15–41)
BILIRUBIN TOTAL: 0.3 mg/dL (ref 0.3–1.2)
BUN: 14 mg/dL (ref 6–20)
CALCIUM: 9.8 mg/dL (ref 8.9–10.3)
CO2: 28 mmol/L (ref 22–32)
Chloride: 107 mmol/L (ref 101–111)
Creatinine, Ser: 0.7 mg/dL (ref 0.44–1.00)
GLUCOSE: 88 mg/dL (ref 65–99)
POTASSIUM: 4.3 mmol/L (ref 3.5–5.1)
Sodium: 139 mmol/L (ref 135–145)
TOTAL PROTEIN: 7.1 g/dL (ref 6.5–8.1)

## 2016-04-17 LAB — I-STAT TROPONIN, ED: Troponin i, poc: 0 ng/mL (ref 0.00–0.08)

## 2016-04-17 LAB — URINE MICROSCOPIC-ADD ON

## 2016-04-17 LAB — MRSA PCR SCREENING: MRSA by PCR: NEGATIVE

## 2016-04-17 MED ORDER — SODIUM CHLORIDE 0.9 % IV SOLN
INTRAVENOUS | Status: DC
  Administered 2016-04-17: 17:00:00 via INTRAVENOUS
  Administered 2016-04-18: 1000 mL via INTRAVENOUS

## 2016-04-17 MED ORDER — AMLODIPINE BESYLATE 5 MG PO TABS
5.0000 mg | ORAL_TABLET | Freq: Every day | ORAL | Status: DC
  Administered 2016-04-18 – 2016-04-19 (×2): 5 mg via ORAL
  Filled 2016-04-17 (×2): qty 1

## 2016-04-17 MED ORDER — SERTRALINE HCL 50 MG PO TABS
50.0000 mg | ORAL_TABLET | Freq: Every day | ORAL | Status: DC
  Administered 2016-04-18 – 2016-04-19 (×2): 50 mg via ORAL
  Filled 2016-04-17 (×2): qty 1

## 2016-04-17 MED ORDER — ALBUTEROL SULFATE (2.5 MG/3ML) 0.083% IN NEBU: 2.5000 mg | INHALATION_SOLUTION | RESPIRATORY_TRACT | Status: DC | PRN

## 2016-04-17 MED ORDER — ONDANSETRON HCL 4 MG PO TABS: 4.0000 mg | ORAL_TABLET | Freq: Four times a day (QID) | ORAL | Status: DC | PRN

## 2016-04-17 MED ORDER — DONEPEZIL HCL 10 MG PO TABS
10.0000 mg | ORAL_TABLET | Freq: Every day | ORAL | Status: DC
  Administered 2016-04-17 – 2016-04-18 (×2): 10 mg via ORAL
  Filled 2016-04-17 (×2): qty 1

## 2016-04-17 MED ORDER — CYCLOSPORINE 0.05 % OP EMUL
1.0000 [drp] | Freq: Two times a day (BID) | OPHTHALMIC | Status: DC
  Administered 2016-04-17 – 2016-04-19 (×4): 1 [drp] via OPHTHALMIC
  Filled 2016-04-17 (×6): qty 1

## 2016-04-17 MED ORDER — BUDESONIDE 0.5 MG/2ML IN SUSP
0.5000 mg | Freq: Two times a day (BID) | RESPIRATORY_TRACT | Status: DC
  Administered 2016-04-17 – 2016-04-19 (×4): 0.5 mg via RESPIRATORY_TRACT
  Filled 2016-04-17 (×4): qty 2

## 2016-04-17 MED ORDER — POLYVINYL ALCOHOL 1.4 % OP SOLN
Freq: Two times a day (BID) | OPHTHALMIC | Status: DC
  Administered 2016-04-17: 21:00:00 via OPHTHALMIC
  Administered 2016-04-18 – 2016-04-19 (×3): 1 [drp] via OPHTHALMIC
  Filled 2016-04-17: qty 15

## 2016-04-17 MED ORDER — CETYLPYRIDINIUM CHLORIDE 0.05 % MT LIQD
7.0000 mL | Freq: Two times a day (BID) | OROMUCOSAL | Status: DC
  Administered 2016-04-18 (×2): 7 mL via OROMUCOSAL

## 2016-04-17 MED ORDER — ONDANSETRON HCL 4 MG/2ML IJ SOLN: 4.0000 mg | Freq: Four times a day (QID) | INTRAMUSCULAR | Status: DC | PRN

## 2016-04-17 MED ORDER — MIRABEGRON ER 25 MG PO TB24
25.0000 mg | ORAL_TABLET | Freq: Every day | ORAL | Status: DC
  Administered 2016-04-18: 25 mg via ORAL
  Filled 2016-04-17: qty 1

## 2016-04-17 MED ORDER — PIPERACILLIN-TAZOBACTAM 3.375 G IVPB
3.3750 g | Freq: Once | INTRAVENOUS | Status: DC
  Administered 2016-04-17: 3.375 g via INTRAVENOUS
  Filled 2016-04-17: qty 50

## 2016-04-17 MED ORDER — POLYETHYLENE GLYCOL 3350 17 G PO PACK
17.0000 g | PACK | Freq: Every day | ORAL | Status: DC
  Administered 2016-04-18: 17 g via ORAL
  Filled 2016-04-17: qty 1

## 2016-04-17 MED ORDER — SENNA 8.6 MG PO TABS: 1.0000 | ORAL_TABLET | Freq: Every day | ORAL | Status: DC | PRN

## 2016-04-17 MED ORDER — CYANOCOBALAMIN 500 MCG PO TABS
500.0000 ug | ORAL_TABLET | Freq: Every day | ORAL | Status: DC
  Administered 2016-04-18 – 2016-04-19 (×2): 500 ug via ORAL
  Filled 2016-04-17 (×2): qty 1

## 2016-04-17 MED ORDER — VITAMIN D3 25 MCG (1000 UNIT) PO TABS
2000.0000 [IU] | ORAL_TABLET | Freq: Every evening | ORAL | Status: DC
  Administered 2016-04-18: 2000 [IU] via ORAL
  Filled 2016-04-17: qty 2

## 2016-04-17 MED ORDER — IMIPENEM-CILASTATIN 500 MG IV SOLR
500.0000 mg | Freq: Three times a day (TID) | INTRAVENOUS | Status: DC
  Administered 2016-04-17 – 2016-04-19 (×6): 500 mg via INTRAVENOUS
  Filled 2016-04-17 (×6): qty 500

## 2016-04-17 MED ORDER — NALOXONE HCL 0.4 MG/ML IJ SOLN
0.4000 mg | Freq: Once | INTRAMUSCULAR | Status: AC
  Administered 2016-04-17: 0.4 mg via INTRAVENOUS
  Filled 2016-04-17: qty 1

## 2016-04-17 MED ORDER — FLUTICASONE PROPIONATE 50 MCG/ACT NA SUSP
2.0000 | Freq: Every day | NASAL | Status: DC
  Administered 2016-04-18 – 2016-04-19 (×2): 2 via NASAL
  Filled 2016-04-17: qty 16

## 2016-04-17 MED ORDER — ACETAMINOPHEN 325 MG PO TABS: 650.0000 mg | ORAL_TABLET | Freq: Four times a day (QID) | ORAL | Status: DC | PRN

## 2016-04-17 MED ORDER — ALBUTEROL SULFATE (2.5 MG/3ML) 0.083% IN NEBU
2.5000 mg | INHALATION_SOLUTION | Freq: Three times a day (TID) | RESPIRATORY_TRACT | Status: DC
  Administered 2016-04-17 – 2016-04-19 (×5): 2.5 mg via RESPIRATORY_TRACT
  Filled 2016-04-17 (×5): qty 3

## 2016-04-17 MED ORDER — ACETAMINOPHEN 650 MG RE SUPP: 650.0000 mg | Freq: Four times a day (QID) | RECTAL | Status: DC | PRN

## 2016-04-17 MED ORDER — ENOXAPARIN SODIUM 40 MG/0.4ML ~~LOC~~ SOLN: 40.0000 mg | SUBCUTANEOUS | Status: DC

## 2016-04-17 MED ORDER — ASPIRIN 81 MG PO CHEW
81.0000 mg | CHEWABLE_TABLET | Freq: Every day | ORAL | Status: DC
  Administered 2016-04-18 – 2016-04-19 (×2): 81 mg via ORAL
  Filled 2016-04-17 (×2): qty 1

## 2016-04-17 MED ORDER — ACETAMINOPHEN 500 MG PO TABS: 500.0000 mg | ORAL_TABLET | Freq: Three times a day (TID) | ORAL | Status: DC | PRN

## 2016-04-17 MED ORDER — OLOPATADINE HCL 0.1 % OP SOLN
1.0000 [drp] | Freq: Two times a day (BID) | OPHTHALMIC | Status: DC
  Administered 2016-04-17 – 2016-04-19 (×4): 1 [drp] via OPHTHALMIC
  Filled 2016-04-17: qty 5

## 2016-04-17 MED ORDER — HYDRALAZINE HCL 25 MG PO TABS
25.0000 mg | ORAL_TABLET | Freq: Two times a day (BID) | ORAL | Status: DC
  Administered 2016-04-18 – 2016-04-19 (×3): 25 mg via ORAL
  Filled 2016-04-17 (×3): qty 1

## 2016-04-17 MED ORDER — CHLORHEXIDINE GLUCONATE 0.12 % MT SOLN
15.0000 mL | Freq: Two times a day (BID) | OROMUCOSAL | Status: DC
  Administered 2016-04-17 – 2016-04-19 (×4): 15 mL via OROMUCOSAL
  Filled 2016-04-17 (×4): qty 15

## 2016-04-17 NOTE — ED Notes (Signed)
Report to Nira Conn, RN on 3W

## 2016-04-17 NOTE — ED Triage Notes (Signed)
Pt BIB EMS. Pt was at [facility] getting her suprapubic catheter flushed. EMS states they were going to take her back to [facility], but pt had decreased mental status and pinpoint pupils. Staff at [facility] denied giving pt any sedating medication. Pt has no acute distress, responds to verbal and physical and light touch stimulation. Skin warm, dry.

## 2016-04-17 NOTE — ED Notes (Signed)
RT at bedside for art blood gas.

## 2016-04-17 NOTE — ED Provider Notes (Signed)
[facility] DEPT Provider Note   CSN: [id number] Arrival date & time: 04/17/16  1110  First Provider Contact:  First MD Initiated Contact with Patient 04/17/16 1113        History   Chief Complaint Chief Complaint  Patient presents with  . Altered Mental Status    HPI [name] is a 82 y.o. female.  HPI 82 year old female with past medical history of chronic schizophrenia, dementia, hypertension, chronic kidney disease, CHF, and CVAs with indwelling suprapubic catheter who presents with generalized weakness. According to her daughter, the patient was last seen normal last night. She is normally awake and interactive and was her usual self. However, upon awakening today, she was acutely drowsy. She was taken to her urologist, at which time her suprapubic catheter was replaced. He was then concerned about her drowsiness and sent her here for evaluation. Currently, the patient is drowsy and unable to provide further history.   Remainder of history, ROS, and physical exam limited due to patient's dementia, AMS.   Past Medical History:  Diagnosis Date  . Acute sinusitis   . Anemia   . Arthritis   . Breast cancer ([place])   . Cervical spondylosis   . CHF (congestive heart failure) (HCC)    chronic and stable per MD note  . Chronic kidney disease    chronic kidney disease per MD notes  . Chronic schizophrenia ([place])   . COPD (chronic obstructive pulmonary disease) ([place])   . Dementia   . Depressive disorder   . DM (diabetes mellitus) ([place])    DIET CONTROLLED  . Foley catheter in place   . GERD (gastroesophageal reflux disease)   . Glaucoma   . Hyperlipidemia   . Hypertension   . On home oxygen therapy    2 L PER NASAL CANNULA  . Osteoporosis   . Schizophrenia ([place])   . Stenosis of lumbosacral spine   . Stroke (HCC)    L hemiparesis  . Wheelchair dependent     Patient Active Problem List   Diagnosis Date Noted  . Complicated UTI (urinary tract infection) 04/17/2016    . Acute encephalopathy 04/17/2016  . Diabetes mellitus type 2 in obese ([place]) 03/27/2016  . History of CVA with residual deficit 03/01/2016  . Chronic constipation 03/01/2016  . Chronic major depressive disorder ([place]) 03/01/2016  . Anemia, chronic disease 03/01/2016  . ESBL (extended spectrum beta-lactamase) producing bacteria infection 02/29/2016  . Altered mental status 02/25/2016  . Encephalopathy acute 02/25/2016  . Urinary tract infection 02/25/2016  . Type 2 diabetes mellitus with diabetic neuropathy, without long-term current use of insulin ([place]) 01/10/2016  . Allergic conjunctivitis and rhinitis 12/07/2015  . Essential hypertension, benign 11/10/2015  . Constipation, chronic 11/10/2015  . Hyperlipidemia 04/15/2015  . B12 deficiency 04/15/2015  . Depression due to dementia 03/02/2015  . Neurogenic dysfunction of the urinary bladder 01/09/2015  . Major depressive disorder, recurrent, in remission ([place]) 01/09/2015  . Pain, neuropathic 01/09/2015  . Hypertensive heart and renal disease with congestive heart failure ([place]) 01/09/2015  . Chronic systolic heart failure ([place]) 01/09/2015  . Type 2 diabetes mellitus with polyneuropathy ([place]) 01/09/2015  . Chronic respiratory failure with hypoxia ([place]) 10/14/2014  . CKD stage 2 due to type 2 diabetes mellitus ([place]) 10/14/2014  . Neuropathic pain 09/15/2014  . Senile osteoporosis 09/15/2014  . Primary generalized (osteo)arthritis 09/15/2014  . Neurogenic bladder 06/13/2014  . Hemiplegia of nondominant side following CVA (cerebrovascular accident) ([place]) 06/13/2014  . Chronic respiratory failure,  unspecified whether with hypoxia or hypercapnia ([place]) 06/13/2014  . Osteoporosis, unspecified 06/13/2014  . Anemia of other chronic disease 06/13/2014  . Vascular dementia 04/14/2014  . Schizophrenia, chronic condition ([place]) 04/14/2014  . Benign hypertensive heart and kidney disease with heart failure and with chronic kidney disease stage I  through stage IV, or unspecified(404.11) 04/14/2014  . Type II or unspecified type diabetes mellitus with neurological manifestations, not stated as uncontrolled 04/14/2014  . Urinary retention with incomplete bladder emptying 04/12/2014  . Unspecified vitamin D deficiency 02/28/2014  . Hemiplegia affecting nondominant side, late effect of cerebrovascular disease 02/28/2014  . Polyneuropathy in diabetes(357.2) 02/28/2014  . Esophageal reflux 02/28/2014  . Peripheral angiopathy in diseases classified elsewhere ([place]) 02/28/2014  . Anemia   . Chronic schizophrenia ([place])   . Dementia   . Depressive disorder   . Stroke ([place])   . COPD (chronic obstructive pulmonary disease) ([place]) 05/13/2012    Past Surgical History:  Procedure Laterality Date  . APPENDECTOMY    . BREAST SURGERY     breast cancer  . CATARACT EXTRACTION, BILATERAL    . CHOLECYSTECTOMY    . INSERTION OF SUPRAPUBIC CATHETER N/A 04/29/2014   Procedure: INSERTION OF SUPRAPUBIC CATHETER;  Surgeon: Arvil Persons, MD;  Location: [facility] ORS;  Service: Urology;  Laterality: N/A;  . JOINT REPLACEMENT     L TOTAL KNEE  . UTERINE SUSPENSION      OB History    No data available       Home Medications    Prior to Admission medications   Medication Sig Start Date End Date Taking? Authorizing Provider  acetaminophen (TYLENOL) 500 MG tablet Take 500 mg by mouth every 8 (eight) hours as needed for mild pain or fever.    Yes Historical Provider, MD  albuterol (PROVENTIL) (2.5 MG/3ML) 0.083% nebulizer solution Take 2.5 mg by nebulization 3 (three) times daily.   Yes Historical Provider, MD  amLODipine (NORVASC) 5 MG tablet Take 5 mg by mouth daily with breakfast.    Yes Historical Provider, MD  aspirin 81 MG chewable tablet Chew 1 tablet (81 mg total) by mouth daily. Patient taking differently: Chew 81 mg by mouth daily with breakfast.  02/29/16  Yes Nishant Dhungel, MD  budesonide (PULMICORT) 0.5 MG/2ML nebulizer solution Take 0.5 mg by  nebulization 2 (two) times daily.   Yes Historical Provider, MD  Cholecalciferol (VITAMIN D3) 2000 UNITS TABS Take 2,000 Units by mouth every evening. For vitamin D deficiency    Yes Historical Provider, MD  Cyanocobalamin (VITAMIN B-12) 1000 MCG SUBL Place 0.5 tablets under the tongue daily with breakfast.   Yes Historical Provider, MD  cycloSPORINE (RESTASIS) 0.05 % ophthalmic emulsion Place 1 drop into both eyes 2 (two) times daily.    Yes Historical Provider, MD  donepezil (ARICEPT) 10 MG tablet Take 10 mg by mouth at bedtime.    Yes Historical Provider, MD  fluPHENAZine decanoate (PROLIXIN) 25 MG/ML injection Inject 25 mg into the muscle every 30 (thirty) days.   Yes Historical Provider, MD  fluticasone (FLONASE) 50 MCG/ACT nasal spray Place 2 sprays into the nose every evening. Reported on 11/10/2015   Yes Historical Provider, MD  hydrALAZINE (APRESOLINE) 25 MG tablet Take 25 mg by mouth 2 (two) times daily.   Yes Historical Provider, MD  mirabegron ER (MYRBETRIQ) 25 MG TB24 tablet Take 25 mg by mouth at bedtime.    Yes Historical Provider, MD  olopatadine (PATANOL) 0.1 % ophthalmic solution Place 1 drop into  both eyes 2 (two) times daily.    Yes Historical Provider, MD  OXYGEN Inhale 2 L into the lungs continuous.    Yes Historical Provider, MD  polyethylene glycol (MIRALAX / GLYCOLAX) packet Take 17 g by mouth daily. Patient taking differently: Take 17 g by mouth at bedtime.  02/29/16  Yes Nishant Dhungel, MD  Propylene Glycol (SYSTANE BALANCE OP) Place 1 drop into both eyes 2 (two) times daily.    Yes Historical Provider, MD  senna (SENOKOT) 8.6 MG TABS tablet Take 1 tablet (8.6 mg total) by mouth daily as needed for mild constipation. 02/29/16  Yes Nishant Dhungel, MD  sertraline (ZOLOFT) 50 MG tablet Take 50 mg by mouth daily with breakfast.    Yes Historical Provider, MD    Family History Family History  Problem Relation Age of Onset  . Asthma Daughter   . Heart disease Sister     3  sisters  . Heart disease Brother     x 2  . Cancer Brother     multiple myloma    Social History Social History  Substance Use Topics  . Smoking status: Former Smoker    Packs/day: 1.00    Years: 15.00    Types: Cigarettes    Quit date: 09/16/1997  . Smokeless tobacco: Never Used  . Alcohol use No     Allergies   Codeine; Lisinopril; and Other   Review of Systems Review of Systems  Unable to perform ROS: Dementia     Physical Exam Updated Vital Signs BP 119/76 (BP Location: Left Arm)   Pulse 64   Temp 99 F (37.2 C) (Axillary)   Resp 15   SpO2 100%   Physical Exam  Constitutional: She appears well-nourished. No distress.  HENT:  Head: Normocephalic.  Mouth/Throat: Oropharynx is clear and moist. No oropharyngeal exudate.  Eyes: Conjunctivae are normal. Pupils are equal, round, and reactive to light.  Neck: Neck supple.  Cardiovascular: Normal rate, regular rhythm, normal heart sounds and intact distal pulses.  Exam reveals no friction rub.   No murmur heard. Pulmonary/Chest: Effort normal. No respiratory distress. She has wheezes. She has no rales.  Abdominal: Soft. Bowel sounds are normal. There is tenderness (mild, suprapubic).  Musculoskeletal: She exhibits no edema.  Neurological: She exhibits normal muscle tone.  Drowsy but arousable. Face is symmetric. Moves b/l UE with at least antigravity strength. Normal tongue protrusion. Minimal movement of LE though withdrawal to noxious stimuli distally b/l.  Nursing note and vitals reviewed.    ED Treatments / Results  Labs (all labs ordered are listed, but only abnormal results are displayed) Labs Reviewed  COMPREHENSIVE METABOLIC PANEL - Abnormal; Notable for the following:       Result Value   ALT 10 (*)    Anion gap 4 (*)    All other components within normal limits  CBC WITH DIFFERENTIAL/PLATELET - Abnormal; Notable for the following:    Hemoglobin 11.1 (*)    RDW 16.8 (*)    All other components  within normal limits  URINALYSIS, ROUTINE W REFLEX MICROSCOPIC (NOT AT [facility]) - Abnormal; Notable for the following:    APPearance CLOUDY (*)    Hgb urine dipstick SMALL (*)    Leukocytes, UA LARGE (*)    All other components within normal limits  URINE MICROSCOPIC-ADD ON - Abnormal; Notable for the following:    Squamous Epithelial / LPF 0-5 (*)    Bacteria, UA FEW (*)    All other components  within normal limits  BLOOD GAS, ARTERIAL - Abnormal; Notable for the following:    pCO2 arterial 46.1 (*)    pO2, Arterial 120 (*)    Bicarbonate 27.5 (*)    Acid-Base Excess 2.6 (*)    All other components within normal limits  GRAM STAIN  MRSA PCR SCREENING  URINE CULTURE  BASIC METABOLIC PANEL  CBC  I-STAT CG4 LACTIC ACID, ED  Randolm Idol, ED    EKG  EKG Interpretation  Date/Time:  Wednesday April 17 2016 11:32:38 EDT Ventricular Rate:  66 PR Interval:    QRS Duration: 92 QT Interval:  392 QTC Calculation: 411 R Axis:   55 Text Interpretation:  Sinus rhythm Prolonged PR interval Abnormal R-wave progression, early transition No significant change since last tracing Confirmed by Piya Mesch MD, Lysbeth Galas [phone]) on 04/17/2016 9:17:35 PM       Radiology Ct Head Wo Contrast  Result Date: 04/17/2016 CLINICAL DATA:  Altered mental status and unresponsive pupils EXAM: CT HEAD WITHOUT CONTRAST TECHNIQUE: Contiguous axial images were obtained from the base of the skull through the vertex without intravenous contrast. COMPARISON:  February 24, 2016 FINDINGS: Brain: Moderate diffuse atrophy is stable. There is no intracranial mass, hemorrhage, extra-axial fluid collection, or midline shift. There is extensive small vessel disease throughout the centra semiovale bilaterally. There are prior infarcts in the right internal capsule at multiple sites with extension of infarct into the inferior posterior right basal ganglia. No new gray-white compartment lesions are identified. No acute infarct evident.  Vascular: No hyperdense vessels. There is calcification in each carotid siphon as well as in each proximal middle cerebral artery. Skull: Bony calvarium appears intact. Sinuses/Orbits: Visualized paranasal sinuses are clear. Orbits appear symmetric bilaterally. Other: Mastoid air cells are clear. IMPRESSION: Atrophy with extensive supratentorial small vessel disease. Prior right basal ganglia region infarcts. No acute infarct evident. No hemorrhage or mass effect. Areas of vascular calcification. Electronically Signed   By: Lowella Grip III M.D.   On: 04/17/2016 13:19   Dg Chest Portable 1 View  Result Date: 04/17/2016 CLINICAL DATA:  Altered mental status, wheezing. Patient unresponsive. EXAM: PORTABLE CHEST 1 VIEW COMPARISON:  02/24/2016 FINDINGS: Cardiomegaly with vascular congestion. Probable aneurysmal dilatation of the aortic arch with scattered calcifications. No confluent airspace opacities or effusions. No overt edema. No acute bony abnormality. IMPRESSION: Cardiomegaly with vascular congestion. Aneurysmal dilatation of the aortic arch. Electronically Signed   By: Rolm Baptise M.D.   On: 04/17/2016 12:19    Procedures Procedures (including critical care time)  Medications Ordered in ED Medications  cyanocobalamin tablet 500 mcg (not administered)  aspirin chewable tablet 81 mg (not administered)  budesonide (PULMICORT) nebulizer solution 0.5 mg (0.5 mg Nebulization Given 04/17/16 1936)  mirabegron ER (MYRBETRIQ) tablet 25 mg (not administered)  senna (SENOKOT) tablet 8.6 mg (not administered)  amLODipine (NORVASC) tablet 5 mg (not administered)  hydrALAZINE (APRESOLINE) tablet 25 mg (not administered)  polyvinyl alcohol (LIQUIFILM TEARS) 1.4 % ophthalmic solution (not administered)  cholecalciferol (VITAMIN D) tablet 2,000 Units (not administered)  olopatadine (PATANOL) 0.1 % ophthalmic solution 1 drop (not administered)  donepezil (ARICEPT) tablet 10 mg (not administered)    cycloSPORINE (RESTASIS) 0.05 % ophthalmic emulsion 1 drop (not administered)  sertraline (ZOLOFT) tablet 50 mg (not administered)  fluticasone (FLONASE) 50 MCG/ACT nasal spray 2 spray (not administered)  albuterol (PROVENTIL) (2.5 MG/3ML) 0.083% nebulizer solution 2.5 mg (2.5 mg Nebulization Given 04/17/16 1936)  polyethylene glycol (MIRALAX / GLYCOLAX) packet 17 g (not administered)  ondansetron (ZOFRAN) tablet 4 mg (not administered)    Or  ondansetron (ZOFRAN) injection 4 mg (not administered)  albuterol (PROVENTIL) (2.5 MG/3ML) 0.083% nebulizer solution 2.5 mg (not administered)  0.9 %  sodium chloride infusion ( Intravenous New Bag/Given 04/17/16 1702)  acetaminophen (TYLENOL) tablet 650 mg (not administered)    Or  acetaminophen (TYLENOL) suppository 650 mg (not administered)  imipenem-cilastatin (PRIMAXIN) 500 mg in sodium chloride 0.9 % 100 mL IVPB (500 mg Intravenous Given 04/17/16 1758)  chlorhexidine (PERIDEX) 0.12 % solution 15 mL (not administered)  antiseptic oral rinse (CPC / CETYLPYRIDINIUM CHLORIDE 0.05%) solution 7 mL (not administered)  naloxone (NARCAN) injection 0.4 mg (0.4 mg Intravenous Given 04/17/16 1214)     Initial Impression / Assessment and Plan / ED Course  I have reviewed the triage vital signs and the nursing notes.  Pertinent labs & imaging results that were available during my care of the patient were reviewed by me and considered in my medical decision making (see chart for details).  Clinical Course   82 year old female with complicated past medical history who presents with generalized confusion. Vital signs are stable. On arrival, patient is afebrile. She is notably drowsy but protecting her airway. No apparent cranial nerve deficits and she moves all extremities with antigravity strength. She is arousable, but quickly falls back to sleep. Primary concern is likely underlying infectious versus metabolic abnormality. The patient has a history of UTIs and I'm  suspicious of this as the patient's daughter reports that her urine has been cloudy pior to changing the catheter. Otherwise, will check for occult pneumonia, metabolic abnormality, as well as CT head given her acute change. She is outside of the window of any TPA or intervention. I see no new focal neurological deficits on exam.  Labwork reviewed as above. Blood gas is reassuring. Lactic acid is normal and CBC, CMP are at baseline. Urinalysis does show pyuria as well as positive Gram stain. This, along with reported confusion in the setting of previous UTIs, raises concern for complicated UTI. I reviewed the patient's previous cultures. She has grown Escherichia coli, pseudomonas, and multiple other drug-resistant organisms. Based on this review, will start with empiric Zosyn, as this does seem to cover MDRs based on previous cultures. Will admit to medicine for further management  Final Clinical Impressions(s) / ED Diagnoses   Final diagnoses:  Complicated UTI (urinary tract infection)  Delirium    New Prescriptions Current Discharge Medication List       Duffy Bruce, MD 04/17/16 2120

## 2016-04-17 NOTE — ED Notes (Signed)
Dr. Myrene Buddy aware of patient response after receiving Narcan.

## 2016-04-17 NOTE — H&P (Signed)
HISTORY AND PHYSICAL       PATIENT DETAILS Name: [name] Age: 82 y.o. Sex: female Date of Birth: [birth year] Admit Date: 04/17/2016 [id number], Karalee Height, MD  CHIEF COMPLAINT:  Confusion/lethargy since this morning  HPI: [name] is a 82 y.o. female with medical history significant of ESBL Escherichia coli UTI in June 2017, dementia, COPD on home oxygen, schizophrenia, hypertension, diet-controlled diabetes who was brought to [facility] emergency room for evaluation of confusion and lethargy. Please note most of this history is obtained from the patient's daughter at bedside, she has dementia, is somewhat encephalopathic and history from the patient is somewhat limited. Apparently patient was in her usual state of health yesterday, patient's daughter was with the patient most of the day. At baseline she is able to converse, but is bed to wheelchair bound. She does need some assistance with eating and is currently on a dysphagia 3 diet. Patient was brought to [facility] earlier this morning for a suprapubic Foley catheter change, while at [facility] she was noted to be confused and altered. After having a catheter changed, she was referred to the emergency room, where it was determined she could have a UTI and I was asked to admit this patient for further evaluation and treatment. Patient does acknowledge intermittent dysuria for the past few days, not sure if there is history of fever.  From what I can gather, there is no history of nausea, vomiting, diarrhea or abdominal pain.  ED Course:  Patient was given IV fluids, 1 dose of intravenous Zosyn and I was asked to admit this patient for further evaluation and treatment  Lives at:SNF Mobility:Bedbound Chronic Indwelling Foley:yes-has chronic suprapubic catheter   REVIEW OF SYSTEMS: obtained from daughter Constitutional:   No  weight loss, night sweats,  Fevers, chills  HEENT:    No headaches, Sore throat,    No sneezing, itching, ear ache, nasal congestion, post nasal drip  Cardio-vascular: No chest pain,Orthopnea, PND,lower extremity edema, anasarca, palpitations  GI:  No heartburn, indigestion, abdominal pain, nausea, vomiting, diarrhea, melena or hematochezia  Resp: No shortness of breath, cough, hemoptysis,plueritic chest pain.   Skin:  No rash or lesions.  GU:  No change in color of urine,  No flank pain.  Musculoskeletal: No joint pain or swelling.    Endocrine: No heat intolerance, no cold intolerance, no polyuria, no polydipsia   ALLERGIES:   Allergies  Allergen Reactions  . Codeine     Documented on MAR  . Lisinopril     Per MAR leg weakness    PAST MEDICAL HISTORY: Past Medical History:  Diagnosis Date  . Acute sinusitis   . Anemia   . Arthritis   . Breast cancer ([place])   . Cervical spondylosis   . CHF (congestive heart failure) (HCC)    chronic and stable per MD note  . Chronic kidney disease    chronic kidney disease per MD notes  . Chronic schizophrenia ([place])   . COPD (chronic obstructive pulmonary disease) ([place])   . Dementia   . Depressive disorder   . DM (diabetes mellitus) ([place])    DIET CONTROLLED  . Foley catheter in place   . GERD (gastroesophageal reflux disease)   . Glaucoma   . Hyperlipidemia   . Hypertension   . On home oxygen therapy    2 L PER NASAL CANNULA  . Osteoporosis   . Schizophrenia ([place])   . Stenosis of  lumbosacral spine   . Stroke (HCC)    L hemiparesis  . Wheelchair dependent     PAST SURGICAL HISTORY: Past Surgical History:  Procedure Laterality Date  . APPENDECTOMY    . BREAST SURGERY     breast cancer  . CATARACT EXTRACTION, BILATERAL    . CHOLECYSTECTOMY    . INSERTION OF SUPRAPUBIC CATHETER N/A 04/29/2014   Procedure: INSERTION OF SUPRAPUBIC CATHETER;  Surgeon: Arvil Persons, MD;  Location: [facility]S;  Service: Urology;  Laterality: N/A;  . JOINT REPLACEMENT     L TOTAL KNEE  . UTERINE SUSPENSION       MEDICATIONS AT HOME: Prior to Admission medications   Medication Sig Start Date End Date Taking? Authorizing Provider  acetaminophen (TYLENOL) 500 MG tablet Take 500 mg by mouth every 8 (eight) hours as needed for mild pain or fever.    Yes Historical Provider, MD  albuterol (PROVENTIL) (2.5 MG/3ML) 0.083% nebulizer solution Take 2.5 mg by nebulization 3 (three) times daily.   Yes Historical Provider, MD  amLODipine (NORVASC) 5 MG tablet Take 5 mg by mouth daily with breakfast.    Yes Historical Provider, MD  aspirin 81 MG chewable tablet Chew 1 tablet (81 mg total) by mouth daily. Patient taking differently: Chew 81 mg by mouth daily with breakfast.  02/29/16  Yes Nishant Dhungel, MD  budesonide (PULMICORT) 0.5 MG/2ML nebulizer solution Take 0.5 mg by nebulization 2 (two) times daily.   Yes Historical Provider, MD  Cholecalciferol (VITAMIN D3) 2000 UNITS TABS Take 2,000 Units by mouth every evening. For vitamin D deficiency    Yes Historical Provider, MD  Cyanocobalamin (VITAMIN B-12) 1000 MCG SUBL Place 0.5 tablets under the tongue daily with breakfast.   Yes Historical Provider, MD  cycloSPORINE (RESTASIS) 0.05 % ophthalmic emulsion Place 1 drop into both eyes 2 (two) times daily.    Yes Historical Provider, MD  donepezil (ARICEPT) 10 MG tablet Take 10 mg by mouth at bedtime.    Yes Historical Provider, MD  fluPHENAZine decanoate (PROLIXIN) 25 MG/ML injection Inject 25 mg into the muscle every 30 (thirty) days.   Yes Historical Provider, MD  fluticasone (FLONASE) 50 MCG/ACT nasal spray Place 2 sprays into the nose every evening. Reported on 11/10/2015   Yes Historical Provider, MD  hydrALAZINE (APRESOLINE) 25 MG tablet Take 25 mg by mouth 2 (two) times daily.   Yes Historical Provider, MD  mirabegron ER (MYRBETRIQ) 25 MG TB24 tablet Take 25 mg by mouth at bedtime.    Yes Historical Provider, MD  olopatadine (PATANOL) 0.1 % ophthalmic solution Place 1 drop into both eyes 2 (two) times daily.     Yes Historical Provider, MD  OXYGEN Inhale 2 L into the lungs continuous.    Yes Historical Provider, MD  polyethylene glycol (MIRALAX / GLYCOLAX) packet Take 17 g by mouth daily. Patient taking differently: Take 17 g by mouth at bedtime.  02/29/16  Yes Nishant Dhungel, MD  Propylene Glycol (SYSTANE BALANCE OP) Place 1 drop into both eyes 2 (two) times daily.    Yes Historical Provider, MD  senna (SENOKOT) 8.6 MG TABS tablet Take 1 tablet (8.6 mg total) by mouth daily as needed for mild constipation. 02/29/16  Yes Nishant Dhungel, MD  sertraline (ZOLOFT) 50 MG tablet Take 50 mg by mouth daily with breakfast.    Yes Historical Provider, MD    FAMILY HISTORY: Family History  Problem Relation Age of Onset  . Asthma Daughter   . Heart disease  Sister     3 sisters  . Heart disease Brother     x 2  . Cancer Brother     multiple myloma    SOCIAL HISTORY:  reports that she quit smoking about 18 years ago. Her smoking use included Cigarettes. She has a 15.00 pack-year smoking history. She does not have any smokeless tobacco history on file. She reports that she does not drink alcohol or use drugs.  PHYSICAL EXAM: Blood pressure 129/74, pulse 63, temperature 98 F (36.7 C), temperature source Oral, resp. rate 15, SpO2 100 %.  General appearance :Awake,Somewhat lethargic-answers some questions appropriately but dozes back to sleep. 1 word answers mostly. HEENT: Atraumatic and Normocephalic, pupils equally reactive to light and accomodation Neck: supple, no JVD. No cervical lymphadenopathy.  Chest:Good air entry bilaterally, no added sounds  CVS: S1 S2 regular, 2/6 systolic murmur.   Abdomen: Bowel sounds present, Non tender and not distended with no gaurding, rigidity or rebound. Abdomen is obese Extremities: B/L Lower Ext shows no edema, both legs are warm to touch Neurology:  Non focal-but with generalized weakness Psychiatric: Difficult to assess at this time given mental status Skin:No  Rash Wounds:N/A  LABS ON ADMISSION:  I have personally reviewed following labs and imaging studies  CBC:  Recent Labs Lab 04/17/16 1200  WBC 8.0  NEUTROABS 5.6  HGB 11.1*  HCT 36.0  MCV 86.3  PLT 99991111    Basic Metabolic Panel:  Recent Labs Lab 04/17/16 1200  NA 139  K 4.3  CL 107  CO2 28  GLUCOSE 88  BUN 14  CREATININE 0.70  CALCIUM 9.8    GFR: CrCl cannot be calculated (Unknown ideal weight.).  Liver Function Tests:  Recent Labs Lab 04/17/16 1200  AST 16  ALT 10*  ALKPHOS 84  BILITOT 0.3  PROT 7.1  ALBUMIN 3.6   No results for input(s): LIPASE, AMYLASE in the last 168 hours. No results for input(s): AMMONIA in the last 168 hours.  Coagulation Profile: No results for input(s): INR, PROTIME in the last 168 hours.  Cardiac Enzymes: No results for input(s): CKTOTAL, CKMB, CKMBINDEX, TROPONINI in the last 168 hours.  BNP (last 3 results) No results for input(s): PROBNP in the last 8760 hours.  HbA1C: No results for input(s): HGBA1C in the last 72 hours.  CBG: No results for input(s): GLUCAP in the last 168 hours.  Lipid Profile: No results for input(s): CHOL, HDL, LDLCALC, TRIG, CHOLHDL, LDLDIRECT in the last 72 hours.  Thyroid Function Tests: No results for input(s): TSH, T4TOTAL, FREET4, T3FREE, THYROIDAB in the last 72 hours.  Anemia Panel: No results for input(s): VITAMINB12, FOLATE, FERRITIN, TIBC, IRON, RETICCTPCT in the last 72 hours.  Urine analysis:    Component Value Date/Time   COLORURINE YELLOW 04/17/2016 1123   APPEARANCEUR CLOUDY (A) 04/17/2016 1123   APPEARANCEUR Cloudy 02/08/2014 1200   LABSPEC 1.013 04/17/2016 1123   LABSPEC 1.014 02/08/2014 1200   PHURINE 6.5 04/17/2016 1123   GLUCOSEU NEGATIVE 04/17/2016 1123   GLUCOSEU Negative 02/08/2014 1200   HGBUR SMALL (A) 04/17/2016 1123   BILIRUBINUR NEGATIVE 04/17/2016 1123   BILIRUBINUR Negative 02/08/2014 1200   KETONESUR NEGATIVE 04/17/2016 1123   PROTEINUR NEGATIVE  04/17/2016 1123   UROBILINOGEN 0.2 12/21/2013 1132   NITRITE NEGATIVE 04/17/2016 1123   LEUKOCYTESUR LARGE (A) 04/17/2016 1123   LEUKOCYTESUR 2+ 02/08/2014 1200    Sepsis Labs: Lactic Acid, Venous    Component Value Date/Time   LATICACIDVEN 0.93 04/17/2016 1215  Microbiology: Recent Results (from the past 240 hour(s))  Gram stain     Status: None   Collection Time: 04/17/16 11:23 AM  Result Value Ref Range Status   Specimen Description URINE, SUPRAPUBIC  Final   Special Requests NONE  Final   Gram Stain   Final    CYTOSPIN SMEAR WBC PRESENT,BOTH PMN AND MONONUCLEAR GRAM NEGATIVE RODS    Report Status 04/17/2016 FINAL  Final      RADIOLOGIC STUDIES ON ADMISSION: Ct Head Wo Contrast  Result Date: 04/17/2016 CLINICAL DATA:  Altered mental status and unresponsive pupils EXAM: CT HEAD WITHOUT CONTRAST TECHNIQUE: Contiguous axial images were obtained from the base of the skull through the vertex without intravenous contrast. COMPARISON:  February 24, 2016 FINDINGS: Brain: Moderate diffuse atrophy is stable. There is no intracranial mass, hemorrhage, extra-axial fluid collection, or midline shift. There is extensive small vessel disease throughout the centra semiovale bilaterally. There are prior infarcts in the right internal capsule at multiple sites with extension of infarct into the inferior posterior right basal ganglia. No new gray-white compartment lesions are identified. No acute infarct evident. Vascular: No hyperdense vessels. There is calcification in each carotid siphon as well as in each proximal middle cerebral artery. Skull: Bony calvarium appears intact. Sinuses/Orbits: Visualized paranasal sinuses are clear. Orbits appear symmetric bilaterally. Other: Mastoid air cells are clear. IMPRESSION: Atrophy with extensive supratentorial small vessel disease. Prior right basal ganglia region infarcts. No acute infarct evident. No hemorrhage or mass effect. Areas of vascular  calcification. Electronically Signed   By: Lowella Grip III M.D.   On: 04/17/2016 13:19   Dg Chest Portable 1 View  Result Date: 04/17/2016 CLINICAL DATA:  Altered mental status, wheezing. Patient unresponsive. EXAM: PORTABLE CHEST 1 VIEW COMPARISON:  02/24/2016 FINDINGS: Cardiomegaly with vascular congestion. Probable aneurysmal dilatation of the aortic arch with scattered calcifications. No confluent airspace opacities or effusions. No overt edema. No acute bony abnormality. IMPRESSION: Cardiomegaly with vascular congestion. Aneurysmal dilatation of the aortic arch. Electronically Signed   By: Rolm Baptise M.D.   On: 04/17/2016 12:19    I have personally reviewed images of chest xray   EKG:  Personally reviewed. Normal sinus rhythm  ASSESSMENT AND PLAN: Present on Admission: . Complicated UTI (urinary tract infection): Does acknowledge dysuria, encephalopathic and exam-given prior history of ESBL Escherichia coli UTI in June 2017-empirically start Primaxin and follow cultures.  . Acute encephalopathy: Probably related to above, seems to be improving. CT head negative for acute abnormalities. Follow.  . Vascular dementia: Currently encephalopathic-suspect some moderate amount of dementia at baseline-Resume Aricept and Zoloft-follow.   . Schizophrenia, chronic condition: Appears to be on fluphenazine injections monthly-resume upon discharge. Although lethargic-pleasant and seems to be following some of my commands.   . Neurogenic dysfunction of the urinary bladder-with suprapubic catheter in place: Suprapubic catheter changed by [facility] today.  . Chronic hypoxic respiratory failure: Suspect from underlying COPD, continue home oxygen regimen.  Marland Kitchen COPD (chronic obstructive pulmonary disease): Lungs clear to auscultation-continue as needed bronchodilators.  . B12 deficiency: Continue vitamin B12 supplementation  . History of dysphagia: Has had a prior history of CVA-apparently  is on a dysphagia 3 diet-due to ongoing encephalopathy-will need a speech therapy evaluation.  Marland Kitchen History of prior CVA: Continue aspirin-although encephalopathic-exam appears nonfocal this afternoon.  Further plan will depend as patient's clinical course evolves and further radiologic and laboratory data become available. Patient will be monitored closely.  Above noted plan was discussed with patient/daughter face  to face at bedside, they were in agreement.   CONSULTS: None  DVT Prophylaxis: Prophylactic Lovenox   Code Status: DNR-confirmed with daughter at bedside-however please note she does not want DO NOT RESUSCITATE continued when patient is discharged back to SNF.  Disposition Plan:  Discharge back to SNF possibly in 2-3 days, depending on clinical course  Admission status: Inpatient  to medical floor  Total time spent  55 minutes.Greater than 50% of this time was spent in counseling, explanation of diagnosis, planning of further management, and coordination of care.  [facility] Pager [phone]  If 7PM-7AM, please contact night-coverage [email or web address] Password [email or web address] 04/17/2016, 2:30 PM

## 2016-04-17 NOTE — Clinical Social Work Note (Addendum)
Clinical Social Work Assessment  Patient Details  Name: [name] MRN: [id number] Date of Birth: [birth year]  Date of referral:  04/17/16               Reason for consult:  Other (Comment Required) (Patient is from [facility])                Permission sought to share information with:    Permission granted to share information::     Name::        Agency::     Relationship::     Contact Information:     Housing/Transportation Living arrangements for the past 2 months:  [place] of Information:  Adult Children (Patient's daughter, Naziyah Demello) Patient Interpreter Needed:  None Criminal Activity/Legal Involvement Pertinent to Current Situation/Hospitalization:  No - Comment as needed Significant Relationships:  Adult Children Lives with:  Facility Resident ([place]) Do you feel safe going back to the place where you live?    Need for family participation in patient care:  Yes (Comment)  Care giving concerns: None at this time per patient's daughter, Emmaree Dampier    Social Worker assessment / plan: CSW spoke with patient's daughter, Shireen Quan [place] and obtained information, as patient was asleep during the assessment. Patient's daughter stated patient had a doctor's appointment to see her Urologist, Dr. Pilar Jarvis to change her catheter. She stated the doctor noticed patient having pinpoint pupils during her visit and stated for patient to be brought to the ED. She stated the EMT could not get patient to respond, as she was unresponsive. She noted patient has been a resident at [facility] for three years. She stated patient receives assistance with ADL's, as she is total care and her food has to be set up for access.  No questions noted for CSW at this time.  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) PT Recommendations:  Not assessed at this  time Information / Referral to community resources:   (None given at this time)  Patient/Family's Response to care: Unknown at this time  Patient/Family's Understanding of and Emotional Response to Diagnosis, Current Treatment, and Prognosis: Unknown at this time  Emotional Assessment Appearance:  Appears stated age Attitude/Demeanor/Rapport:  Unable to Assess Affect (typically observed):  Unable to Assess Orientation:   (Patient was asleep during the assessment) Alcohol / Substance use:  Not Applicable Psych involvement (Current and /or in the community):  No (Comment)  Discharge Needs  Concerns to be addressed:  Other (Comment Required (Unknown) Readmission within the last 30 days:    Current discharge risk:  Other (Unknown) Barriers to Discharge:  Other (Unknown)   Guadelupe Sabin, LCSW 04/17/2016, 4:34 PM

## 2016-04-17 NOTE — ED Notes (Signed)
MD at bedside. Dr. Myrene Buddy at bedside.

## 2016-04-17 NOTE — Progress Notes (Signed)
Pharmacy Antibiotic Note  [name] is a 82 y.o. female with hx ESBL UTI who was at [facility] earlier today (04/17/16) for suprapubic catheter changed and subsequently transported to the ED after she became lethargic and had pinpoint pupils.To start primaxin for suspected UTI.  Plan: - primaxin 500 mg IV q8h - f/u cultures ________________________  Temp (24hrs), Avg:98 F (36.7 C), Min:98 F (36.7 C), Max:98 F (36.7 C)   Recent Labs Lab 04/17/16 1200 04/17/16 1215  WBC 8.0  --   CREATININE 0.70  --   LATICACIDVEN  --  0.93    CrCl cannot be calculated (Unknown ideal weight.).    Allergies  Allergen Reactions  . Codeine     Documented on MAR  . Lisinopril     Per MAR leg weakness  . Other     PAIN MEDICATIONS "KNOCK PT OUT" FOR SEVERAL DAYS.  PT ACCEPTS NO BLOOD PRODUCTS.     Antimicrobials this admission: 8/3 primaxin>>  Microbiology results: 8/2 ucx: 8/2 urine gram stain:GNR final  Thank you for allowing pharmacy to be a part of this patient's care.  Lynelle Doctor 04/17/2016 4:12 PM

## 2016-04-17 NOTE — ED Notes (Signed)
Bed: [id number] Expected date:  Expected time:  Means of arrival:  Comments: EMS-lethargy

## 2016-04-18 DIAGNOSIS — L89322 Pressure ulcer of left buttock, stage 2: Secondary | ICD-10-CM | POA: Insufficient documentation

## 2016-04-18 DIAGNOSIS — N319 Neuromuscular dysfunction of bladder, unspecified: Secondary | ICD-10-CM

## 2016-04-18 LAB — CBC
HCT: 35.5 % — ABNORMAL LOW (ref 36.0–46.0)
HEMOGLOBIN: 10.9 g/dL — AB (ref 12.0–15.0)
MCH: 26.5 pg (ref 26.0–34.0)
MCHC: 30.7 g/dL (ref 30.0–36.0)
MCV: 86.4 fL (ref 78.0–100.0)
Platelets: 190 10*3/uL (ref 150–400)
RBC: 4.11 MIL/uL (ref 3.87–5.11)
RDW: 16.6 % — ABNORMAL HIGH (ref 11.5–15.5)
WBC: 7.2 10*3/uL (ref 4.0–10.5)

## 2016-04-18 LAB — BASIC METABOLIC PANEL
ANION GAP: 6 (ref 5–15)
BUN: 10 mg/dL (ref 6–20)
CALCIUM: 9.2 mg/dL (ref 8.9–10.3)
CHLORIDE: 108 mmol/L (ref 101–111)
CO2: 24 mmol/L (ref 22–32)
Creatinine, Ser: 0.59 mg/dL (ref 0.44–1.00)
GFR calc non Af Amer: >60 mL/min (ref >60)
Glucose, Bld: 99 mg/dL (ref 65–99)
POTASSIUM: 3.9 mmol/L (ref 3.5–5.1)
Sodium: 138 mmol/L (ref 135–145)

## 2016-04-18 MED ORDER — ENOXAPARIN SODIUM 40 MG/0.4ML ~~LOC~~ SOLN
40.0000 mg | SUBCUTANEOUS | Status: DC
  Administered 2016-04-18 – 2016-04-19 (×2): 40 mg via SUBCUTANEOUS
  Filled 2016-04-18 (×2): qty 0.4

## 2016-04-18 NOTE — Progress Notes (Signed)
PROGRESS NOTE        PATIENT DETAILS Name: [name] Age: 82 y.o. Sex: female Date of Birth: 1926/07/29 Admit Date: 04/17/2016 Admitting Physician Evalee Mutton Kristeen Mans, MD [id number], [facility], MD  Brief Narrative: Patient is a 82 y.o. female with past medical history of  ESBL Escherichia coli UTI in June 2017, dementia, COPD on home oxygen, schizophrenia, hypertension, diet-controlled diabetes who was admitted for acute encephalopathy secondary to a complicated UTI. Much improved following initiation of IV antibiotics, awaiting final urine culture results.  Subjective: Much more awake and alert this morning. Conversant. No family at bedside.  Assessment/Plan: Complicated UTI (urinary tract infection):  Did have dysuria, was encephalopathic on admission-significantly improved after initiation of IV antibiotics. Continue Primaxin, follow cultures. She is clinically improved with resolution of encephalopathy, afebrile and without any leukocytosis.   Acute metabolic encephalopathy:  resolved, likely due to above. CT head negative for acute abnormalities.   Hypertension: Controlled, continue amlodipine and hydralazine  Vascular dementia:  encephalopathy has resolved-does have moderate amount of dementia at baseline- continue Aricept and Zoloft  Schizophrenia, chronic condition: Appears to be on fluphenazine injections monthly-resume upon discharge.   Neurogenic dysfunction of the urinary bladder-with suprapubic catheter in place: Suprapubic catheter changed by [facility] on 8/2-draining well. Continue to follow with [facility] as previously scheduled  Chronic hypoxic respiratory failure: Suspect from underlying COPD, continue home oxygen regimen.  COPD (chronic obstructive pulmonary disease): Lungs clear to auscultation-continue as  bronchodilators.   B12 deficiency: Continue vitamin B12 supplementation  History of dysphagia: Has had a prior history of  CVA-apparently is on a dysphagia 3 diet-due to ongoing encephalopathy-will need a speech therapy evaluation.  History of prior CVA: Continue aspirin-although encephalopathic-exam appears nonfocal this afternoon.  Left upper extremity contracture: Per daughter this occurred after a fall and a rotator cuff tear. Continue supportive care. Defer to outpatient M.D. for further evaluation if indicated.  History of spinal stenosis: Supportive care-chronic issue-limiting patient's ambulation.  DVT Prophylaxis: Prophylactic Lovenox   Code Status:  DNR  Family Communication: Attempted to call patient's daughter Ivin Booty Beaumont-[phone]-unable to reach her or leave her a message  Disposition Plan: Remain inpatient-SNF on discharge-hopefully on 8/4 if urine cultures are back  Antimicrobial agents: IV Primaxin 8/2>>  Procedures: None  CONSULTS:  None  Time spent: 25 minutes-Greater than 50% of this time was spent in counseling, explanation of diagnosis, planning of further management, and coordination of care.  MEDICATIONS: Anti-infectives    Start     Dose/Rate Route Frequency Ordered Stop   04/17/16 1700  imipenem-cilastatin (PRIMAXIN) 500 mg in sodium chloride 0.9 % 100 mL IVPB     500 mg 200 mL/hr over 30 Minutes Intravenous Every 8 hours 04/17/16 1624     04/17/16 1345  piperacillin-tazobactam (ZOSYN) IVPB 3.375 g  Status:  Discontinued     3.375 g 12.5 mL/hr over 240 Minutes Intravenous  Once 04/17/16 1344 04/17/16 1624      Scheduled Meds: . albuterol  2.5 mg Nebulization TID  . amLODipine  5 mg Oral Q breakfast  . antiseptic oral rinse  7 mL Mouth Rinse q12n4p  . aspirin  81 mg Oral Q breakfast  . budesonide  0.5 mg Nebulization BID  . chlorhexidine  15 mL Mouth Rinse BID  . cholecalciferol  2,000 Units Oral QPM  . cyanocobalamin  500 mcg  Oral Q breakfast  . cycloSPORINE  1 drop Both Eyes BID  . donepezil  10 mg Oral QHS  . fluticasone  2 spray Each Nare Daily  .  hydrALAZINE  25 mg Oral BID  . imipenem-cilastatin  500 mg Intravenous Q8H  . mirabegron ER  25 mg Oral QHS  . olopatadine  1 drop Both Eyes BID  . polyethylene glycol  17 g Oral QHS  . polyvinyl alcohol   Both Eyes BID  . sertraline  50 mg Oral Q breakfast   Continuous Infusions: . sodium chloride 1,000 mL (04/18/16 0710)   PRN Meds:.acetaminophen **OR** acetaminophen, albuterol, ondansetron **OR** ondansetron (ZOFRAN) IV, senna   PHYSICAL EXAM: Vital signs: Vitals:   04/17/16 1623 04/17/16 1936 04/17/16 2257 04/18/16 0415  BP: 119/76  (!) 145/73 (!) 144/71  Pulse: 64  65 71  Resp: 15  16 16   Temp: 99 F (37.2 C)  97.7 F (36.5 C) 97.8 F (36.6 C)  TempSrc: Axillary  Oral Oral  SpO2: 100% 100% 100%    There were no vitals filed for this visit. There is no height or weight on file to calculate BMI.   Gen Exam:Much more awake and alert, follows all my commands. She appears chronically ill but not in any distress.  Neck: Supple, No JVD.   Chest: B/L Clear.   CVS: S1 S2 Regular, no murmurs.  Abdomen: soft, BS +, non tender, non distended. Foley catheter in place Extremities: no edema, lower extremities warm to touch. Neurologic: Non Focal.  Left upper extremity appears contracted-chronic issue per daughter. Skin: No Rash or lesions   Wounds: N/A.    LABORATORY DATA: CBC:  Recent Labs Lab 04/17/16 1200 04/18/16 0449  WBC 8.0 7.2  NEUTROABS 5.6  --   HGB 11.1* 10.9*  HCT 36.0 35.5*  MCV 86.3 86.4  PLT 189 [id number]    Basic Metabolic Panel:  Recent Labs Lab 04/17/16 1200 04/18/16 0449  NA 139 138  K 4.3 3.9  CL 107 108  CO2 28 24  GLUCOSE 88 99  BUN 14 10  CREATININE 0.70 0.59  CALCIUM 9.8 9.2    GFR: CrCl cannot be calculated (Unknown ideal weight.).  Liver Function Tests:  Recent Labs Lab 04/17/16 1200  AST 16  ALT 10*  ALKPHOS 84  BILITOT 0.3  PROT 7.1  ALBUMIN 3.6   No results for input(s): LIPASE, AMYLASE in the last 168 hours. No  results for input(s): AMMONIA in the last 168 hours.  Coagulation Profile: No results for input(s): INR, PROTIME in the last 168 hours.  Cardiac Enzymes: No results for input(s): CKTOTAL, CKMB, CKMBINDEX, TROPONINI in the last 168 hours.  BNP (last 3 results) No results for input(s): PROBNP in the last 8760 hours.  HbA1C: No results for input(s): HGBA1C in the last 72 hours.  CBG: No results for input(s): GLUCAP in the last 168 hours.  Lipid Profile: No results for input(s): CHOL, HDL, LDLCALC, TRIG, CHOLHDL, LDLDIRECT in the last 72 hours.  Thyroid Function Tests: No results for input(s): TSH, T4TOTAL, FREET4, T3FREE, THYROIDAB in the last 72 hours.  Anemia Panel: No results for input(s): VITAMINB12, FOLATE, FERRITIN, TIBC, IRON, RETICCTPCT in the last 72 hours.  Urine analysis:    Component Value Date/Time   COLORURINE YELLOW 04/17/2016 1123   APPEARANCEUR CLOUDY (A) 04/17/2016 1123   APPEARANCEUR Cloudy 02/08/2014 1200   LABSPEC 1.013 04/17/2016 1123   LABSPEC 1.014 02/08/2014 1200   PHURINE 6.5 04/17/2016 1123  GLUCOSEU NEGATIVE 04/17/2016 1123   GLUCOSEU Negative 02/08/2014 1200   HGBUR SMALL (A) 04/17/2016 1123   BILIRUBINUR NEGATIVE 04/17/2016 1123   BILIRUBINUR Negative 02/08/2014 1200   KETONESUR NEGATIVE 04/17/2016 1123   PROTEINUR NEGATIVE 04/17/2016 1123   UROBILINOGEN 0.2 12/21/2013 1132   NITRITE NEGATIVE 04/17/2016 1123   LEUKOCYTESUR LARGE (A) 04/17/2016 1123   LEUKOCYTESUR 2+ 02/08/2014 1200    Sepsis Labs: Lactic Acid, Venous    Component Value Date/Time   LATICACIDVEN 0.93 04/17/2016 1215    MICROBIOLOGY: Recent Results (from the past 240 hour(s))  Gram stain     Status: None   Collection Time: 04/17/16 11:23 AM  Result Value Ref Range Status   Specimen Description URINE, SUPRAPUBIC  Final   Special Requests NONE  Final   Gram Stain   Final    CYTOSPIN SMEAR WBC PRESENT,BOTH PMN AND MONONUCLEAR GRAM NEGATIVE RODS    Report Status  04/17/2016 FINAL  Final  MRSA PCR Screening     Status: None   Collection Time: 04/17/16  7:10 PM  Result Value Ref Range Status   MRSA by PCR NEGATIVE NEGATIVE Final    Comment:        The GeneXpert MRSA Assay (FDA approved for NASAL specimens only), is one component of a comprehensive MRSA colonization surveillance program. It is not intended to diagnose MRSA infection nor to guide or monitor treatment for MRSA infections.     RADIOLOGY STUDIES/RESULTS: Ct Head Wo Contrast  Result Date: 04/17/2016 CLINICAL DATA:  Altered mental status and unresponsive pupils EXAM: CT HEAD WITHOUT CONTRAST TECHNIQUE: Contiguous axial images were obtained from the base of the skull through the vertex without intravenous contrast. COMPARISON:  February 24, 2016 FINDINGS: Brain: Moderate diffuse atrophy is stable. There is no intracranial mass, hemorrhage, extra-axial fluid collection, or midline shift. There is extensive small vessel disease throughout the centra semiovale bilaterally. There are prior infarcts in the right internal capsule at multiple sites with extension of infarct into the inferior posterior right basal ganglia. No new gray-white compartment lesions are identified. No acute infarct evident. Vascular: No hyperdense vessels. There is calcification in each carotid siphon as well as in each proximal middle cerebral artery. Skull: Bony calvarium appears intact. Sinuses/Orbits: Visualized paranasal sinuses are clear. Orbits appear symmetric bilaterally. Other: Mastoid air cells are clear. IMPRESSION: Atrophy with extensive supratentorial small vessel disease. Prior right basal ganglia region infarcts. No acute infarct evident. No hemorrhage or mass effect. Areas of vascular calcification. Electronically Signed   By: Lowella Grip III M.D.   On: 04/17/2016 13:19   Dg Chest Portable 1 View  Result Date: 04/17/2016 CLINICAL DATA:  Altered mental status, wheezing. Patient unresponsive. EXAM: PORTABLE  CHEST 1 VIEW COMPARISON:  02/24/2016 FINDINGS: Cardiomegaly with vascular congestion. Probable aneurysmal dilatation of the aortic arch with scattered calcifications. No confluent airspace opacities or effusions. No overt edema. No acute bony abnormality. IMPRESSION: Cardiomegaly with vascular congestion. Aneurysmal dilatation of the aortic arch. Electronically Signed   By: Rolm Baptise M.D.   On: 04/17/2016 12:19     LOS: 1 day   Oren Binet, MD  [facility] Pager:[phone]  If 7PM-7AM, please contact night-coverage [email or web address] Password [email or web address] 04/18/2016, 7:19 AM

## 2016-04-18 NOTE — Plan of Care (Signed)
Problem: Pain Managment: Goal: General experience of comfort will improve Outcome: Progressing Patient denies pain, comfortable.

## 2016-04-18 NOTE — Evaluation (Addendum)
Clinical/Bedside Swallow Evaluation Patient Details  Name: [name] MRN: [id number] Date of Birth: 1926/04/19  Today's Date: 04/18/2016 Time: SLP Start Time (ACUTE ONLY): 1155 SLP Stop Time (ACUTE ONLY): 1230 SLP Time Calculation (min) (ACUTE ONLY): 35 min  Past Medical History:  Past Medical History:  Diagnosis Date  . Acute sinusitis   . Anemia   . Arthritis   . Breast cancer ([place])   . Cervical spondylosis   . CHF (congestive heart failure) (HCC)    chronic and stable per MD note  . Chronic kidney disease    chronic kidney disease per MD notes  . Chronic schizophrenia ([place])   . COPD (chronic obstructive pulmonary disease) ([place])   . Dementia   . Depressive disorder   . DM (diabetes mellitus) ([place])    DIET CONTROLLED  . Foley catheter in place   . GERD (gastroesophageal reflux disease)   . Glaucoma   . Hyperlipidemia   . Hypertension   . On home oxygen therapy    2 L PER NASAL CANNULA  . Osteoporosis   . Schizophrenia ([place])   . Stenosis of lumbosacral spine   . Stroke (H[facility])    L hemiparesis  . Wheelchair dependent    Past Surgical History:  Past Surgical History:  Procedure Laterality Date  . APPENDECTOMY    . BREAST SURGERY     breast cancer  . CATARACT EXTRACTION, BILATERAL    . CHOLECYSTECTOMY    . INSERTION OF SUPRAPUBIC CATHETER N/A 04/29/2014   Procedure: INSERTION OF SUPRAPUBIC CATHETER;  Surgeon: Arvil Persons, MD;  Location: [facility];  Service: Urology;  Laterality: N/A;  . JOINT REPLACEMENT     L TOTAL KNEE  . UTERINE SUSPENSION     HPI:  82 yo female adm to [facility] with complicated UTI causing acute encephalopathy.  PMH + for COPD, UTIs, esophageal dysmotility, reflux, prior basal ganglia CVA - right.  CXR 8/2 showed vascular congestion.  Pt is wheelchair bound at facility.      Assessment / Plan / Recommendation Clinical Impression  Pt presents with functional oropharyngeal swallow based on clinical swallow evaluation.  She does have h/o esophageal  dysmotilty, reflux and small hiatal hernia per esophagram 01/2007.  Pt able to self feed with set up - observed her consuming lunch - brocolli, fish, mashed potatoes and water.  Due to edentulous status, she was unable to masticate, however no oral residuals nor significant deficits noted.    Recommend continue dys3/thin diet with precautions.  using teach back,educated pt to need to take small bites/sips and drink liquids throughout meals.  SLP to sign off, no follow up indicated.    Anticipate dys3/thin diet needed long term due to lack of dentition *pt reports she lost her dentures.  Informed RN of findings.      Aspiration Risk  Mild aspiration risk    Diet Recommendation Dysphagia 3 (Mech soft);Thin liquid   Liquid Administration via: Cup;Straw Medication Administration: Whole meds with puree Supervision: Patient able to self feed Compensations: Slow rate;Small sips/bites (drink liquids t/o meal) Postural Changes: Seated upright at 90 degrees;Remain upright for at least 30 minutes after po intake    Other  Recommendations Oral Care Recommendations: Oral care BID   Follow up Recommendations       Frequency and Duration   n/a         Prognosis        Swallow Study   General Date of Onset: 04/18/16 HPI: 82  yo female adm to [facility] with complicated UTI causing acute encephalopathy.  PMH + for COPD, UTIs, esophageal dysmotility, reflux, prior basal ganglia CVA - right.  CXR 8/2 showed vascular congestion.  Pt is wheelchair bound at facility.    Type of Study: Bedside Swallow Evaluation Diet Prior to this Study: Thin liquids;Dysphagia 3 (soft) Temperature Spikes Noted: No Respiratory Status: Nasal cannula History of Recent Intubation: No Behavior/Cognition: Alert;Cooperative;Pleasant mood Oral Cavity Assessment: Within Functional Limits Oral Care Completed by SLP: Yes Oral Cavity - Dentition: Edentulous (pt reports she lost her dentures) Vision: Functional for  self-feeding Self-Feeding Abilities: Able to feed self Patient Positioning: Upright in bed Baseline Vocal Quality: Normal Volitional Cough: Strong Volitional Swallow: Unable to elicit    Oral/Motor/Sensory Function Overall Oral Motor/Sensory Function: Generalized oral weakness (minimal general weakness)   Ice Chips Ice chips: Not tested   Thin Liquid Thin Liquid: Within functional limits Presentation: Straw    Nectar Thick Nectar Thick Liquid: Not tested   Honey Thick Honey Thick Liquid: Not tested   Puree Puree: Within functional limits Presentation: Self Fed;Spoon   Solid   GO   Solid: Impaired Presentation: Self Fed Oral Phase Impairments: Reduced lingual movement/coordination;Poor awareness of bolus Oral Phase Functional Implications: Impaired mastication;Oral residue Other Comments: poor "mastication" but adequate from observation without oral residuals        Luanna Salk, [facility] SLP [phone]

## 2016-04-18 NOTE — Evaluation (Signed)
Physical Therapy Evaluation Patient Details Name: [name] MRN: [id number] DOB: 15-Feb-1926 Today's Date: 04/18/2016   History of Present Illness  82 year old female with a history of COPD, dementia, CVA with left hemiparesis, schizophrenia presented from [facility] secondary to acute mental status change. Acute encehalopathy secondary to UTI  Clinical Impression  Pt with generalized weakness and decreased alert levels as briefly told by family friends. PT was respondent to all my commands, worked with therapy at edge of bed for balance and trunk initiation, as well as mild LE exercises. Pt refused out of bed at this time, however notified nurse to recheck later and use lift if agreeable. Due to weakness and ability to follow commands and work on sitting and transfers will work with to increase ability in these areas.     Follow Up Recommendations SNF    Equipment Recommendations       Recommendations for Other Services       Precautions / Restrictions        Mobility  Bed Mobility Overal bed mobility: +2 for physical assistance;Needs Assistance Bed Mobility: Supine to Sit;Sit to Supine     Supine to sit: +2 for physical assistance;Max assist (pt did help with getting LE to edge of bed with verbal and tactile cues) Sit to supine: Max assist;+2 for physical assistance   General bed mobility comments: positioned in such a way to help prevent pt from preferred R elbow flexion position to prevent flexion contracture on this side. Aslo positioned turned to R side and heels floating. Nurse alerted   Transfers Overall transfer level:  (pt refused OOB to recliner today. Explained to nurse to encourage and use lift when agreeable. )                             Pertinent Vitals/Pain Pain Assessment:  (no apparent movments that seemed to cause pain. )    Home Living Family/patient expects to be discharged to:: Skilled nursing facility (from [place])                       Prior Function Level of Independence: Needs assistance         Comments: Pt sated PT had worked with her in the past, but not working wiht her now. PAtient stated she would like them to begin again.         Extremity/Trunk Assessment   Upper Extremity Assessment:  (noted L UE with flexion contracture at elbow and hand contracture. Skin intact in cubicle and palm . R UE pt perfers this arm in flexion position however able to extend to -20 from extenion. Some AAROM for flexion /extension exercises at the elbow. )           Lower Extremity Assessment: Generalized weakness (noted B DF tightness, worked with DF gentle stretches. And all movments AAROM in limited end range. stiff movment patterns. )      Cervical / Trunk Assessment: Kyphotic (neck and your thoracic kyphosis)  Communication   Communication: Receptive difficulties;Expressive difficulties (however responded to all commands appropriately and answered with short one word or few word phrases, low volume. )  Cognition Arousal/Alertness: Awake/alert (had to at times stimulate with tactile or verbal to increae alert levels or awareness of task. ) Behavior During Therapy: Flat affect Overall Cognitive Status: No family/caregiver present to determine baseline cognitive functioning  General Comments      Exercises Other Exercises Other Exercises: sat edge of bed for 10 minute interval with +2 min to Max variation of assist at times. Cued patient tactile and verbal to try to help with maintaining sitting balance and engaged trunk and neck muscles for extension as well.  Other Exercises: while in sitting prompted pt to use R UE for support on bed, and AAROM for knee extsion exercises 5x each BLE, and toe tapping exercises 5x each LE.       Assessment/Plan    PT Assessment Patient needs continued PT services  PT Diagnosis Generalized weakness (decreased ability for sitting balance and  tranasfers )   PT Problem List Decreased strength;Decreased range of motion;Decreased activity tolerance;Decreased balance  PT Treatment Interventions Functional mobility training;Therapeutic activities;Therapeutic exercise;Balance training;Patient/family education   PT Goals (Current goals can be found in the Care Plan section) Acute Rehab PT Goals Patient Stated Goal: Pt did state she would like PT to work with her sitting EOB like we did today.  PT Goal Formulation: Patient unable to participate in goal setting Time For Goal Achievement: 05/02/16 Potential to Achieve Goals: Fair    Frequency Min 2X/week   Barriers to discharge           End of Session   Activity Tolerance: Patient tolerated treatment well;Patient limited by lethargy Patient left: in bed;with call bell/phone within reach Nurse Communication: Mobility status;Need for lift equipment         Time: 1050-1121 PT Time Calculation (min) (ACUTE ONLY): 31 min   Charges:   PT Evaluation $PT Eval Moderate Complexity: 1 Procedure PT Treatments $Therapeutic Activity: 8-22 mins         Etrulia Zarr 04/18/2016, 12:05 PM  Clide Dales, PT Pager: [phone] 04/18/2016

## 2016-04-18 NOTE — NC FL2 (Signed)
[place] LEVEL OF CARE SCREENING TOOL     IDENTIFICATION  Patient Name: [name] Birthdate: 1925-10-18 Sex: female Admission Date (Current Location): 04/17/2016  [place] and [place] Number:  Kathleen Argue [id number] [place] and Address:  [facility], [place] [address], [place], [place] [address]      Provider Number: [id number]  Attending Physician Name and Address:  Jonetta Osgood, MD  Relative Name and Phone Number:  Clara Sitts, daughter. (Cell) [phone] and (H) [phone]    Current Level of Care: Hospital Recommended Level of Care: Nursing Facility Prior Approval Number:    Date Approved/Denied:   PASRR Number:    Discharge Plan: SNF    Current Diagnoses: Patient Active Problem List   Diagnosis Date Noted  . Pressure ulcer 04/18/2016  . Complicated UTI (urinary tract infection) 04/17/2016  . Acute encephalopathy 04/17/2016  . Diabetes mellitus type 2 in obese ([place]) 03/27/2016  . History of CVA with residual deficit 03/01/2016  . Chronic constipation 03/01/2016  . Chronic major depressive disorder ([place]) 03/01/2016  . Anemia, chronic disease 03/01/2016  . ESBL (extended spectrum beta-lactamase) producing bacteria infection 02/29/2016  . Altered mental status 02/25/2016  . Encephalopathy acute 02/25/2016  . Urinary tract infection 02/25/2016  . Type 2 diabetes mellitus with diabetic neuropathy, without long-term current use of insulin ([place]) 01/10/2016  . Allergic conjunctivitis and rhinitis 12/07/2015  . Essential hypertension, benign 11/10/2015  . Constipation, chronic 11/10/2015  . Hyperlipidemia 04/15/2015  . B12 deficiency 04/15/2015  . Depression due to dementia 03/02/2015  . Neurogenic dysfunction of the urinary bladder 01/09/2015  . Major depressive disorder, recurrent, in remission ([place]) 01/09/2015  . Pain, neuropathic 01/09/2015  . Hypertensive heart and renal disease with congestive heart failure ([place])  01/09/2015  . Chronic systolic heart failure ([place]) 01/09/2015  . Type 2 diabetes mellitus with polyneuropathy ([place]) 01/09/2015  . Chronic respiratory failure with hypoxia ([place]) 10/14/2014  . CKD stage 2 due to type 2 diabetes mellitus ([place]) 10/14/2014  . Neuropathic pain 09/15/2014  . Senile osteoporosis 09/15/2014  . Primary generalized (osteo)arthritis 09/15/2014  . Neurogenic bladder 06/13/2014  . Hemiplegia of nondominant side following CVA (cerebrovascular accident) ([place]) 06/13/2014  . Chronic respiratory failure, unspecified whether with hypoxia or hypercapnia ([place]) 06/13/2014  . Osteoporosis, unspecified 06/13/2014  . Anemia of other chronic disease 06/13/2014  . Vascular dementia 04/14/2014  . Schizophrenia, chronic condition ([place]) 04/14/2014  . Benign hypertensive heart and kidney disease with heart failure and with chronic kidney disease stage I through stage IV, or unspecified(404.11) 04/14/2014  . Type II or unspecified type diabetes mellitus with neurological manifestations, not stated as uncontrolled 04/14/2014  . Urinary retention with incomplete bladder emptying 04/12/2014  . Unspecified vitamin D deficiency 02/28/2014  . Hemiplegia affecting nondominant side, late effect of cerebrovascular disease 02/28/2014  . Polyneuropathy in diabetes(357.2) 02/28/2014  . Esophageal reflux 02/28/2014  . Peripheral angiopathy in diseases classified elsewhere ([place]) 02/28/2014  . Anemia   . Chronic schizophrenia ([place])   . Dementia   . Depressive disorder   . Stroke ([place])   . COPD (chronic obstructive pulmonary disease) ([place]) 05/13/2012    Orientation RESPIRATION BLADDER Height & Weight     Self   (2L) Indwelling catheter Weight:   Height:     BEHAVIORAL SYMPTOMS/MOOD NEUROLOGICAL BOWEL NUTRITION STATUS      Continent Diet  AMBULATORY STATUS COMMUNICATION OF NEEDS Skin   Extensive Assist Verbally Skin abrasions, Other (Comment) ( Buttocks Location Orientation:  Left Staging:  Stage I - Intact skin with non-blanchable redness of a localized area usually over a bony prominence)                       Personal Care Assistance Level of Assistance  Bathing, Feeding, Dressing Bathing Assistance: Limited assistance Feeding assistance: Limited assistance Dressing Assistance: Limited assistance     Functional Limitations Info  Sight, Speech, Hearing Sight Info: Adequate Hearing Info: Adequate Speech Info: Adequate    SPECIAL CARE FACTORS FREQUENCY  PT (By licensed PT), Speech therapy     PT Frequency: 5       Speech Therapy Frequency: 5      Contractures Contractures Info: Not present    Additional Factors Info  Isolation Precautions Code Status Info: DNR Allergies Info: Codeine, Lisinopril,      Isolation Precautions Info: MRSA     Current Medications (04/18/2016):  This is the current hospital active medication list Current Facility-Administered Medications  Medication Dose Route Frequency Provider Last Rate Last Dose  . 0.9 %  sodium chloride infusion   Intravenous Continuous Jonetta Osgood, MD 10 mL/hr at 04/18/16 0719    . acetaminophen (TYLENOL) tablet 650 mg  650 mg Oral Q6H PRN Shanker Kristeen Mans, MD       Or  . acetaminophen (TYLENOL) suppository 650 mg  650 mg Rectal Q6H PRN Jonetta Osgood, MD      . albuterol (PROVENTIL) (2.5 MG/3ML) 0.083% nebulizer solution 2.5 mg  2.5 mg Nebulization TID Jonetta Osgood, MD   2.5 mg at 04/18/16 0842  . albuterol (PROVENTIL) (2.5 MG/3ML) 0.083% nebulizer solution 2.5 mg  2.5 mg Nebulization Q2H PRN Jonetta Osgood, MD      . amLODipine (NORVASC) tablet 5 mg  5 mg Oral Q breakfast Jonetta Osgood, MD   5 mg at 04/18/16 0836  . antiseptic oral rinse (CPC / CETYLPYRIDINIUM CHLORIDE 0.05%) solution 7 mL  7 mL Mouth Rinse q12n4p Jonetta Osgood, MD   7 mL at 04/18/16 1200  . aspirin chewable tablet 81 mg  81 mg Oral Q breakfast Jonetta Osgood, MD   81 mg at 04/18/16 0836  . budesonide  (PULMICORT) nebulizer solution 0.5 mg  0.5 mg Nebulization BID Jonetta Osgood, MD   0.5 mg at 04/18/16 0851  . chlorhexidine (PERIDEX) 0.12 % solution 15 mL  15 mL Mouth Rinse BID Jonetta Osgood, MD   15 mL at 04/18/16 1013  . cholecalciferol (VITAMIN D) tablet 2,000 Units  2,000 Units Oral QPM Shanker Kristeen Mans, MD      . cyanocobalamin tablet 500 mcg  500 mcg Oral Q breakfast Jonetta Osgood, MD   500 mcg at 04/18/16 1007  . cycloSPORINE (RESTASIS) 0.05 % ophthalmic emulsion 1 drop  1 drop Both Eyes BID Jonetta Osgood, MD   1 drop at 04/18/16 1012  . donepezil (ARICEPT) tablet 10 mg  10 mg Oral QHS Jonetta Osgood, MD   10 mg at 04/17/16 2120  . enoxaparin (LOVENOX) injection 40 mg  40 mg Subcutaneous Q24H Jonetta Osgood, MD   40 mg at 04/18/16 1009  . fluticasone (FLONASE) 50 MCG/ACT nasal spray 2 spray  2 spray Each Nare Daily Jonetta Osgood, MD   2 spray at 04/18/16 1011  . hydrALAZINE (APRESOLINE) tablet 25 mg  25 mg Oral BID Jonetta Osgood, MD   25 mg at 04/18/16 1000  . imipenem-cilastatin (PRIMAXIN)  500 mg in sodium chloride 0.9 % 100 mL IVPB  500 mg Intravenous Q8H Anh P Pham, RPH   500 mg at 04/18/16 0835  . mirabegron ER (MYRBETRIQ) tablet 25 mg  25 mg Oral QHS Shanker Kristeen Mans, MD      . olopatadine (PATANOL) 0.1 % ophthalmic solution 1 drop  1 drop Both Eyes BID Jonetta Osgood, MD   1 drop at 04/18/16 1013  . ondansetron (ZOFRAN) tablet 4 mg  4 mg Oral Q6H PRN Shanker Kristeen Mans, MD       Or  . ondansetron (ZOFRAN) injection 4 mg  4 mg Intravenous Q6H PRN Shanker Kristeen Mans, MD      . polyethylene glycol (MIRALAX / GLYCOLAX) packet 17 g  17 g Oral QHS Shanker Kristeen Mans, MD      . polyvinyl alcohol (LIQUIFILM TEARS) 1.4 % ophthalmic solution   Both Eyes BID Jonetta Osgood, MD   1 drop at 04/18/16 1016  . senna (SENOKOT) tablet 8.6 mg  1 tablet Oral Daily PRN Jonetta Osgood, MD      . sertraline (ZOLOFT) tablet 50 mg  50 mg Oral Q breakfast Jonetta Osgood, MD   50 mg at 04/18/16 [id number]     Discharge Medications: Please see discharge summary for a list of discharge medications.  Relevant Imaging Results:  Relevant Lab Results:   Additional Information [id number]  Lia Hopping, LCSW

## 2016-04-18 NOTE — Care Management Note (Signed)
Case Management Note  Patient Details  Name: [name] MRN: [id number] Date of Birth: June 02, 1926  Subjective/Objective:    82 yo admitted with complicated UTI                Action/Plan: From SNF and to DC back to SNF at discharge.  Expected Discharge Date:   (unknown)               Expected Discharge Plan:  Skilled Nursing Facility  In-House Referral:  Clinical Social Work  Discharge planning Services  CM Consult  Post Acute Care Choice:    Choice offered to:     DME Arranged:    DME Agency:     HH Arranged:    [place] Agency:     Status of Service:  In process, will continue to follow  If discussed at Long Length of Stay Meetings, dates discussed:    Additional CommentsLynnell Catalan, RN 04/18/2016, 1:05 PM  [phone]

## 2016-04-18 NOTE — Progress Notes (Signed)
SLP Cancellation Note  Patient Details Name: DANIXA LAFOSSE MRN: [id number] DOB: 1925-11-17   Cancelled treatment:       Reason Eval/Treat Not Completed:  (pt starting to work with PT at this time; SLP to reattempt at another time as schedule allows)   Claudie Fisherman, [place] [facility] SLP [phone]

## 2016-04-18 NOTE — Progress Notes (Signed)
LCSWA following patient, will assist with disposition back to SNF once medically stable.

## 2016-04-19 LAB — URINE CULTURE: Culture: >=100000 — AB

## 2016-04-19 MED ORDER — CIPROFLOXACIN HCL 500 MG PO TABS
500.0000 mg | ORAL_TABLET | Freq: Two times a day (BID) | ORAL | Status: DC
  Administered 2016-04-19: 500 mg via ORAL
  Filled 2016-04-19: qty 1

## 2016-04-19 MED ORDER — CIPROFLOXACIN HCL 500 MG PO TABS: 500.0000 mg | ORAL_TABLET | Freq: Two times a day (BID) | ORAL | Status: DC

## 2016-04-19 NOTE — Progress Notes (Signed)
Report given to nurse Juliann Pulse at [facility], all questions were answered appropriately.

## 2016-04-19 NOTE — Progress Notes (Signed)
Disposition: Clinicals and DC Summary Faxed to facility. Facility ready for patient at this time.  LCSWA met with RN and patient beside, confirmed discharge and return to [facility]. Spoke with Patient daughter Shireen Quan [phone], plans to meet pt. At facility RN given Report.  Per DC Summary patient is FULLCODE, [facility] called for transport.   Kathrin Greathouse, Latanya Presser, MSW Clinical Social Worker 5E and Psychiatric Service Line [phone] 04/19/2016  10:24 AM

## 2016-04-19 NOTE — Progress Notes (Signed)
Patient d/c'd to SNF- [facility] via non emergency ambulance. Patient is  alert  and stable at the time of d/c. No pain.

## 2016-04-19 NOTE — Progress Notes (Signed)
First dose of po antibiotic- cipro given per order.

## 2016-04-19 NOTE — Discharge Summary (Signed)
PATIENT DETAILS Name: [name] Age: 82 y.o. Sex: female Date of Birth: [birth year] MRN: [id number]. Admitting Physician: Jonetta Osgood, MD [id number], [facility], MD  Admit Date: 04/17/2016 Discharge date: 04/19/2016  Recommendations for Outpatient Follow-up:  1. Follow up with PCP in 1-2 weeks 2. Please obtain BMP/CBC in one week  Admitted From:  SNF  Disposition: SNF   Home Health: No  Equipment/Devices: oxygen 2 l/m (at baseline) Suprapubic catheter-changed by Urology on 04/17/16  Discharge Condition: Stable  CODE STATUS: FULL CODE (Was DNR inpatient-family does not want to continue on discharge)  Diet recommendation:   Dysphagia 3 with thin liquids with standard aspiration precautions  Brief Summary: See H&P, Labs, Consult and Test reports for all details in brief, Patient is a 82 y.o. female with past medical history of ESBL Escherichia coli UTI in June 2017, dementia, COPD on home oxygen, schizophrenia, hypertension, diet-controlled diabetes, chronic suprapubic catheter due to neurogenic bladder- who was admitted for acute encephalopathy secondary to a complicated UTI. Much improved following initiation of IV antibiotics, urine cultures positive for pansensitive pseudomonas, being transitioned to ciprofloxacin on discharge.  Brief Hospital Course: Complicated UTI (urinary tract infection):Significantly improved, with resolution of encephalopathy, afebrile and without leukocytosis.  Did have dysuria/pelvic pain, was encephalopathic on admission-now has significantly improved with intravenous Primaxin. Urine cultures positive for pansensitive pseudomonas, she will be transitioned to ciprofloxacin on discharge.   Acute metabolic encephalopathy: resolved, likely due to above. CT head negative for acute abnormalities.   Hypertension: Controlled, continue amlodipine and hydralazine  Vascular dementia: encephalopathy has resolved-does have moderate amount of  dementia at baseline- continue Aricept and Zoloft  Schizophrenia, chronic condition:Appears to be on fluphenazine injections monthly-resume upon discharge.   Neurogenic dysfunction of the urinary bladder-with suprapubic catheter in place: Suprapubic catheter changed by [facility] on 8/2-draining well. Continue to follow with [facility] as previously scheduled  Chronic hypoxic respiratory failure:Suspect from underlying COPD, continue home oxygen regimen.  COPD (chronic obstructive pulmonary disease):Lungs clear to auscultation-continue as  bronchodilators.  B12 deficiency:Continue vitamin B12 supplementation  History of dysphagia:Has had a prior history of CVA-continue dysphagia 3 diet on discharge-speech therapy evaluation appreciated. Per daughter, patient was on a dysphagia 3 diet prior to admission as well.  History of prior CVA: Continue aspirin-although encephalopathic-exam appears nonfocal this afternoon.  Left upper extremity contracture: Per daughter this occurred after a fall and a rotator cuff tear. Continue supportive care. Defer to outpatient M.D. for further evaluation if indicated.  History of spinal stenosis: Supportive care-chronic issue-limiting patient's ambulation  Discharge Diagnoses:  Active Problems:   COPD (chronic obstructive pulmonary disease) (HCC)   Vascular dementia   Schizophrenia, chronic condition (HCC)   Chronic respiratory failure, unspecified whether with hypoxia or hypercapnia (HCC)   Neurogenic dysfunction of the urinary bladder   Hyperlipidemia   B12 deficiency   Diabetes mellitus type 2 in obese (HCC)   Complicated UTI (urinary tract infection)   Acute encephalopathy   Pressure ulcer   Discharge Instructions: Discharge Instructions    Call MD for:  extreme fatigue    Complete by:  As directed   Call MD for:  persistant dizziness or light-headedness    Complete by:  As directed   Diet - low sodium heart healthy     Complete by:  As directed   Increase activity slowly    Complete by:  As directed       Medication List    TAKE these medications   acetaminophen 500  MG tablet Commonly known as:  TYLENOL Take 500 mg by mouth every 8 (eight) hours as needed for mild pain or fever.   albuterol (2.5 MG/3ML) 0.083% nebulizer solution Commonly known as:  PROVENTIL Take 2.5 mg by nebulization 3 (three) times daily.   amLODipine 5 MG tablet Commonly known as:  NORVASC Take 5 mg by mouth daily with breakfast.   aspirin 81 MG chewable tablet Chew 1 tablet (81 mg total) by mouth daily. What changed:  when to take this   budesonide 0.5 MG/2ML nebulizer solution Commonly known as:  PULMICORT Take 0.5 mg by nebulization 2 (two) times daily.   ciprofloxacin 500 MG tablet Commonly known as:  CIPRO Take 1 tablet (500 mg total) by mouth 2 (two) times daily. 3 more days from 04/20/16   cycloSPORINE 0.05 % ophthalmic emulsion Commonly known as:  RESTASIS Place 1 drop into both eyes 2 (two) times daily.   donepezil 10 MG tablet Commonly known as:  ARICEPT Take 10 mg by mouth at bedtime.   fluPHENAZine decanoate 25 MG/ML injection Commonly known as:  PROLIXIN Inject 25 mg into the muscle every 30 (thirty) days.   fluticasone 50 MCG/ACT nasal spray Commonly known as:  FLONASE Place 2 sprays into the nose every evening. Reported on 11/10/2015   hydrALAZINE 25 MG tablet Commonly known as:  APRESOLINE Take 25 mg by mouth 2 (two) times daily.   MYRBETRIQ 25 MG Tb24 tablet Generic drug:  mirabegron ER Take 25 mg by mouth at bedtime.   olopatadine 0.1 % ophthalmic solution Commonly known as:  PATANOL Place 1 drop into both eyes 2 (two) times daily.   OXYGEN Inhale 2 L into the lungs continuous.   polyethylene glycol packet Commonly known as:  MIRALAX / GLYCOLAX Take 17 g by mouth daily. What changed:  when to take this   senna 8.6 MG Tabs tablet Commonly known as:  SENOKOT Take 1 tablet (8.6  mg total) by mouth daily as needed for mild constipation.   sertraline 50 MG tablet Commonly known as:  ZOLOFT Take 50 mg by mouth daily with breakfast.   SYSTANE BALANCE OP Place 1 drop into both eyes 2 (two) times daily.   Vitamin B-12 1000 MCG Subl Place 0.5 tablets under the tongue daily with breakfast.   Vitamin D3 2000 units Tabs Take 2,000 Units by mouth every evening. For vitamin D deficiency      Follow-up Information    PANDEY, MAHIMA, MD. Schedule an appointment as soon as possible for a visit in 1 week(s).   Specialty:  Internal Medicine Contact information: [address] [place] [address] [phone]          Allergies  Allergen Reactions  . Codeine     Documented on MAR  . Lisinopril     Per MAR leg weakness  . Other     PAIN MEDICATIONS "KNOCK PT OUT" FOR SEVERAL DAYS.  PT ACCEPTS NO BLOOD PRODUCTS.     Activity:  As tolerated with Full fall precautions use walker/cane & assistance as needed  Consultations:   None   Procedures/Studies: Ct Head Wo Contrast  Result Date: 04/17/2016 CLINICAL DATA:  Altered mental status and unresponsive pupils EXAM: CT HEAD WITHOUT CONTRAST TECHNIQUE: Contiguous axial images were obtained from the base of the skull through the vertex without intravenous contrast. COMPARISON:  February 24, 2016 FINDINGS: Brain: Moderate diffuse atrophy is stable. There is no intracranial mass, hemorrhage, extra-axial fluid collection, or midline shift. There  is extensive small vessel disease throughout the centra semiovale bilaterally. There are prior infarcts in the right internal capsule at multiple sites with extension of infarct into the inferior posterior right basal ganglia. No new gray-white compartment lesions are identified. No acute infarct evident. Vascular: No hyperdense vessels. There is calcification in each carotid siphon as well as in each proximal middle cerebral artery. Skull: Bony calvarium appears intact.  Sinuses/Orbits: Visualized paranasal sinuses are clear. Orbits appear symmetric bilaterally. Other: Mastoid air cells are clear. IMPRESSION: Atrophy with extensive supratentorial small vessel disease. Prior right basal ganglia region infarcts. No acute infarct evident. No hemorrhage or mass effect. Areas of vascular calcification. Electronically Signed   By: Lowella Grip III M.D.   On: 04/17/2016 13:19   Dg Chest Portable 1 View  Result Date: 04/17/2016 CLINICAL DATA:  Altered mental status, wheezing. Patient unresponsive. EXAM: PORTABLE CHEST 1 VIEW COMPARISON:  02/24/2016 FINDINGS: Cardiomegaly with vascular congestion. Probable aneurysmal dilatation of the aortic arch with scattered calcifications. No confluent airspace opacities or effusions. No overt edema. No acute bony abnormality. IMPRESSION: Cardiomegaly with vascular congestion. Aneurysmal dilatation of the aortic arch. Electronically Signed   By: Rolm Baptise M.D.   On: 04/17/2016 12:19       TODAY-DAY OF DISCHARGE:  Subjective:   [name] today has no headache,no chest abdominal pain,no new weakness tingling or numbness  Objective:   Blood pressure (!) 121/51, pulse 67, temperature 97.6 F (36.4 C), temperature source Oral, resp. rate 16, SpO2 100 %.  Intake/Output Summary (Last 24 hours) at 04/19/16 0902 Last data filed at 04/19/16 0851  Gross per 24 hour  Intake             1180 ml  Output             2550 ml  Net            -1370 ml   There were no vitals filed for this visit.  Exam: Awake Alert,  No new F.N deficits, Normal affect [place].AT,PERRAL Supple Neck,No JVD, No cervical lymphadenopathy appriciated.  Symmetrical Chest wall movement, Good air movement bilaterally, CTAB RRR,No Gallops,Rubs or new Murmurs, No Parasternal Heave +ve B.Sounds, Abd Soft, Non tender, No organomegaly appriciated, No rebound -guarding or rigidity. No Cyanosis, Clubbing or edema, No new Rash or bruise   PERTINENT RADIOLOGIC  STUDIES: Ct Head Wo Contrast  Result Date: 04/17/2016 CLINICAL DATA:  Altered mental status and unresponsive pupils EXAM: CT HEAD WITHOUT CONTRAST TECHNIQUE: Contiguous axial images were obtained from the base of the skull through the vertex without intravenous contrast. COMPARISON:  February 24, 2016 FINDINGS: Brain: Moderate diffuse atrophy is stable. There is no intracranial mass, hemorrhage, extra-axial fluid collection, or midline shift. There is extensive small vessel disease throughout the centra semiovale bilaterally. There are prior infarcts in the right internal capsule at multiple sites with extension of infarct into the inferior posterior right basal ganglia. No new gray-white compartment lesions are identified. No acute infarct evident. Vascular: No hyperdense vessels. There is calcification in each carotid siphon as well as in each proximal middle cerebral artery. Skull: Bony calvarium appears intact. Sinuses/Orbits: Visualized paranasal sinuses are clear. Orbits appear symmetric bilaterally. Other: Mastoid air cells are clear. IMPRESSION: Atrophy with extensive supratentorial small vessel disease. Prior right basal ganglia region infarcts. No acute infarct evident. No hemorrhage or mass effect. Areas of vascular calcification. Electronically Signed   By: Lowella Grip III M.D.   On: 04/17/2016 13:19   Dg Chest  Portable 1 View  Result Date: 04/17/2016 CLINICAL DATA:  Altered mental status, wheezing. Patient unresponsive. EXAM: PORTABLE CHEST 1 VIEW COMPARISON:  02/24/2016 FINDINGS: Cardiomegaly with vascular congestion. Probable aneurysmal dilatation of the aortic arch with scattered calcifications. No confluent airspace opacities or effusions. No overt edema. No acute bony abnormality. IMPRESSION: Cardiomegaly with vascular congestion. Aneurysmal dilatation of the aortic arch. Electronically Signed   By: Rolm Baptise M.D.   On: 04/17/2016 12:19     PERTINENT LAB RESULTS: CBC:  Recent  Labs  04/17/16 1200 04/18/16 0449  WBC 8.0 7.2  HGB 11.1* 10.9*  HCT 36.0 35.5*  PLT 189 190   CMET CMP     Component Value Date/Time   NA 138 04/18/2016 0449   NA 141 01/27/2016   K 3.9 04/18/2016 0449   CL 108 04/18/2016 0449   CO2 24 04/18/2016 0449   GLUCOSE 99 04/18/2016 0449   BUN 10 04/18/2016 0449   BUN 11 01/27/2016   CREATININE 0.59 04/18/2016 0449   CALCIUM 9.2 04/18/2016 0449   PROT 7.1 04/17/2016 1200   ALBUMIN 3.6 04/17/2016 1200   AST 16 04/17/2016 1200   ALT 10 (L) 04/17/2016 1200   ALKPHOS 84 04/17/2016 1200   BILITOT 0.3 04/17/2016 1200   GFRNONAA >60 04/18/2016 0449   GFRAA >60 04/18/2016 0449    GFR CrCl cannot be calculated (Unknown ideal weight.). No results for input(s): LIPASE, AMYLASE in the last 72 hours. No results for input(s): CKTOTAL, CKMB, CKMBINDEX, TROPONINI in the last 72 hours. Invalid input(s): POCBNP No results for input(s): DDIMER in the last 72 hours. No results for input(s): HGBA1C in the last 72 hours. No results for input(s): CHOL, HDL, LDLCALC, TRIG, CHOLHDL, LDLDIRECT in the last 72 hours. No results for input(s): TSH, T4TOTAL, T3FREE, THYROIDAB in the last 72 hours.  Invalid input(s): FREET3 No results for input(s): VITAMINB12, FOLATE, FERRITIN, TIBC, IRON, RETICCTPCT in the last 72 hours. Coags: No results for input(s): INR in the last 72 hours.  Invalid input(s): PT Microbiology: Recent Results (from the past 240 hour(s))  Gram stain     Status: None   Collection Time: 04/17/16 11:23 AM  Result Value Ref Range Status   Specimen Description URINE, SUPRAPUBIC  Final   Special Requests NONE  Final   Gram Stain   Final    CYTOSPIN SMEAR WBC PRESENT,BOTH PMN AND MONONUCLEAR GRAM NEGATIVE RODS    Report Status 04/17/2016 FINAL  Final  Urine culture     Status: Abnormal   Collection Time: 04/17/16  5:47 PM  Result Value Ref Range Status   Specimen Description URINE, CLEAN CATCH  Final   Special Requests NONE   Final   Culture >=100,000 COLONIES/mL PSEUDOMONAS AERUGINOSA (A)  Final   Report Status 04/19/2016 FINAL  Final   Organism ID, Bacteria PSEUDOMONAS AERUGINOSA (A)  Final      Susceptibility   Pseudomonas aeruginosa - MIC*    CEFTAZIDIME 4 SENSITIVE Sensitive     CIPROFLOXACIN <=0.25 SENSITIVE Sensitive     GENTAMICIN 2 SENSITIVE Sensitive     IMIPENEM 1 SENSITIVE Sensitive     PIP/TAZO 8 SENSITIVE Sensitive     CEFEPIME 2 SENSITIVE Sensitive     * >=100,000 COLONIES/mL PSEUDOMONAS AERUGINOSA  MRSA PCR Screening     Status: None   Collection Time: 04/17/16  7:10 PM  Result Value Ref Range Status   MRSA by PCR NEGATIVE NEGATIVE Final    Comment:  The GeneXpert MRSA Assay (FDA approved for NASAL specimens only), is one component of a comprehensive MRSA colonization surveillance program. It is not intended to diagnose MRSA infection nor to guide or monitor treatment for MRSA infections.     FURTHER DISCHARGE INSTRUCTIONS:  Get Medicines reviewed and adjusted: Please take all your medications with you for your next visit with your Primary MD  Laboratory/radiological data: Please request your Primary MD to go over all hospital tests and procedure/radiological results at the follow up, please ask your Primary MD to get all Hospital records sent to his/her office.  In some cases, they will be blood work, cultures and biopsy results pending at the time of your discharge. Please request that your primary care M.D. goes through all the records of your hospital data and follows up on these results.  Also Note the following: If you experience worsening of your admission symptoms, develop shortness of breath, life threatening emergency, suicidal or homicidal thoughts you must seek medical attention immediately by calling 911 or calling your MD immediately  if symptoms less severe.  You must read complete instructions/literature along with all the possible adverse reactions/side  effects for all the Medicines you take and that have been prescribed to you. Take any new Medicines after you have completely understood and accpet all the possible adverse reactions/side effects.   Do not drive when taking Pain medications or sleeping medications (Benzodaizepines)  Do not take more than prescribed Pain, Sleep and Anxiety Medications. It is not advisable to combine anxiety,sleep and pain medications without talking with your primary care practitioner  Special Instructions: If you have smoked or chewed Tobacco  in the last 2 yrs please stop smoking, stop any regular Alcohol  and or any Recreational drug use.  Wear Seat belts while driving.  Please note: You were cared for by a hospitalist during your hospital stay. Once you are discharged, your primary care physician will handle any further medical issues. Please note that NO REFILLS for any discharge medications will be authorized once you are discharged, as it is imperative that you return to your primary care physician (or establish a relationship with a primary care physician if you do not have one) for your post hospital discharge needs so that they can reassess your need for medications and monitor your lab values.  Total Time spent coordinating discharge including counseling, education and face to face time equals 45 minutes.  SignedOren Binet 04/19/2016 9:02 AM

## 2016-04-22 ENCOUNTER — Telehealth: Payer: Self-pay | Admitting: Internal Medicine

## 2016-04-22 ENCOUNTER — Encounter: Payer: Self-pay | Admitting: Internal Medicine

## 2016-04-22 ENCOUNTER — Non-Acute Institutional Stay (SKILLED_NURSING_FACILITY): Payer: Medicare Other | Admitting: Internal Medicine

## 2016-04-22 DIAGNOSIS — B965 Pseudomonas (aeruginosa) (mallei) (pseudomallei) as the cause of diseases classified elsewhere: Secondary | ICD-10-CM

## 2016-04-22 DIAGNOSIS — I6930 Unspecified sequelae of cerebral infarction: Secondary | ICD-10-CM

## 2016-04-22 DIAGNOSIS — N319 Neuromuscular dysfunction of bladder, unspecified: Secondary | ICD-10-CM

## 2016-04-22 DIAGNOSIS — M24542 Contracture, left hand: Secondary | ICD-10-CM

## 2016-04-22 DIAGNOSIS — J9610 Chronic respiratory failure, unspecified whether with hypoxia or hypercapnia: Secondary | ICD-10-CM | POA: Diagnosis not present

## 2016-04-22 DIAGNOSIS — E1122 Type 2 diabetes mellitus with diabetic chronic kidney disease: Secondary | ICD-10-CM

## 2016-04-22 DIAGNOSIS — N390 Urinary tract infection, site not specified: Secondary | ICD-10-CM

## 2016-04-22 DIAGNOSIS — R531 Weakness: Secondary | ICD-10-CM

## 2016-04-22 DIAGNOSIS — I10 Essential (primary) hypertension: Secondary | ICD-10-CM | POA: Diagnosis not present

## 2016-04-22 DIAGNOSIS — R1310 Dysphagia, unspecified: Secondary | ICD-10-CM | POA: Diagnosis not present

## 2016-04-22 DIAGNOSIS — F0150 Vascular dementia without behavioral disturbance: Secondary | ICD-10-CM

## 2016-04-22 DIAGNOSIS — M24549 Contracture, unspecified hand: Secondary | ICD-10-CM | POA: Insufficient documentation

## 2016-04-22 DIAGNOSIS — D638 Anemia in other chronic diseases classified elsewhere: Secondary | ICD-10-CM | POA: Diagnosis not present

## 2016-04-22 DIAGNOSIS — J449 Chronic obstructive pulmonary disease, unspecified: Secondary | ICD-10-CM | POA: Diagnosis not present

## 2016-04-22 DIAGNOSIS — G309 Alzheimer's disease, unspecified: Secondary | ICD-10-CM | POA: Diagnosis not present

## 2016-04-22 DIAGNOSIS — F0280 Dementia in other diseases classified elsewhere without behavioral disturbance: Secondary | ICD-10-CM

## 2016-04-22 DIAGNOSIS — F209 Schizophrenia, unspecified: Secondary | ICD-10-CM

## 2016-04-22 DIAGNOSIS — N182 Chronic kidney disease, stage 2 (mild): Secondary | ICD-10-CM

## 2016-04-22 DIAGNOSIS — F329 Major depressive disorder, single episode, unspecified: Secondary | ICD-10-CM

## 2016-04-22 NOTE — Progress Notes (Signed)
LOCATION: [name]  PCP: Blanchie Serve, MD   Code Status: Full Code  Goals of care: Advanced Directive information Advanced Directives 04/17/2016  Does patient have an advance directive? No  Would patient like information on creating an advanced directive? No - patient declined information  Pre-existing out of facility DNR order (yellow form or pink MOST form) -       Extended Emergency Contact Information Primary Emergency Contact: Moskal-Dean,Sharone Address: [place], [place] [address] Johnnette Litter of [place] Phone: [phone] Mobile Phone: [phone] Relation: Daughter Secondary Emergency Contact: Dean,Eric Dr. Address: [id number]          [place], [place] [address] Johnnette Litter of [place] Phone: [phone] Mobile Phone: [phone] Relation: Relative   Allergies  Allergen Reactions  . Codeine     Documented on MAR  . Lisinopril     Per MAR leg weakness  . Other     PAIN MEDICATIONS "KNOCK PT OUT" FOR SEVERAL DAYS.  PT ACCEPTS NO BLOOD PRODUCTS.     Chief Complaint  Patient presents with  . Readmit To SNF    Readmission     HPI:  Patient is a 82 y.o. female seen today for long term care post hospital re-admission from [id number] with complicated pseudomonas UTI and acute encephalopathy. She responded well to iv antibiotics. She is seen in her room today.   Review of Systems:  Constitutional: Negative for fever and diaphoresis. Energy level is slowly coming back. HENT: Negative for headache, congestion, nasal discharge. Eyes: Negative for double vision and discharge.  Respiratory: Negative for cough, shortness of breath and wheezing.   Cardiovascular: Negative for chest pain, palpitations, leg swelling.  Gastrointestinal: Negative for heartburn, nausea, vomiting, abdominal pain. Last bowel movement was 2 days ago.  Genitourinary: Negative for dysuria and flank pain.  Musculoskeletal: Negative for fall in  the facility.  Skin: Negative for itching, rash.  Neurological: Negative for dizziness. Psychiatric/Behavioral: Negative for depression.   Past Medical History:  Diagnosis Date  . Acute sinusitis   . Anemia   . Arthritis   . Breast cancer ([place])   . Cervical spondylosis   . CHF (congestive heart failure) (H[facility])    chronic and stable per MD note  . Chronic kidney disease    chronic kidney disease per MD notes  . Chronic schizophrenia ([place])   . COPD (chronic obstructive pulmonary disease) ([place])   . Dementia   . Depressive disorder   . DM (diabetes mellitus) ([facility])    DIET CONTROLLED  . Foley catheter in place   . GERD (gastroesophageal reflux disease)   . Glaucoma   . Hyperlipidemia   . Hypertension   . On home oxygen therapy    2 L PER NASAL CANNULA  . Osteoporosis   . Schizophrenia ([place])   . Stenosis of lumbosacral spine   . Stroke ([facility])    L hemiparesis  . Wheelchair dependent    Past Surgical History:  Procedure Laterality Date  . APPENDECTOMY    . BREAST SURGERY     breast cancer  . CATARACT EXTRACTION, BILATERAL    . CHOLECYSTECTOMY    . INSERTION OF SUPRAPUBIC CATHETER N/A 04/29/2014   Procedure: INSERTION OF SUPRAPUBIC CATHETER;  Surgeon: Arvil Persons, MD;  Location: [facility];  Service: Urology;  Laterality: N/A;  . JOINT REPLACEMENT     L TOTAL KNEE  . UTERINE SUSPENSION  Social History:   reports that she quit smoking about 18 years ago. Her smoking use included Cigarettes. She has a 15.00 pack-year smoking history. She has never used smokeless tobacco. She reports that she does not drink alcohol or use drugs.  Family History  Problem Relation Age of Onset  . Asthma Daughter   . Heart disease Sister     3 sisters  . Heart disease Brother     x 2  . Cancer Brother     multiple myloma    Medications:   Medication List       Accurate as of 04/22/16 11:39 AM. Always use your most recent med list.          acetaminophen 500 MG tablet Commonly  known as:  TYLENOL Take 500 mg by mouth every 8 (eight) hours as needed for mild pain or fever.   albuterol (2.5 MG/3ML) 0.083% nebulizer solution Commonly known as:  PROVENTIL Take 2.5 mg by nebulization 3 (three) times daily.   amLODipine 5 MG tablet Commonly known as:  NORVASC Take 5 mg by mouth daily with breakfast.   aspirin 81 MG chewable tablet Chew 1 tablet (81 mg total) by mouth daily.   budesonide 0.5 MG/2ML nebulizer solution Commonly known as:  PULMICORT Take 0.5 mg by nebulization 2 (two) times daily.   ciprofloxacin 500 MG tablet Commonly known as:  CIPRO Take 1 tablet (500 mg total) by mouth 2 (two) times daily. 3 more days from 04/20/16   cycloSPORINE 0.05 % ophthalmic emulsion Commonly known as:  RESTASIS Place 1 drop into both eyes 2 (two) times daily.   donepezil 10 MG tablet Commonly known as:  ARICEPT Take 10 mg by mouth at bedtime.   estradiol 0.1 MG/GM vaginal cream Commonly known as:  ESTRACE Place 1 Applicatorful vaginally once a week. Monday and Friday   fluPHENAZine decanoate 25 MG/ML injection Commonly known as:  PROLIXIN Inject 25 mg into the muscle every 30 (thirty) days.   fluticasone 50 MCG/ACT nasal spray Commonly known as:  FLONASE Place 2 sprays into the nose every evening. Reported on 11/10/2015   hydrALAZINE 25 MG tablet Commonly known as:  APRESOLINE Take 25 mg by mouth 2 (two) times daily.   MYRBETRIQ 25 MG Tb24 tablet Generic drug:  mirabegron ER Take 25 mg by mouth at bedtime.   olopatadine 0.1 % ophthalmic solution Commonly known as:  PATANOL Place 1 drop into both eyes 2 (two) times daily.   OXYGEN Inhale 2 L into the lungs continuous.   polyethylene glycol packet Commonly known as:  MIRALAX / GLYCOLAX Take 17 g by mouth daily.   saccharomyces boulardii 250 MG capsule Commonly known as:  FLORASTOR Take 250 mg by mouth daily.   senna 8.6 MG Tabs tablet Commonly known as:  SENOKOT Take 1 tablet (8.6 mg total) by  mouth daily as needed for mild constipation.   sertraline 50 MG tablet Commonly known as:  ZOLOFT Take 50 mg by mouth daily with breakfast.   SYSTANE BALANCE OP Place 1 drop into both eyes 2 (two) times daily.   Vitamin B-12 1000 MCG Subl Place 0.5 tablets under the tongue daily with breakfast.   Vitamin D3 2000 units Tabs Take 2,000 Units by mouth every evening. For vitamin D deficiency       Immunizations: Immunization History  Administered Date(s) Administered  . Influenza Split 06/16/2013  . Influenza-Unspecified 06/23/2014, 07/04/2015  . PPD Test 12/23/2013, 01/09/2014, 05/25/2015  . Pneumococcal Polysaccharide-23 09/16/2002  .  Pneumococcal-Unspecified 09/15/2001     Physical Exam:  Vitals:   04/22/16 1125  BP: 120/60  Pulse: 68  Resp: 18  Temp: 98.2 F (36.8 C)  TempSrc: Oral  SpO2: 95%  Weight: 210 lb (95.3 kg)  Height: 5\' 8"  (1.727 m)   Body mass index is 31.93 kg/m.   General- elderly obese female in no acute distress Head- atraumatic, normocephalic Eyes- no pallor, no icterus, no discharge Cardiovascular- normal s1,s2, no murmur, trace leg edema Respiratory- bilateral clear to auscultation, no wheeze, no rhonchi, no crackles Abdomen- bowel sounds present, soft, non tender, suprapubic catheter in place and site clean Musculoskeletal- left hemiplegia with left hand contracted, left 4th and 5th finger contracted, has a splint in place, arthritis changes in digits, hoyer transfer, diminished pedal pulses Neurological- alert and oriented to person only Psychiatry- normal mood and affect    Labs reviewed: Basic Metabolic Panel:  Recent Labs  01/21/16 0428  02/27/16 1229  03/28/16 04/17/16 1200 04/18/16 0449  NA 143  < > 140  < > 140 139 138  K 3.2*  < > 4.2  < > 4.1 4.3 3.9  CL 107  < > 110  --   --  107 108  CO2 27  < > 22  --   --  28 24  GLUCOSE 115*  < > 107*  --   --  88 99  BUN 8  < > 8  < > 12 14 10   CREATININE 0.59  < > 0.42*  < >  0.7 0.70 0.59  CALCIUM 9.8  < > 9.5  --   --  9.8 9.2  MG 1.7  --   --   --   --   --   --   PHOS 3.0  --   --   --   --   --   --   < > = values in this interval not displayed. Liver Function Tests:  Recent Labs  01/21/16 0428 02/24/16 2055 03/28/16 04/17/16 1200  AST 14* 13* 10* 16  ALT 12* 10* 7 10*  ALKPHOS 82 68 85 84  BILITOT 0.5 0.5  --  0.3  PROT 6.8 6.2*  --  7.1  ALBUMIN 3.6 3.1*  --  3.6   No results for input(s): LIPASE, AMYLASE in the last 8760 hours. No results for input(s): AMMONIA in the last 8760 hours. CBC:  Recent Labs  02/17/16 2239 02/24/16 2055  02/27/16 1229  03/28/16 04/17/16 1200 04/18/16 0449  WBC 8.6 5.9  < > 6.1  < > 5.9 8.0 7.2  NEUTROABS 5.7 3.6  --   --   --   --  5.6  --   HGB 11.6* 9.8*  < > 10.6*  < > 10.4* 11.1* 10.9*  HCT 37.7 32.9*  < > 35.1*  < > 35* 36.0 35.5*  MCV 86.3 86.4  < > 85.8  --   --  86.3 86.4  PLT 188 158  < > PLATELET CLUMPS NOTED ON SMEAR, UNABLE TO ESTIMATE  < > 202 189 190  < > = values in this interval not displayed.   Radiological Exams: Ct Head Wo Contrast  Result Date: 04/17/2016 CLINICAL DATA:  Altered mental status and unresponsive pupils EXAM: CT HEAD WITHOUT CONTRAST TECHNIQUE: Contiguous axial images were obtained from the base of the skull through the vertex without intravenous contrast. COMPARISON:  February 24, 2016 FINDINGS: Brain: Moderate diffuse atrophy is stable. There is no  intracranial mass, hemorrhage, extra-axial fluid collection, or midline shift. There is extensive small vessel disease throughout the centra semiovale bilaterally. There are prior infarcts in the right internal capsule at multiple sites with extension of infarct into the inferior posterior right basal ganglia. No new gray-white compartment lesions are identified. No acute infarct evident. Vascular: No hyperdense vessels. There is calcification in each carotid siphon as well as in each proximal middle cerebral artery. Skull: Bony calvarium  appears intact. Sinuses/Orbits: Visualized paranasal sinuses are clear. Orbits appear symmetric bilaterally. Other: Mastoid air cells are clear. IMPRESSION: Atrophy with extensive supratentorial small vessel disease. Prior right basal ganglia region infarcts. No acute infarct evident. No hemorrhage or mass effect. Areas of vascular calcification. Electronically Signed   By: Lowella Grip III M.D.   On: 04/17/2016 13:19   Dg Chest Portable 1 View  Result Date: 04/17/2016 CLINICAL DATA:  Altered mental status, wheezing. Patient unresponsive. EXAM: PORTABLE CHEST 1 VIEW COMPARISON:  02/24/2016 FINDINGS: Cardiomegaly with vascular congestion. Probable aneurysmal dilatation of the aortic arch with scattered calcifications. No confluent airspace opacities or effusions. No overt edema. No acute bony abnormality. IMPRESSION: Cardiomegaly with vascular congestion. Aneurysmal dilatation of the aortic arch. Electronically Signed   By: Rolm Baptise M.D.   On: 04/17/2016 12:19     Assessment/Plan  Generalized weakness From deconditioning in setting of infection.  To work with restorative therapy team as tolerated to regain strength and restore function.  Fall precautions are in place.  pseudomonas UTI Continue and complete po ciprofloxacin 500 mg q12h on 04/23/16. Continue florastor. to continue suprapubic catheter care.   Dysphagia Aspiration precautions, assistance with feed as needed. Continue dysphagia 3 diet with thin liquids and aspiration precautions  Left upper extremity contracture Continue supportive care with supportive brace and prn tylenol for now and monitor  Neurogenic bladder Continue monthly change of suprapubic catheter and daily bladder irrigation. Continue myrbetriq, to follow with urology. Suprapubic catheter was changed in the hospital on 04/17/16  Chronic respiratory failure On o2, stable. Continue albuterol nebulizer tid with pulmicort  COPD Stable breathing, continue her  bronchodilators  Hypertension Controlled BP reading. Continue hydralazine 25 mg bid with amlodipine 5 mg daily. Monitor bp and BMP  CVA with residual effect Continue aspirin 81 mg daily for now. Continue splints for her contractures. Continue supportive care.   Mixed Dementia Has history of CVA and neurodegenerative disorder. Continue supportive care. Continue aricept for now  Anemia of chronic disease Monitor cbc  Schizophrenia Stable, followed by psych services, continue fluphenazine monthly injection  Chronic depression Continue zoloft 75 mg daily and monitor  Type 2 DM with ckd stage 2 Lab Results  Component Value Date   HGBA1C 6.0 (H) 01/21/2016  Off oral hypoglycemic agent, diet controlled for now, recheck a1c and bmp   Labs- cbc, cmp, a1c   Goals of care: long term care   Family/ staff Communication: reviewed care plan with patient and nursing supervisor    Blanchie Serve, MD Internal Medicine [place], [place] [address] Cell Phone (Monday-Friday 8 am - 5 pm): [phone] On Call: [phone] and follow prompts after 5 pm and on weekends Office Phone: [phone] Office Fax: [phone]

## 2016-04-29 LAB — BASIC METABOLIC PANEL
BUN: 15 mg/dL (ref 4–21)
CREATININE: 0.7 mg/dL (ref 0.5–1.1)
Glucose: 108 mg/dL
POTASSIUM: 3.8 mmol/L (ref 3.4–5.3)
SODIUM: 143 mmol/L (ref 137–147)

## 2016-04-29 LAB — CBC AND DIFFERENTIAL
HCT: 34 % — AB (ref 36–46)
Hemoglobin: 10.1 g/dL — AB (ref 12.0–16.0)
Platelets: 191 10*3/uL (ref 150–399)
WBC: 6.2 10^3/mL

## 2016-04-29 LAB — HEMOGLOBIN A1C: HEMOGLOBIN A1C: 5.7

## 2016-05-29 ENCOUNTER — Encounter: Payer: Self-pay | Admitting: Internal Medicine

## 2016-05-29 ENCOUNTER — Non-Acute Institutional Stay (SKILLED_NURSING_FACILITY): Payer: Medicare Other | Admitting: Internal Medicine

## 2016-05-29 DIAGNOSIS — I10 Essential (primary) hypertension: Secondary | ICD-10-CM

## 2016-05-29 DIAGNOSIS — N319 Neuromuscular dysfunction of bladder, unspecified: Secondary | ICD-10-CM

## 2016-05-29 DIAGNOSIS — F329 Major depressive disorder, single episode, unspecified: Secondary | ICD-10-CM

## 2016-05-29 NOTE — Progress Notes (Signed)
LOC[facility]  PCP: Blanchie Serve, MD   Code Status: Full Code  Goals of care: Advanced Directive information Advanced Directives 04/17/2016  Does patient have an advance directive? No  Would patient like information on creating an advanced directive? No - patient declined information  Pre-existing out of facility DNR order (yellow form or pink MOST form) -       Extended Emergency Contact Information Primary Emergency Contact: Newlon-Dean,Sharone Address: [place], [place] [address] Johnnette Litter of [place] Phone: [phone] Mobile Phone: [phone] Relation: Daughter Secondary Emergency Contact: Dean,Eric Dr. Address: [id number]          [place], [place] [address] Johnnette Litter of [place] Phone: [phone] Mobile Phone: [phone] Relation: Relative   Allergies  Allergen Reactions  . Codeine     Documented on MAR  . Lisinopril     Per MAR leg weakness  . Other     PAIN MEDICATIONS "KNOCK PT OUT" FOR SEVERAL DAYS.  PT ACCEPTS NO BLOOD PRODUCTS.     Chief Complaint  Patient presents with  . Medical Management of Chronic Issues    Routine Visit     HPI:  Patient is a 82 y.o. female seen today for routine visit. She has been at her baseline. She denies any concern this visit.   Review of Systems:  Constitutional: Negative for fever HENT: Negative for headache, congestion Eyes: Negative for double vision and discharge.  Respiratory: Negative for cough, shortness of breath and wheezing.   Cardiovascular: Negative for chest pain, palpitations, leg swelling.  Gastrointestinal: Negative for heartburn, nausea, vomiting, abdominal pain.  Genitourinary: Negative for flank pain.  Musculoskeletal: Negative for fall in the facility.  Skin: Negative for itching, rash.  Neurological: Negative for dizziness. Psychiatric/Behavioral: Negative for depression.   Past Medical History:  Diagnosis Date  . Acute sinusitis     . Anemia   . Arthritis   . Breast cancer ([facility] [place])   . Cervical spondylosis   . CHF (congestive heart failure) ([facility])    chronic and stable per MD note  . Chronic kidney disease    chronic kidney disease per MD notes  . Chronic schizophrenia ([place])   . COPD (chronic obstructive pulmonary disease) ([facility])   . Dementia   . Depressive disorder   . DM (diabetes mellitus) ([facility])    DIET CONTROLLED  . Foley catheter in place   . GERD (gastroesophageal reflux disease)   . Glaucoma   . Hyperlipidemia   . Hypertension   . On home oxygen therapy    2 L PER NASAL CANNULA  . Osteoporosis   . Schizophrenia ([place])   . Stenosis of lumbosacral spine   . Stroke ([facility])    L hemiparesis  . Wheelchair dependent    Past Surgical History:  Procedure Laterality Date  . APPENDECTOMY    . BREAST SURGERY     breast cancer  . CATARACT EXTRACTION, BILATERAL    . CHOLECYSTECTOMY    . INSERTION OF SUPRAPUBIC CATHETER N/A 04/29/2014   Procedure: INSERTION OF SUPRAPUBIC CATHETER;  Surgeon: Arvil Persons, MD;  Location: [facility];  Service: Urology;  Laterality: N/A;  . JOINT REPLACEMENT     L TOTAL KNEE  . UTERINE SUSPENSION     Social History:   reports that she quit smoking about 18 years ago. Her smoking use included Cigarettes. She has a 15.00 pack-year smoking history. She has  never used smokeless tobacco. She reports that she does not drink alcohol or use drugs.  Family History  Problem Relation Age of Onset  . Asthma Daughter   . Heart disease Sister     3 sisters  . Heart disease Brother     x 2  . Cancer Brother     multiple myloma    Medications:   Medication List       Accurate as of 05/29/16  3:32 PM. Always use your most recent med list.          acetaminophen 500 MG tablet Commonly known as:  TYLENOL Take 500 mg by mouth every 8 (eight) hours as needed for mild pain or fever.   albuterol (2.5 MG/3ML) 0.083% nebulizer solution Commonly known as:  PROVENTIL Take 2.5 mg by  nebulization 3 (three) times daily.   amLODipine 5 MG tablet Commonly known as:  NORVASC Take 5 mg by mouth daily with breakfast.   aspirin 81 MG chewable tablet Chew 1 tablet (81 mg total) by mouth daily.   budesonide 0.5 MG/2ML nebulizer solution Commonly known as:  PULMICORT Take 0.5 mg by nebulization 2 (two) times daily.   cycloSPORINE 0.05 % ophthalmic emulsion Commonly known as:  RESTASIS Place 1 drop into both eyes 2 (two) times daily.   donepezil 10 MG tablet Commonly known as:  ARICEPT Take 10 mg by mouth at bedtime.   estradiol 0.1 MG/GM vaginal cream Commonly known as:  ESTRACE Place 1 Applicatorful vaginally once a week. Monday and Friday   fluPHENAZine decanoate 25 MG/ML injection Commonly known as:  PROLIXIN Inject 25 mg into the muscle every 30 (thirty) days.   fluticasone 50 MCG/ACT nasal spray Commonly known as:  FLONASE Place 2 sprays into the nose every evening. Reported on 11/10/2015   hydrALAZINE 25 MG tablet Commonly known as:  APRESOLINE Take 25 mg by mouth 2 (two) times daily.   MYRBETRIQ 25 MG Tb24 tablet Generic drug:  mirabegron ER Take 25 mg by mouth at bedtime.   olopatadine 0.1 % ophthalmic solution Commonly known as:  PATANOL Place 1 drop into both eyes 2 (two) times daily.   OXYGEN Inhale 2 L into the lungs continuous.   polyethylene glycol packet Commonly known as:  MIRALAX / GLYCOLAX Take 17 g by mouth daily.   saccharomyces boulardii 250 MG capsule Commonly known as:  FLORASTOR Take 250 mg by mouth daily.   senna 8.6 MG Tabs tablet Commonly known as:  SENOKOT Take 1 tablet (8.6 mg total) by mouth daily as needed for mild constipation.   sertraline 50 MG tablet Commonly known as:  ZOLOFT Take 50 mg by mouth daily with breakfast.   SYSTANE BALANCE OP Place 1 drop into both eyes 2 (two) times daily.   Vitamin B-12 1000 MCG Subl Place 0.5 tablets under the tongue daily with breakfast.   Vitamin D3 2000 units  Tabs Take 2,000 Units by mouth every evening. For vitamin D deficiency       Immunizations: Immunization History  Administered Date(s) Administered  . Influenza Split 06/16/2013  . Influenza-Unspecified 06/23/2014, 07/04/2015  . PPD Test 12/23/2013, 01/09/2014, 05/25/2015  . Pneumococcal Polysaccharide-23 09/16/2002  . Pneumococcal-Unspecified 09/15/2001     Physical Exam:  Vitals:   05/29/16 1524  BP: (!) 151/85  Pulse: 80  Resp: 20  Temp: 98.5 F (36.9 C)  TempSrc: Oral  SpO2: 97%  Weight: 280 lb 12.8 oz (127.4 kg)  Height: 5\' 8"  (1.727 m)  Body mass index is 42.7 kg/m.   General- elderly morbidly obese female in no acute distress Head- atraumatic, normocephalic Eyes- no pallor, no icterus, no discharge Cardiovascular- normal s1,s2, no murmur, trace leg edema Respiratory- bilateral clear to auscultation, no wheeze, no rhonchi, no crackles Abdomen- bowel sounds present, soft, non tender, suprapubic catheter in place and site clean Musculoskeletal- left hemiplegia with left hand contracted, left 4th and 5th finger contracted, has a splint in place, arthritis changes in digits, hoyer transfer, diminished pedal pulses Neurological- alert and oriented to person only Psychiatry- normal mood and affect    Labs reviewed: Basic Metabolic Panel:  Recent Labs  01/21/16 0428  02/27/16 1229  04/17/16 1200 04/18/16 0449 04/29/16  NA 143  < > 140  < > 139 138 143  K 3.2*  < > 4.2  < > 4.3 3.9 3.8  CL 107  < > 110  --  107 108  --   CO2 27  < > 22  --  28 24  --   GLUCOSE 115*  < > 107*  --  88 99  --   BUN 8  < > 8  < > 14 10 15   CREATININE 0.59  < > 0.42*  < > 0.70 0.59 0.7  CALCIUM 9.8  < > 9.5  --  9.8 9.2  --   MG 1.7  --   --   --   --   --   --   PHOS 3.0  --   --   --   --   --   --   < > = values in this interval not displayed. Liver Function Tests:  Recent Labs  01/21/16 0428 02/24/16 2055 03/28/16 04/17/16 1200  AST 14* 13* 10* 16  ALT 12* 10*  7 10*  ALKPHOS 82 68 85 84  BILITOT 0.5 0.5  --  0.3  PROT 6.8 6.2*  --  7.1  ALBUMIN 3.6 3.1*  --  3.6   No results for input(s): LIPASE, AMYLASE in the last 8760 hours. No results for input(s): AMMONIA in the last 8760 hours. CBC:  Recent Labs  02/17/16 2239 02/24/16 2055  02/27/16 1229  04/17/16 1200 04/18/16 0449 04/29/16  WBC 8.6 5.9  < > 6.1  < > 8.0 7.2 6.2  NEUTROABS 5.7 3.6  --   --   --  5.6  --   --   HGB 11.6* 9.8*  < > 10.6*  < > 11.1* 10.9* 10.1*  HCT 37.7 32.9*  < > 35.1*  < > 36.0 35.5* 34*  MCV 86.3 86.4  < > 85.8  --  86.3 86.4  --   PLT 188 158  < > PLATELET CLUMPS NOTED ON SMEAR, UNABLE TO ESTIMATE  < > 189 190 191  < > = values in this interval not displayed.    Assessment/Plan   Neurogenic bladder Continue monthly change of suprapubic catheter and daily bladder irrigation. Continue myrbetriq, to follow with urology.   Hypertension elevated BP reading. Currently on hydralazine 25 mg bid with amlodipine 5 mg daily. Monitor bp bid x 2 weeks and if > 3 reading > 150/90, consider increasing hydralazine dosing.   Chronic depression Continue sertraline 50 mg daily and monitor      Blanchie Serve, MD Internal Medicine [facility] [address] [place], [place] [address] Cell Phone (Monday-Friday 8 am - 5 pm): [phone] On Call: [phone] and follow  prompts after 5 pm and on weekends Office Phone: [phone] Office Fax: [phone]

## 2016-06-25 LAB — CBC AND DIFFERENTIAL
HEMATOCRIT: 34 % — AB (ref 36–46)
HEMOGLOBIN: 10.2 g/dL — AB (ref 12.0–16.0)
PLATELETS: 184 10*3/uL (ref 150–399)
WBC: 5.8 10*3/mL

## 2016-06-25 LAB — BASIC METABOLIC PANEL
BUN: 13 mg/dL (ref 4–21)
Creatinine: 0.7 mg/dL (ref 0.5–1.1)
Glucose: 103 mg/dL
Potassium: 4 mmol/L (ref 3.4–5.3)
Sodium: 140 mmol/L (ref 137–147)

## 2016-06-25 LAB — HEPATIC FUNCTION PANEL
ALK PHOS: 112 U/L (ref 25–125)
ALT: 7 U/L (ref 7–35)
AST: 9 U/L — AB (ref 13–35)
Bilirubin, Total: 0.3 mg/dL

## 2016-06-25 LAB — HEMOGLOBIN A1C: HEMOGLOBIN A1C: 5.6

## 2016-06-26 ENCOUNTER — Non-Acute Institutional Stay (SKILLED_NURSING_FACILITY): Payer: Medicare Other | Admitting: Internal Medicine

## 2016-06-26 ENCOUNTER — Encounter: Payer: Self-pay | Admitting: Internal Medicine

## 2016-06-26 DIAGNOSIS — J3089 Other allergic rhinitis: Secondary | ICD-10-CM

## 2016-06-26 DIAGNOSIS — J9610 Chronic respiratory failure, unspecified whether with hypoxia or hypercapnia: Secondary | ICD-10-CM | POA: Diagnosis not present

## 2016-06-26 DIAGNOSIS — J449 Chronic obstructive pulmonary disease, unspecified: Secondary | ICD-10-CM

## 2016-06-26 NOTE — Progress Notes (Signed)
LOCATION: [facility]  PCP: Blanchie Serve, MD   Code Status: Full Code  Goals of care: Advanced Directive information Advanced Directives 04/17/2016  Does patient have an advance directive? No  Would patient like information on creating an advanced directive? No - patient declined information  Pre-existing out of facility DNR order (yellow form or pink MOST form) -       Extended Emergency Contact Information Primary Emergency Contact: Cabriales-Dean,Sharone Address: [place], [place] [address] Johnnette Litter of [place] Phone: [phone] Mobile Phone: [phone] Relation: Daughter Secondary Emergency Contact: Dean,Eric Dr. Address: [id number]          [place], [place] [address] Johnnette Litter of [place] Phone: [phone] Mobile Phone: [phone] Relation: Relative   Allergies  Allergen Reactions  . Codeine     Documented on MAR  . Lisinopril     Per MAR leg weakness  . Other     PAIN MEDICATIONS "KNOCK PT OUT" FOR SEVERAL DAYS.  PT ACCEPTS NO BLOOD PRODUCTS.     Chief Complaint  Patient presents with  . Medical Management of Chronic Issues    Routine Visit     HPI:  Patient is a 82 y.o. female seen today for routine visit. She has been at her baseline. No nursing concern this visit.   Review of Systems:  Constitutional: Negative for fever Respiratory: Negative for cough, shortness of breath. On o2 Cardiovascular: Negative for chest pain, palpitations, leg swelling.  Gastrointestinal: Negative for heartburn, nausea, vomiting, abdominal pain.   Skin: Negative for itching, rash.  Neurological: Negative for dizziness.   Past Medical History:  Diagnosis Date  . Acute sinusitis   . Anemia   . Arthritis   . Breast cancer ([facility])   . Cervical spondylosis   . CHF (congestive heart failure) (HCC)    chronic and stable per MD note  . Chronic kidney disease    chronic kidney disease per MD notes  . Chronic schizophrenia  ([place])   . COPD (chronic obstructive pulmonary disease) ([facility])   . Dementia   . Depressive disorder   . DM (diabetes mellitus) ([facility])    DIET CONTROLLED  . Foley catheter in place   . GERD (gastroesophageal reflux disease)   . Glaucoma   . Hyperlipidemia   . Hypertension   . On home oxygen therapy    2 L PER NASAL CANNULA  . Osteoporosis   . Schizophrenia ([facility])   . Stenosis of lumbosacral spine   . Stroke (HCC)    L hemiparesis  . Wheelchair dependent    Past Surgical History:  Procedure Laterality Date  . APPENDECTOMY    . BREAST SURGERY     breast cancer  . CATARACT EXTRACTION, BILATERAL    . CHOLECYSTECTOMY    . INSERTION OF SUPRAPUBIC CATHETER N/A 04/29/2014   Procedure: INSERTION OF SUPRAPUBIC CATHETER;  Surgeon: Arvil Persons, MD;  Location: [facility];  Service: Urology;  Laterality: N/A;  . JOINT REPLACEMENT     L TOTAL KNEE  . UTERINE SUSPENSION     Social History:   reports that she quit smoking about 18 years ago. Her smoking use included Cigarettes. She has a 15.00 pack-year smoking history. She has never used smokeless tobacco. She reports that she does not drink alcohol or use drugs.  Family History  Problem Relation Age of Onset  . Asthma Daughter   . Heart disease Sister  3 sisters  . Heart disease Brother     x 2  . Cancer Brother     multiple myloma    Medications:   Medication List       Accurate as of 06/26/16  3:09 PM. Always use your most recent med list.          acetaminophen 500 MG tablet Commonly known as:  TYLENOL Take 500 mg by mouth every 8 (eight) hours as needed for mild pain or fever.   albuterol (2.5 MG/3ML) 0.083% nebulizer solution Commonly known as:  PROVENTIL Take 2.5 mg by nebulization 3 (three) times daily.   amLODipine 5 MG tablet Commonly known as:  NORVASC Take 5 mg by mouth daily with breakfast.   aspirin 81 MG chewable tablet Chew 1 tablet (81 mg total) by mouth daily.   budesonide 0.5 MG/2ML nebulizer  solution Commonly known as:  PULMICORT Take 0.5 mg by nebulization 2 (two) times daily.   cycloSPORINE 0.05 % ophthalmic emulsion Commonly known as:  RESTASIS Place 1 drop into both eyes 2 (two) times daily.   donepezil 10 MG tablet Commonly known as:  ARICEPT Take 10 mg by mouth at bedtime.   estradiol 0.1 MG/GM vaginal cream Commonly known as:  ESTRACE Place 1 Applicatorful vaginally once a week. Monday and Friday   fluPHENAZine decanoate 25 MG/ML injection Commonly known as:  PROLIXIN Inject 12.5 mg into the muscle every 30 (thirty) days.   fluticasone 50 MCG/ACT nasal spray Commonly known as:  FLONASE Place 2 sprays into the nose every evening. Reported on 11/10/2015   hydrALAZINE 25 MG tablet Commonly known as:  APRESOLINE Take 25 mg by mouth 2 (two) times daily.   MYRBETRIQ 25 MG Tb24 tablet Generic drug:  mirabegron ER Take 25 mg by mouth at bedtime.   olopatadine 0.1 % ophthalmic solution Commonly known as:  PATANOL Place 1 drop into both eyes 2 (two) times daily.   OXYGEN Inhale 2 L into the lungs continuous.   polyethylene glycol packet Commonly known as:  MIRALAX / GLYCOLAX Take 17 g by mouth daily.   saccharomyces boulardii 250 MG capsule Commonly known as:  FLORASTOR Take 250 mg by mouth daily.   senna 8.6 MG Tabs tablet Commonly known as:  SENOKOT Take 1 tablet (8.6 mg total) by mouth daily as needed for mild constipation.   sertraline 50 MG tablet Commonly known as:  ZOLOFT Take 50 mg by mouth daily with breakfast.   SYSTANE BALANCE OP Place 1 drop into both eyes 2 (two) times daily.   Vitamin B-12 1000 MCG Subl Place 0.5 tablets under the tongue daily with breakfast.   Vitamin D3 2000 units Tabs Take 2,000 Units by mouth every evening. For vitamin D deficiency       Immunizations: Immunization History  Administered Date(s) Administered  . Influenza Split 06/16/2013  . Influenza-Unspecified 06/23/2014, 07/04/2015  . PPD Test  12/23/2013, 01/09/2014, 05/25/2015  . Pneumococcal Polysaccharide-23 09/16/2002  . Pneumococcal-Unspecified 09/15/2001     Physical Exam:  Vitals:   06/26/16 1501  BP: 138/78  Pulse: 78  Resp: 20  Temp: (!) 95 F (35 C)  TempSrc: Oral  SpO2: 96%  Weight: 169 lb (76.7 kg)  Height: 5\' 8"  (1.727 m)   Body mass index is 25.7 kg/m.   General- elderly morbidly obese female in no acute distress Cardiovascular- normal s1,s2, no murmur, trace leg edema Respiratory- bilateral clear to auscultation, no wheeze, no rhonchi, no crackles Abdomen- bowel sounds present,  soft, non tender, suprapubic catheter in place and site clean Musculoskeletal- left hemiplegia with left hand contracted, left 4th and 5th finger contracted, has a splint in place, arthritis changes in digits, hoyer transfer, diminished pedal pulses Neurological- alert and oriented to person only     Labs reviewed: Basic Metabolic Panel:  Recent Labs  01/21/16 0428  02/27/16 1229  04/17/16 1200 04/18/16 0449 04/29/16  NA 143  < > 140  < > 139 138 143  K 3.2*  < > 4.2  < > 4.3 3.9 3.8  CL 107  < > 110  --  107 108  --   CO2 27  < > 22  --  28 24  --   GLUCOSE 115*  < > 107*  --  88 99  --   BUN 8  < > 8  < > 14 10 15   CREATININE 0.59  < > 0.42*  < > 0.70 0.59 0.7  CALCIUM 9.8  < > 9.5  --  9.8 9.2  --   MG 1.7  --   --   --   --   --   --   PHOS 3.0  --   --   --   --   --   --   < > = values in this interval not displayed. Liver Function Tests:  Recent Labs  01/21/16 0428 02/24/16 2055 03/28/16 04/17/16 1200  AST 14* 13* 10* 16  ALT 12* 10* 7 10*  ALKPHOS 82 68 85 84  BILITOT 0.5 0.5  --  0.3  PROT 6.8 6.2*  --  7.1  ALBUMIN 3.6 3.1*  --  3.6   No results for input(s): LIPASE, AMYLASE in the last 8760 hours. No results for input(s): AMMONIA in the last 8760 hours. CBC:  Recent Labs  02/17/16 2239 02/24/16 2055  02/27/16 1229  04/17/16 1200 04/18/16 0449 04/29/16  WBC 8.6 5.9  < > 6.1  < >  8.0 7.2 6.2  NEUTROABS 5.7 3.6  --   --   --  5.6  --   --   HGB 11.6* 9.8*  < > 10.6*  < > 11.1* 10.9* 10.1*  HCT 37.7 32.9*  < > 35.1*  < > 36.0 35.5* 34*  MCV 86.3 86.4  < > 85.8  --  86.3 86.4  --   PLT 188 158  < > PLATELET CLUMPS NOTED ON SMEAR, UNABLE TO ESTIMATE  < > 189 190 191  < > = values in this interval not displayed.    Assessment/Plan   COPD Stable breathing on o2. Continue her bronchodilator albuterol neb tid, pulmicort bid  Chronic respiratory failure Stable at present on chronic oxygen and breathing treatment  Allergic rhinitis Continue folanse, monitor    Blanchie Serve, MD Internal Medicine [facility] [address] [place], [place] [address] Cell Phone (Monday-Friday 8 am - 5 pm): [phone] On Call: [phone] and follow prompts after 5 pm and on weekends Office Phone: [phone] Office Fax: [phone]

## 2016-08-19 ENCOUNTER — Encounter: Payer: Self-pay | Admitting: Internal Medicine

## 2016-08-19 ENCOUNTER — Non-Acute Institutional Stay (SKILLED_NURSING_FACILITY): Payer: Medicare Other | Admitting: Internal Medicine

## 2016-08-19 DIAGNOSIS — N189 Chronic kidney disease, unspecified: Secondary | ICD-10-CM | POA: Diagnosis not present

## 2016-08-19 DIAGNOSIS — I509 Heart failure, unspecified: Secondary | ICD-10-CM

## 2016-08-19 DIAGNOSIS — N319 Neuromuscular dysfunction of bladder, unspecified: Secondary | ICD-10-CM

## 2016-08-19 DIAGNOSIS — I130 Hypertensive heart and chronic kidney disease with heart failure and stage 1 through stage 4 chronic kidney disease, or unspecified chronic kidney disease: Secondary | ICD-10-CM

## 2016-08-19 DIAGNOSIS — G309 Alzheimer's disease, unspecified: Secondary | ICD-10-CM | POA: Diagnosis not present

## 2016-08-19 DIAGNOSIS — F329 Major depressive disorder, single episode, unspecified: Secondary | ICD-10-CM

## 2016-08-19 DIAGNOSIS — F32A Depression, unspecified: Secondary | ICD-10-CM

## 2016-08-19 DIAGNOSIS — F209 Schizophrenia, unspecified: Secondary | ICD-10-CM

## 2016-08-19 DIAGNOSIS — F0280 Dementia in other diseases classified elsewhere without behavioral disturbance: Secondary | ICD-10-CM | POA: Diagnosis not present

## 2016-08-19 DIAGNOSIS — F0150 Vascular dementia without behavioral disturbance: Secondary | ICD-10-CM

## 2016-08-19 NOTE — Progress Notes (Signed)
LOCATION: [name]  PCP: Blanchie Serve, MD   Code Status: Full Code  Goals of care: Advanced Directive information Advanced Directives 04/17/2016  Does Patient Have a Medical Advance Directive? No  Would patient like information on creating a medical advance directive? No - patient declined information  Pre-existing out of facility DNR order (yellow form or pink MOST form) -       Extended Emergency Contact Information Primary Emergency Contact: Kotula-Dean,Sharone Address: [place], [place] [address] Johnnette Litter of [place] Phone: [phone] Mobile Phone: [phone] Relation: Daughter Secondary Emergency Contact: Dean,Eric Dr. Address: [id number]          [place], [place] [address] Johnnette Litter of [place] Phone: [phone] Mobile Phone: [phone] Relation: Relative   Allergies  Allergen Reactions  . Codeine     Documented on MAR  . Lisinopril     Per MAR leg weakness  . Other     PAIN MEDICATIONS "KNOCK PT OUT" FOR SEVERAL DAYS.  PT ACCEPTS NO BLOOD PRODUCTS.     Chief Complaint  Patient presents with  . Medical Management of Chronic Issues    Routine Visit     HPI:  82 y.o. female seen today for routine visit. She has been at her baseline. Her breathing is stable on oxygen. No nursing concern this visit.   Review of Systems:  Constitutional: Negative for fever Respiratory: Negative for cough, shortness of breath. On o2 by nasal canula.  Cardiovascular: Negative for chest pain, palpitations, leg swelling.  Gastrointestinal: Negative for heartburn, nausea, vomiting, abdominal pain.   Skin: Negative for itching, rash.  Neurological: Negative for dizziness.   Past Medical History:  Diagnosis Date  . Acute sinusitis   . Anemia   . Arthritis   . Breast cancer ([facility])   . Cervical spondylosis   . CHF (congestive heart failure) (HCC)    chronic and stable per MD note  . Chronic kidney disease    chronic  kidney disease per MD notes  . Chronic schizophrenia ([facility])   . COPD (chronic obstructive pulmonary disease) ([facility])   . Dementia   . Depressive disorder   . DM (diabetes mellitus) ([facility])    DIET CONTROLLED  . Foley catheter in place   . GERD (gastroesophageal reflux disease)   . Glaucoma   . Hyperlipidemia   . Hypertension   . On home oxygen therapy    2 L PER NASAL CANNULA  . Osteoporosis   . Schizophrenia ([facility])   . Stenosis of lumbosacral spine   . Stroke (HCC)    L hemiparesis  . Wheelchair dependent      Medications:   Medication List       Accurate as of 08/19/16  3:44 PM. Always use your most recent med list.          acetaminophen 500 MG tablet Commonly known as:  TYLENOL Take 500 mg by mouth every 8 (eight) hours as needed for mild pain or fever.   albuterol (2.5 MG/3ML) 0.083% nebulizer solution Commonly known as:  PROVENTIL Take 2.5 mg by nebulization 3 (three) times daily.   amLODipine 5 MG tablet Commonly known as:  NORVASC Take 5 mg by mouth daily with breakfast.   aspirin 81 MG chewable tablet Chew 1 tablet (81 mg total) by mouth daily.   budesonide 0.5 MG/2ML nebulizer solution Commonly known as:  PULMICORT Take 0.5 mg by nebulization 2 (  two) times daily.   cycloSPORINE 0.05 % ophthalmic emulsion Commonly known as:  RESTASIS Place 1 drop into both eyes 2 (two) times daily.   donepezil 10 MG tablet Commonly known as:  ARICEPT Take 10 mg by mouth at bedtime.   estradiol 0.1 MG/GM vaginal cream Commonly known as:  ESTRACE Place 1 Applicatorful vaginally once a week. Monday and Friday   fluPHENAZine decanoate 25 MG/ML injection Commonly known as:  PROLIXIN Inject 12.5 mg into the muscle every 30 (thirty) days.   fluticasone 50 MCG/ACT nasal spray Commonly known as:  FLONASE Place 2 sprays into the nose every evening. Reported on 11/10/2015   hydrALAZINE 25 MG tablet Commonly known as:  APRESOLINE Take 25 mg by mouth 2 (two) times  daily.   MYRBETRIQ 25 MG Tb24 tablet Generic drug:  mirabegron ER Take 25 mg by mouth at bedtime.   olopatadine 0.1 % ophthalmic solution Commonly known as:  PATANOL Place 1 drop into both eyes 2 (two) times daily.   OXYGEN Inhale 2 L into the lungs continuous.   polyethylene glycol packet Commonly known as:  MIRALAX / GLYCOLAX Take 17 g by mouth daily.   saccharomyces boulardii 250 MG capsule Commonly known as:  FLORASTOR Take 250 mg by mouth daily.   senna 8.6 MG Tabs tablet Commonly known as:  SENOKOT Take 1 tablet (8.6 mg total) by mouth daily as needed for mild constipation.   sertraline 50 MG tablet Commonly known as:  ZOLOFT Take 50 mg by mouth daily with breakfast.   SYSTANE BALANCE OP Place 1 drop into both eyes 2 (two) times daily.   Vitamin B-12 1000 MCG Subl Place 0.5 tablets under the tongue daily with breakfast.   Vitamin D3 2000 units Tabs Take 2,000 Units by mouth every evening. For vitamin D deficiency       Immunizations: Immunization History  Administered Date(s) Administered  . Influenza Split 06/16/2013  . Influenza-Unspecified 06/23/2014, 07/04/2015, 07/03/2016  . PPD Test 12/23/2013, 01/09/2014, 05/25/2015  . Pneumococcal Polysaccharide-23 09/16/2002  . Pneumococcal-Unspecified 09/15/2001     Physical Exam:  Vitals:   08/19/16 1537  BP: 138/83  Pulse: 66  Resp: 20  Temp: 98.6 F (37 C)  TempSrc: Oral  SpO2: 100%  Weight: 210 lb 12.8 oz (95.6 kg)  Height: 5\' 9"  (1.753 m)   Body mass index is 31.13 kg/m.   General- elderly morbidly obese female in no acute distress HEENT- moist mucus membrane, no cervical lymphadenopathy Cardiovascular- normal s1,s2, no murmur, trace leg edema Respiratory- bilateral clear to auscultation, no wheeze, no rhonchi, no crackles Abdomen- bowel sounds present, soft, non tender, suprapubic catheter in place and site clean Musculoskeletal- left hemiplegia with left hand contracted, left 4th and  5th finger contracted, has a splint in place, arthritis changes in digits, hoyer transfer, diminished pedal pulses Neurological- alert and oriented to person     Labs reviewed: Basic Metabolic Panel:  Recent Labs  01/21/16 0428  02/27/16 1229  04/17/16 1200 04/18/16 0449 04/29/16 06/25/16  NA 143  < > 140  < > 139 138 143 140  K 3.2*  < > 4.2  < > 4.3 3.9 3.8 4.0  CL 107  < > 110  --  107 108  --   --   CO2 27  < > 22  --  28 24  --   --   GLUCOSE 115*  < > 107*  --  88 99  --   --  BUN 8  < > 8  < > 14 10 15 13   CREATININE 0.59  < > 0.42*  < > 0.70 0.59 0.7 0.7  CALCIUM 9.8  < > 9.5  --  9.8 9.2  --   --   MG 1.7  --   --   --   --   --   --   --   PHOS 3.0  --   --   --   --   --   --   --   < > = values in this interval not displayed. Liver Function Tests:  Recent Labs  01/21/16 0428 02/24/16 2055 03/28/16 04/17/16 1200 06/25/16  AST 14* 13* 10* 16 9*  ALT 12* 10* 7 10* 7  ALKPHOS 82 68 85 84 112  BILITOT 0.5 0.5  --  0.3  --   PROT 6.8 6.2*  --  7.1  --   ALBUMIN 3.6 3.1*  --  3.6  --    No results for input(s): LIPASE, AMYLASE in the last 8760 hours. No results for input(s): AMMONIA in the last 8760 hours. CBC:  Recent Labs  02/17/16 2239 02/24/16 2055  02/27/16 1229  04/17/16 1200 04/18/16 0449 04/29/16 06/25/16  WBC 8.6 5.9  < > 6.1  < > 8.0 7.2 6.2 5.8  NEUTROABS 5.7 3.6  --   --   --  5.6  --   --   --   HGB 11.6* 9.8*  < > 10.6*  < > 11.1* 10.9* 10.1* 10.2*  HCT 37.7 32.9*  < > 35.1*  < > 36.0 35.5* 34* 34*  MCV 86.3 86.4  < > 85.8  --  86.3 86.4  --   --   PLT 188 158  < > PLATELET CLUMPS NOTED ON SMEAR, UNABLE TO ESTIMATE  < > 189 190 191 184  < > = values in this interval not displayed.     Assessment/Plan   Hypertensive heart and renal disease Stable BP reading, continue amlodipine 5 mg daily, hydralazine 25 mg bid and monitor BP reading continue aspirin.   Neurogenic bladder Monitor her urine output, continue suprapubic catheter  care  Chronic depression Stable mood this visit. Continue zoloft 50 mg daily and her fluphenazine  Schizophrenia Followed by psych service, continue fluphenazine and monitor behavior  Mixed alzheimer's and vascular dementia No behavioral disturbance. Provide supportive care. Continue donepezil and aspirin.       Blanchie Serve, MD Internal Medicine [facility] [address] [place], [place] [address] Cell Phone (Monday-Friday 8 am - 5 pm): [phone] On Call: [phone] and follow prompts after 5 pm and on weekends Office Phone: [phone] Office Fax: [phone]

## 2016-09-26 ENCOUNTER — Non-Acute Institutional Stay (SKILLED_NURSING_FACILITY): Payer: Medicare Other | Admitting: Internal Medicine

## 2016-09-26 ENCOUNTER — Encounter: Payer: Self-pay | Admitting: Internal Medicine

## 2016-09-26 DIAGNOSIS — J181 Lobar pneumonia, unspecified organism: Secondary | ICD-10-CM

## 2016-09-26 DIAGNOSIS — N319 Neuromuscular dysfunction of bladder, unspecified: Secondary | ICD-10-CM

## 2016-09-26 DIAGNOSIS — J189 Pneumonia, unspecified organism: Secondary | ICD-10-CM

## 2016-09-26 DIAGNOSIS — L89322 Pressure ulcer of left buttock, stage 2: Secondary | ICD-10-CM

## 2016-09-26 NOTE — Progress Notes (Signed)
LOCATION: [facility]  PCP: Blanchie Serve, MD   Code Status: Full Code  Goals of care: Advanced Directive information Advanced Directives 04/17/2016  Does Patient Have a Medical Advance Directive? No  Would patient like information on creating a medical advance directive? No - patient declined information  Pre-existing out of facility DNR order (yellow form or pink MOST form) -       Extended Emergency Contact Information Primary Emergency Contact: Dern-Dean,Sharone Address: [place], [place] [address] Johnnette Litter of [place] Phone: [phone] Mobile Phone: [phone] Relation: Daughter Secondary Emergency Contact: Dean,Eric Dr. Address: [id number]          [place], [place] [address] Johnnette Litter of [place] Phone: [phone] Mobile Phone: [phone] Relation: Relative   Allergies  Allergen Reactions  . Codeine     Documented on MAR  . Lisinopril     Per MAR leg weakness  . Other     PAIN MEDICATIONS "KNOCK PT OUT" FOR SEVERAL DAYS.  PT ACCEPTS NO BLOOD PRODUCTS.     Chief Complaint  Patient presents with  . Medical Management of Chronic Issues    Routine Visit      HPI:  83 y.o. female seen today for routine visit. She has left lower lobe pneumonia and is now on antibiotic. No fever reported per nursing staff. She has been at her baseline otherwise.   Review of Systems:  Constitutional: Negative for fever Respiratory: positive for cough. On o2 by nasal canula.  Cardiovascular: Negative for chest pain Gastrointestinal: Negative for heartburn, nausea, vomiting, abdominal pain.   appetite has been good. Skin: Negative for itching, rash.  Neurological: Negative for dizziness.   Past Medical History:  Diagnosis Date  . Acute sinusitis   . Anemia   . Arthritis   . Breast cancer ([place])   . Cervical spondylosis   . CHF (congestive heart failure) (HCC)    chronic and stable per MD note  . Chronic kidney disease     chronic kidney disease per MD notes  . Chronic schizophrenia ([place])   . COPD (chronic obstructive pulmonary disease) ([place])   . Dementia   . Depressive disorder   . DM (diabetes mellitus) ([place])    DIET CONTROLLED  . Foley catheter in place   . GERD (gastroesophageal reflux disease)   . Glaucoma   . Hyperlipidemia   . Hypertension   . On home oxygen therapy    2 L PER NASAL CANNULA  . Osteoporosis   . Schizophrenia ([place])   . Stenosis of lumbosacral spine   . Stroke (HCC)    L hemiparesis  . Wheelchair dependent      Medications: Allergies as of 09/26/2016      Reactions   Codeine    Documented on MAR   Lisinopril    Per MAR leg weakness   Other    PAIN MEDICATIONS "KNOCK PT OUT" FOR SEVERAL DAYS. PT ACCEPTS NO BLOOD PRODUCTS.       Medication List       Accurate as of 09/26/16  4:41 PM. Always use your most recent med list.          acetaminophen 500 MG tablet Commonly known as:  TYLENOL Take 500 mg by mouth every 8 (eight) hours as needed for mild pain or fever.   albuterol (2.5 MG/3ML) 0.083% nebulizer solution Commonly known as:  PROVENTIL Take 2.5 mg by nebulization  3 (three) times daily.   amLODipine 5 MG tablet Commonly known as:  NORVASC Take 5 mg by mouth daily with breakfast.   aspirin 81 MG chewable tablet Chew 1 tablet (81 mg total) by mouth daily.   budesonide 0.5 MG/2ML nebulizer solution Commonly known as:  PULMICORT Take 0.5 mg by nebulization 2 (two) times daily.   cycloSPORINE 0.05 % ophthalmic emulsion Commonly known as:  RESTASIS Place 1 drop into both eyes 2 (two) times daily.   donepezil 10 MG tablet Commonly known as:  ARICEPT Take 10 mg by mouth at bedtime.   estradiol 0.1 MG/GM vaginal cream Commonly known as:  ESTRACE Place 1 Applicatorful vaginally once a week. Monday and Friday   fluPHENAZine decanoate 25 MG/ML injection Commonly known as:  PROLIXIN Inject 12.5 mg into the muscle every 30 (thirty) days.     fluticasone 50 MCG/ACT nasal spray Commonly known as:  FLONASE Place 2 sprays into the nose every evening. Reported on 11/10/2015   guaiFENesin-dextromethorphan 100-10 MG/5ML syrup Commonly known as:  ROBITUSSIN DM Take 10 mLs by mouth every 6 (six) hours as needed (Pneumonia). Stop date 09/30/16   hydrALAZINE 25 MG tablet Commonly known as:  APRESOLINE Take 25 mg by mouth 2 (two) times daily.   levofloxacin 750 MG tablet Commonly known as:  LEVAQUIN Take 750 mg by mouth daily. Stop date 10/04/16   MYRBETRIQ 25 MG Tb24 tablet Generic drug:  mirabegron ER Take 25 mg by mouth at bedtime.   olopatadine 0.1 % ophthalmic solution Commonly known as:  PATANOL Place 1 drop into both eyes 2 (two) times daily.   OXYGEN Inhale 2 L into the lungs continuous.   polyethylene glycol packet Commonly known as:  MIRALAX / GLYCOLAX Take 17 g by mouth daily.   saccharomyces boulardii 250 MG capsule Commonly known as:  FLORASTOR Take 250 mg by mouth 2 (two) times daily. 10/25/16   saccharomyces boulardii 250 MG capsule Commonly known as:  FLORASTOR Take 250 mg by mouth daily.   senna 8.6 MG Tabs tablet Commonly known as:  SENOKOT Take 1 tablet (8.6 mg total) by mouth daily as needed for mild constipation.   sertraline 50 MG tablet Commonly known as:  ZOLOFT Take 50 mg by mouth daily with breakfast.   SYSTANE BALANCE OP Place 1 drop into both eyes 2 (two) times daily.   Vitamin B-12 1000 MCG Subl Place 0.5 tablets under the tongue daily with breakfast.   Vitamin D3 2000 units Tabs Take 2,000 Units by mouth every evening. For vitamin D deficiency       Immunizations: Immunization History  Administered Date(s) Administered  . Influenza Split 06/16/2013  . Influenza-Unspecified 06/23/2014, 07/04/2015, 07/03/2016  . PPD Test 12/23/2013, 01/09/2014, 05/25/2015  . Pneumococcal Polysaccharide-23 09/16/2002  . Pneumococcal-Unspecified 09/15/2001     Physical Exam:  Vitals:    09/26/16 1546  BP: (!) 152/86  Pulse: 84  Resp: 18  Temp: 97.5 F (36.4 C)  TempSrc: Oral  SpO2: 95%  Weight: 209 lb (94.8 kg)  Height: 5\' 9"  (1.753 m)   Body mass index is 30.86 kg/m.   General- elderly obese female in no acute distress HEENT- moist mucus membrane, no cervical lymphadenopathy Cardiovascular- normal s1,s2, no murmur, trace leg edema Respiratory- bilateral poor air entry, no wheeze, no rhonchi, no crackles Abdomen- bowel sounds present, soft, non tender, suprapubic catheter in place and site clean Musculoskeletal- left hemiplegia with left hand contracted, left 4th and 5th finger contracted, has a splint  in place, arthritis changes in digits, hoyer transfer, diminished pedal pulses Neurological- alert and oriented to self    Labs reviewed: Basic Metabolic Panel:  Recent Labs  01/21/16 0428  02/27/16 1229  04/17/16 1200 04/18/16 0449 04/29/16 06/25/16  NA 143  < > 140  < > 139 138 143 140  K 3.2*  < > 4.2  < > 4.3 3.9 3.8 4.0  CL 107  < > 110  --  107 108  --   --   CO2 27  < > 22  --  28 24  --   --   GLUCOSE 115*  < > 107*  --  88 99  --   --   BUN 8  < > 8  < > 14 10 15 13   CREATININE 0.59  < > 0.42*  < > 0.70 0.59 0.7 0.7  CALCIUM 9.8  < > 9.5  --  9.8 9.2  --   --   MG 1.7  --   --   --   --   --   --   --   PHOS 3.0  --   --   --   --   --   --   --   < > = values in this interval not displayed. Liver Function Tests:  Recent Labs  01/21/16 0428 02/24/16 2055 03/28/16 04/17/16 1200 06/25/16  AST 14* 13* 10* 16 9*  ALT 12* 10* 7 10* 7  ALKPHOS 82 68 85 84 112  BILITOT 0.5 0.5  --  0.3  --   PROT 6.8 6.2*  --  7.1  --   ALBUMIN 3.6 3.1*  --  3.6  --    No results for input(s): LIPASE, AMYLASE in the last 8760 hours. No results for input(s): AMMONIA in the last 8760 hours. CBC:  Recent Labs  02/17/16 2239 02/24/16 2055  02/27/16 1229  04/17/16 1200 04/18/16 0449 04/29/16 06/25/16  WBC 8.6 5.9  < > 6.1  < > 8.0 7.2 6.2 5.8    NEUTROABS 5.7 3.6  --   --   --  5.6  --   --   --   HGB 11.6* 9.8*  < > 10.6*  < > 11.1* 10.9* 10.1* 10.2*  HCT 37.7 32.9*  < > 35.1*  < > 36.0 35.5* 34* 34*  MCV 86.3 86.4  < > 85.8  --  86.3 86.4  --   --   PLT 188 158  < > PLATELET CLUMPS NOTED ON SMEAR, UNABLE TO ESTIMATE  < > 189 190 191 184  < > = values in this interval not displayed.     Assessment/Plan  Left lower lobe pneumonia On Levaquin 750 mg daily for total of 10 days. Continue Florastor. Continue guaifenesin with dextromethorphan 10 mL every 6 hours for 5 days. Monitor vital signs. Monitor her breathing status. Aspiration precautions to be taken. Continue albuterol 3 times a day.  Neurogenic bladder Has suprapubic catheter in place. Continue catheter care. Continue Myrbetriq  Stage II pressure ulcer left buttock Clean with normal saline, apply skin prep to the periwound area, apply hydrogel to the wound bed and comfortable with collagen, Allevyn dressing every 3 days. Monitor for healing. Pressure ulcer prophylaxis to be taken.   Blanchie Serve, MD Internal Medicine [facility] [address] [place], [place] [address] Cell Phone (Monday-Friday 8 am - 5 pm): [phone] On Call: [phone] and  follow prompts after 5 pm and on weekends Office Phone: [phone] Office Fax: [phone]

## 2016-10-21 NOTE — Telephone Encounter (Signed)
Error

## 2016-11-17 ENCOUNTER — Emergency Department (HOSPITAL_COMMUNITY)
Admission: EM | Admit: 2016-11-17 | Discharge: 2016-11-17 | Disposition: A | Discharge status: Home / Self Care | Payer: Medicare Other | Attending: Emergency Medicine | Admitting: Emergency Medicine

## 2016-11-17 ENCOUNTER — Emergency Department (HOSPITAL_COMMUNITY): Payer: Medicare Other

## 2016-11-17 ENCOUNTER — Encounter (HOSPITAL_COMMUNITY): Payer: Self-pay | Admitting: Emergency Medicine

## 2016-11-17 DIAGNOSIS — N182 Chronic kidney disease, stage 2 (mild): Secondary | ICD-10-CM | POA: Diagnosis not present

## 2016-11-17 DIAGNOSIS — Z7982 Long term (current) use of aspirin: Secondary | ICD-10-CM | POA: Diagnosis not present

## 2016-11-17 DIAGNOSIS — W19XXXA Unspecified fall, initial encounter: Secondary | ICD-10-CM | POA: Diagnosis not present

## 2016-11-17 DIAGNOSIS — I130 Hypertensive heart and chronic kidney disease with heart failure and stage 1 through stage 4 chronic kidney disease, or unspecified chronic kidney disease: Secondary | ICD-10-CM | POA: Insufficient documentation

## 2016-11-17 DIAGNOSIS — Y999 Unspecified external cause status: Secondary | ICD-10-CM | POA: Diagnosis not present

## 2016-11-17 DIAGNOSIS — Z87891 Personal history of nicotine dependence: Secondary | ICD-10-CM | POA: Insufficient documentation

## 2016-11-17 DIAGNOSIS — E1140 Type 2 diabetes mellitus with diabetic neuropathy, unspecified: Secondary | ICD-10-CM | POA: Diagnosis not present

## 2016-11-17 DIAGNOSIS — S0083XA Contusion of other part of head, initial encounter: Secondary | ICD-10-CM | POA: Insufficient documentation

## 2016-11-17 DIAGNOSIS — I5022 Chronic systolic (congestive) heart failure: Secondary | ICD-10-CM | POA: Diagnosis not present

## 2016-11-17 DIAGNOSIS — Z8673 Personal history of transient ischemic attack (TIA), and cerebral infarction without residual deficits: Secondary | ICD-10-CM | POA: Insufficient documentation

## 2016-11-17 DIAGNOSIS — J449 Chronic obstructive pulmonary disease, unspecified: Secondary | ICD-10-CM | POA: Diagnosis not present

## 2016-11-17 DIAGNOSIS — Z853 Personal history of malignant neoplasm of breast: Secondary | ICD-10-CM | POA: Diagnosis not present

## 2016-11-17 DIAGNOSIS — G309 Alzheimer's disease, unspecified: Secondary | ICD-10-CM | POA: Insufficient documentation

## 2016-11-17 DIAGNOSIS — Y92129 Unspecified place in nursing home as the place of occurrence of the external cause: Secondary | ICD-10-CM | POA: Diagnosis not present

## 2016-11-17 DIAGNOSIS — Y939 Activity, unspecified: Secondary | ICD-10-CM | POA: Insufficient documentation

## 2016-11-17 DIAGNOSIS — Z96652 Presence of left artificial knee joint: Secondary | ICD-10-CM | POA: Insufficient documentation

## 2016-11-17 DIAGNOSIS — S0993XA Unspecified injury of face, initial encounter: Secondary | ICD-10-CM | POA: Diagnosis present

## 2016-11-17 MED ORDER — ACETAMINOPHEN 325 MG PO TABS
650.0000 mg | ORAL_TABLET | Freq: Once | ORAL | Status: AC
  Administered 2016-11-17: 650 mg via ORAL
  Filled 2016-11-17: qty 2

## 2016-11-17 NOTE — ED Notes (Signed)
Bed: WA09 Expected date: 11/17/16 Expected time: 5:05 PM Means of arrival: Ambulance Comments: [facility]

## 2016-11-17 NOTE — ED Triage Notes (Signed)
Per PTAR, patient had unwitnessed fall from wheelchair striking her nose on window seal. Reports she was reaching for something on window seal. Small laceration noted to bridge of nose. Laceration to 4th toe on right foot noted. Denies blood thinner use. Patient is from [facility]. Hx of dementia.

## 2016-11-17 NOTE — ED Notes (Signed)
FIFTH TOE ON RIGHT FOOT BANDAGED WITH 2X2 AND 2 [facility]

## 2016-11-17 NOTE — ED Provider Notes (Signed)
[facility] DEPT Provider Note   CSN: [id number] Arrival date & time: 11/17/16  1706     History   Chief Complaint Chief Complaint  Patient presents with  . Fall    HPI [name] is a 83 y.o. female.  83 year old female here after unwitnessed fall from the nursing home. She does have history of dementia. Has a superficial laceration to the bridge of her nose. Complains of mild paraspinal neck pain. Notes of lower lumbar pain. Also sustained a sufficient laceration to her right fourth toe. Denies any hip pain. No chest or abdominal discomfort. Sent here by EMS for evaluation.      Past Medical History:  Diagnosis Date  . Acute sinusitis   . Anemia   . Arthritis   . Breast cancer ([place])   . Cervical spondylosis   . CHF (congestive heart failure) (HCC)    chronic and stable per MD note  . Chronic kidney disease    chronic kidney disease per MD notes  . Chronic schizophrenia ([place])   . COPD (chronic obstructive pulmonary disease) ([place])   . Dementia   . Depressive disorder   . DM (diabetes mellitus) ([place])    DIET CONTROLLED  . Foley catheter in place   . GERD (gastroesophageal reflux disease)   . Glaucoma   . Hyperlipidemia   . Hypertension   . On home oxygen therapy    2 L PER NASAL CANNULA  . Osteoporosis   . Schizophrenia ([place])   . Stenosis of lumbosacral spine   . Stroke (HCC)    L hemiparesis  . Wheelchair dependent     Patient Active Problem List   Diagnosis Date Noted  . Contracture of hand joint 04/22/2016  . Stage II pressure ulcer of left buttock 04/18/2016  . Complicated UTI (urinary tract infection) 04/17/2016  . Acute encephalopathy 04/17/2016  . Diabetes mellitus type 2 in obese ([place]) 03/27/2016  . History of CVA with residual deficit 03/01/2016  . Chronic constipation 03/01/2016  . Major depression, chronic 03/01/2016  . Anemia, chronic disease 03/01/2016  . ESBL (extended spectrum beta-lactamase) producing bacteria infection 02/29/2016    . Altered mental status 02/25/2016  . Encephalopathy acute 02/25/2016  . Urinary tract infection 02/25/2016  . Type 2 diabetes mellitus with diabetic neuropathy, without long-term current use of insulin ([place]) 01/10/2016  . Allergic conjunctivitis and rhinitis 12/07/2015  . Essential hypertension, benign 11/10/2015  . Constipation, chronic 11/10/2015  . Hyperlipidemia 04/15/2015  . B12 deficiency 04/15/2015  . Depression due to dementia 03/02/2015  . Dysphagia 01/23/2015  . Neurogenic dysfunction of the urinary bladder 01/09/2015  . Major depressive disorder, recurrent, in remission ([place]) 01/09/2015  . Pain, neuropathic 01/09/2015  . Hypertensive heart and renal disease with congestive heart failure ([place]) 01/09/2015  . Chronic systolic heart failure ([place]) 01/09/2015  . Type 2 diabetes mellitus with polyneuropathy ([place]) 01/09/2015  . Chronic respiratory failure with hypoxia ([place]) 10/14/2014  . CKD stage 2 due to type 2 diabetes mellitus ([place]) 10/14/2014  . Neuropathic pain 09/15/2014  . Senile osteoporosis 09/15/2014  . Primary generalized (osteo)arthritis 09/15/2014  . Neurogenic bladder 06/13/2014  . Hemiplegia of nondominant side following CVA (cerebrovascular accident) ([place]) 06/13/2014  . Chronic respiratory failure, unspecified whether with hypoxia or hypercapnia ([place]) 06/13/2014  . Osteoporosis, unspecified 06/13/2014  . Anemia of chronic disease 06/13/2014  . Vascular dementia 04/14/2014  . Schizophrenia, chronic condition ([place]) 04/14/2014  . Benign hypertensive heart and kidney disease with heart failure and with  chronic kidney disease stage I through stage IV, or unspecified(404.11) 04/14/2014  . Type II or unspecified type diabetes mellitus with neurological manifestations, not stated as uncontrolled(250.60) 04/14/2014  . Urinary retention with incomplete bladder emptying 04/12/2014  . Unspecified vitamin D deficiency 02/28/2014  . Hemiplegia affecting nondominant side,  late effect of cerebrovascular disease 02/28/2014  . Polyneuropathy in diabetes(357.2) 02/28/2014  . Esophageal reflux 02/28/2014  . Peripheral angiopathy in diseases classified elsewhere ([place]) 02/28/2014  . Anemia   . Chronic schizophrenia ([place])   . Mixed Alzheimer's and vascular dementia   . Depressive disorder   . Stroke ([place])   . COPD (chronic obstructive pulmonary disease) ([place]) 05/13/2012    Past Surgical History:  Procedure Laterality Date  . APPENDECTOMY    . BREAST SURGERY     breast cancer  . CATARACT EXTRACTION, BILATERAL    . CHOLECYSTECTOMY    . INSERTION OF SUPRAPUBIC CATHETER N/A 04/29/2014   Procedure: INSERTION OF SUPRAPUBIC CATHETER;  Surgeon: Arvil Persons, MD;  Location: [facility] ORS;  Service: Urology;  Laterality: N/A;  . JOINT REPLACEMENT     L TOTAL KNEE  . UTERINE SUSPENSION      OB History    No data available       Home Medications    Prior to Admission medications   Medication Sig Start Date End Date Taking? Authorizing Provider  acetaminophen (TYLENOL) 500 MG tablet Take 500 mg by mouth every 8 (eight) hours as needed for mild pain or fever.     Historical Provider, MD  albuterol (PROVENTIL) (2.5 MG/3ML) 0.083% nebulizer solution Take 2.5 mg by nebulization 3 (three) times daily.    Historical Provider, MD  amLODipine (NORVASC) 5 MG tablet Take 5 mg by mouth daily with breakfast.     Historical Provider, MD  aspirin 81 MG chewable tablet Chew 1 tablet (81 mg total) by mouth daily. 02/29/16   Nishant Dhungel, MD  budesonide (PULMICORT) 0.5 MG/2ML nebulizer solution Take 0.5 mg by nebulization 2 (two) times daily.    Historical Provider, MD  Cholecalciferol (VITAMIN D3) 2000 UNITS TABS Take 2,000 Units by mouth every evening. For vitamin D deficiency     Historical Provider, MD  Cyanocobalamin (VITAMIN B-12) 1000 MCG SUBL Place 0.5 tablets under the tongue daily with breakfast.    Historical Provider, MD  cycloSPORINE (RESTASIS) 0.05 % ophthalmic emulsion  Place 1 drop into both eyes 2 (two) times daily.     Historical Provider, MD  donepezil (ARICEPT) 10 MG tablet Take 10 mg by mouth at bedtime.     Historical Provider, MD  estradiol (ESTRACE) 0.1 MG/GM vaginal cream Place 1 Applicatorful vaginally once a week. Monday and Friday    Historical Provider, MD  fluPHENAZine decanoate (PROLIXIN) 25 MG/ML injection Inject 12.5 mg into the muscle every 30 (thirty) days.     Historical Provider, MD  fluticasone (FLONASE) 50 MCG/ACT nasal spray Place 2 sprays into the nose every evening. Reported on 11/10/2015    Historical Provider, MD  guaiFENesin-dextromethorphan (ROBITUSSIN DM) 100-10 MG/5ML syrup Take 10 mLs by mouth every 6 (six) hours as needed (Pneumonia). Stop date 09/30/16    Historical Provider, MD  hydrALAZINE (APRESOLINE) 25 MG tablet Take 25 mg by mouth 2 (two) times daily.    Historical Provider, MD  levofloxacin (LEVAQUIN) 750 MG tablet Take 750 mg by mouth daily. Stop date 10/04/16    Historical Provider, MD  mirabegron ER (MYRBETRIQ) 25 MG TB24 tablet Take 25 mg by mouth at  bedtime.     Historical Provider, MD  olopatadine (PATANOL) 0.1 % ophthalmic solution Place 1 drop into both eyes 2 (two) times daily.     Historical Provider, MD  OXYGEN Inhale 2 L into the lungs continuous.     Historical Provider, MD  polyethylene glycol (MIRALAX / GLYCOLAX) packet Take 17 g by mouth daily. 02/29/16   Nishant Dhungel, MD  Propylene Glycol (SYSTANE BALANCE OP) Place 1 drop into both eyes 2 (two) times daily.     Historical Provider, MD  saccharomyces boulardii (FLORASTOR) 250 MG capsule Take 250 mg by mouth 2 (two) times daily. 10/25/16    Historical Provider, MD  saccharomyces boulardii (FLORASTOR) 250 MG capsule Take 250 mg by mouth daily.    Historical Provider, MD  senna (SENOKOT) 8.6 MG TABS tablet Take 1 tablet (8.6 mg total) by mouth daily as needed for mild constipation. 02/29/16   Nishant Dhungel, MD  sertraline (ZOLOFT) 50 MG tablet Take 50 mg by mouth  daily with breakfast.     Historical Provider, MD    Family History Family History  Problem Relation Age of Onset  . Asthma Daughter   . Heart disease Sister     3 sisters  . Heart disease Brother     x 2  . Cancer Brother     multiple myloma    Social History Social History  Substance Use Topics  . Smoking status: Former Smoker    Packs/day: 1.00    Years: 15.00    Types: Cigarettes    Quit date: 09/16/1997  . Smokeless tobacco: Never Used  . Alcohol use No     Allergies   Codeine; Lisinopril; and Other   Review of Systems Review of Systems  All other systems reviewed and are negative.    Physical Exam Updated Vital Signs BP 126/76 (BP Location: Right Arm)   Pulse 68   Temp 97.8 F (36.6 C) (Oral)   Resp 18   Ht 5\' 8"  (1.727 m)   Wt 94.8 kg   SpO2 97%   BMI 31.78 kg/m   Physical Exam  Constitutional: She is oriented to person, place, and time. She appears well-developed and well-nourished.  Non-toxic appearance. No distress.  HENT:  Head: Normocephalic and atraumatic.  Nose:    Eyes: Conjunctivae, EOM and lids are normal. Pupils are equal, round, and reactive to light.  Neck: Normal range of motion. Neck supple. No tracheal deviation present. No thyroid mass present.  Cardiovascular: Normal rate, regular rhythm and normal heart sounds.  Exam reveals no gallop.   No murmur heard. Pulmonary/Chest: Effort normal and breath sounds normal. No stridor. No respiratory distress. She has no decreased breath sounds. She has no wheezes. She has no rhonchi. She has no rales.  Abdominal: Soft. Normal appearance and bowel sounds are normal. She exhibits no distension. There is no tenderness. There is no rebound and no CVA tenderness.  Musculoskeletal: Normal range of motion. She exhibits no edema or tenderness.       Back:       Feet:  Neurological: She is alert and oriented to person, place, and time. She has normal strength. No cranial nerve deficit or sensory  deficit. GCS eye subscore is 4. GCS verbal subscore is 5. GCS motor subscore is 6.  Skin: Skin is warm and dry. No abrasion and no rash noted.  Psychiatric: Her speech is normal and behavior is normal. Her affect is blunt.  Nursing note and vitals reviewed.  ED Treatments / Results  Labs (all labs ordered are listed, but only abnormal results are displayed) Labs Reviewed - No data to display  EKG  EKG Interpretation None       Radiology No results found.  Procedures Procedures (including critical care time)  Medications Ordered in ED Medications - No data to display   Initial Impression / Assessment and Plan / ED Course  I have reviewed the triage vital signs and the nursing notes.  Pertinent labs & imaging results that were available during my care of the patient were reviewed by me and considered in my medical decision making (see chart for details).     X-rays without acute findings at this time. Right fourth toe dressed by nursing and patient stable for discharge  Final Clinical Impressions(s) / ED Diagnoses   Final diagnoses:  None    New Prescriptions New Prescriptions   No medications on file     Lacretia Leigh, MD 11/17/16 [phone]

## 2016-11-25 ENCOUNTER — Non-Acute Institutional Stay (SKILLED_NURSING_FACILITY): Payer: Medicare Other | Admitting: Internal Medicine

## 2016-11-25 ENCOUNTER — Encounter: Payer: Self-pay | Admitting: Internal Medicine

## 2016-11-25 DIAGNOSIS — I110 Hypertensive heart disease with heart failure: Secondary | ICD-10-CM | POA: Diagnosis not present

## 2016-11-25 DIAGNOSIS — J9610 Chronic respiratory failure, unspecified whether with hypoxia or hypercapnia: Secondary | ICD-10-CM

## 2016-11-25 DIAGNOSIS — K5909 Other constipation: Secondary | ICD-10-CM

## 2016-11-25 DIAGNOSIS — I6930 Unspecified sequelae of cerebral infarction: Secondary | ICD-10-CM | POA: Diagnosis not present

## 2016-11-25 DIAGNOSIS — L89322 Pressure ulcer of left buttock, stage 2: Secondary | ICD-10-CM

## 2016-11-25 NOTE — Progress Notes (Signed)
LOCATION: [name]  PCP: Blanchie Serve, MD   Code Status: Full Code  Goals of care: Advanced Directive information Advanced Directives 11/17/2016  Does Patient Have a Medical Advance Directive? (No Data)  Would patient like information on creating a medical advance directive? -  Pre-existing out of facility DNR order (yellow form or pink MOST form) -       Extended Emergency Contact Information Primary Emergency Contact: Prospero-Dean,Sharone Address: [place], [place] [address] Johnnette Litter of [place] Phone: [phone] Mobile Phone: [phone] Relation: Daughter Secondary Emergency Contact: Dean,Eric Dr. Address: [address] [address]          [place], [place] [address] Johnnette Litter of [place] Phone: [phone] Mobile Phone: [phone] Relation: Relative   Allergies  Allergen Reactions  . Codeine     Documented on MAR  . Lisinopril     Per MAR leg weakness  . Other     PAIN MEDICATIONS "KNOCK PT OUT" FOR SEVERAL DAYS.  PT ACCEPTS NO BLOOD PRODUCTS.     Chief Complaint  Patient presents with  . Medical Management of Chronic Issues    Routine Visit      HPI:  83 y.o. female seen today for routine visit. She has been at her baseline per nursing. She is able to express her needs. She is in bed all day. She denies any pain this visit. She has dementia and this limits her participation in HPI and ROS. She has stage 2 pressure ulcer and receives treatment for this. She remains a high aspiration risk.   Review of Systems:  Constitutional: Negative for fever Respiratory: negative for dyspnea. Positive for occasional cough Cardiovascular: Negative for chest pain Gastrointestinal: Negative for heartburn, nausea, vomiting, abdominal pain. Skin: Negative for itching, rash.  Neurological: Negative for dizziness.   Past Medical History:  Diagnosis Date  . Acute sinusitis   . Anemia   . Arthritis   . Breast cancer ([place])   .  Cervical spondylosis   . CHF (congestive heart failure) (HCC)    chronic and stable per MD note  . Chronic kidney disease    chronic kidney disease per MD notes  . Chronic schizophrenia ([place])   . COPD (chronic obstructive pulmonary disease) ([place])   . Dementia   . Depressive disorder   . DM (diabetes mellitus) ([place])    DIET CONTROLLED  . Foley catheter in place   . GERD (gastroesophageal reflux disease)   . Glaucoma   . Hyperlipidemia   . Hypertension   . On home oxygen therapy    2 L PER NASAL CANNULA  . Osteoporosis   . Schizophrenia ([place])   . Stenosis of lumbosacral spine   . Stroke (HCC)    L hemiparesis  . Wheelchair dependent      Medications: Allergies as of 11/25/2016      Reactions   Codeine    Documented on MAR   Lisinopril    Per MAR leg weakness   Other    PAIN MEDICATIONS "KNOCK PT OUT" FOR SEVERAL DAYS. PT ACCEPTS NO BLOOD PRODUCTS.       Medication List       Accurate as of 11/25/16 11:20 AM. Always use your most recent med list.          acetaminophen 500 MG tablet Commonly known as:  TYLENOL Take 500 mg by mouth every 8 (eight) hours as needed for mild pain  or fever.   albuterol (2.5 MG/3ML) 0.083% nebulizer solution Commonly known as:  PROVENTIL Take 2.5 mg by nebulization 3 (three) times daily.   amLODipine 5 MG tablet Commonly known as:  NORVASC Take 5 mg by mouth daily with breakfast.   aspirin 81 MG chewable tablet Chew 1 tablet (81 mg total) by mouth daily.   budesonide 0.5 MG/2ML nebulizer solution Commonly known as:  PULMICORT Take 0.5 mg by nebulization 2 (two) times daily.   donepezil 10 MG tablet Commonly known as:  ARICEPT Take 10 mg by mouth at bedtime.   estradiol 0.1 MG/GM vaginal cream Commonly known as:  ESTRACE Place 1 Applicatorful vaginally. 2 times a week. Monday and Friday   feeding supplement (PRO-STAT SUGAR FREE 64) Liqd Take 30 mLs by mouth daily. Stop date 12/23/16   fluticasone 50 MCG/ACT nasal  spray Commonly known as:  FLONASE Place 2 sprays into the nose every evening. Reported on 11/10/2015   hydrALAZINE 25 MG tablet Commonly known as:  APRESOLINE Take 25 mg by mouth 2 (two) times daily.   MULTIVITAMINS PO Take 1 tablet by mouth daily.   MYRBETRIQ 25 MG Tb24 tablet Generic drug:  mirabegron ER Take 25 mg by mouth at bedtime.   olopatadine 0.1 % ophthalmic solution Commonly known as:  PATANOL Place 1 drop into both eyes 2 (two) times daily.   OXYGEN Inhale 2 L into the lungs continuous.   polyethylene glycol packet Commonly known as:  MIRALAX / GLYCOLAX Take 17 g by mouth daily.   saccharomyces boulardii 250 MG capsule Commonly known as:  FLORASTOR Take 250 mg by mouth 2 (two) times daily. Stop date 12/17/16   senna 8.6 MG Tabs tablet Commonly known as:  SENOKOT Take 1 tablet (8.6 mg total) by mouth daily as needed for mild constipation.   sertraline 50 MG tablet Commonly known as:  ZOLOFT Take 50 mg by mouth daily with breakfast.   SYSTANE BALANCE OP Place 1 drop into both eyes 2 (two) times daily.   Vitamin B-12 1000 MCG Subl Place 0.5 tablets under the tongue daily with breakfast.   Vitamin D3 2000 units Tabs Take 2,000 Units by mouth every evening. For vitamin D deficiency       Immunizations: Immunization History  Administered Date(s) Administered  . Influenza Split 06/16/2013  . Influenza-Unspecified 06/23/2014, 07/04/2015, 07/03/2016  . PPD Test 12/23/2013, 01/09/2014, 05/25/2015  . Pneumococcal Polysaccharide-23 09/16/2002  . Pneumococcal-Unspecified 09/15/2001     Physical Exam:  Vitals:   11/25/16 1108  BP: 138/76  Pulse: 86  Resp: 18  Temp: 99.1 F (37.3 C)  TempSrc: Oral  SpO2: 95%  Weight: 212 lb (96.2 kg)  Height: 5\' 9"  (1.753 m)   Body mass index is 31.31 kg/m.   General- elderly obese female in no acute distress, chronically ill appearing.  HEENT- moist mucus membrane, no cervical  lymphadenopathy. Cardiovascular- normal s1,s2, no murmur, trace leg edema Respiratory- bilateral equal air entry, no wheeze, no rhonchi, no crackles, no use of accessory muscles Abdomen- bowel sounds present, soft, non tender, suprapubic catheter in place and site clean Musculoskeletal- left hemiplegia with left hand contracted, left 4th and 5th finger contracted, has a splint in place, arthritis changes in digits, hoyer transfer, diminished pedal pulses Neurological- alert and oriented to self Skin- stage 2 pressure ulcer left buttock, dry dressing to right 4th and 5th toes    Labs reviewed: Basic Metabolic Panel:  Recent Labs  01/21/16 0428  02/27/16 1229  04/17/16  1200 04/18/16 0449 04/29/16 06/25/16  NA 143  < > 140  < > 139 138 143 140  K 3.2*  < > 4.2  < > 4.3 3.9 3.8 4.0  CL 107  < > 110  --  107 108  --   --   CO2 27  < > 22  --  28 24  --   --   GLUCOSE 115*  < > 107*  --  88 99  --   --   BUN 8  < > 8  < > 14 10 15 13   CREATININE 0.59  < > 0.42*  < > 0.70 0.59 0.7 0.7  CALCIUM 9.8  < > 9.5  --  9.8 9.2  --   --   MG 1.7  --   --   --   --   --   --   --   PHOS 3.0  --   --   --   --   --   --   --   < > = values in this interval not displayed. Liver Function Tests:  Recent Labs  01/21/16 0428 02/24/16 2055 03/28/16 04/17/16 1200 06/25/16  AST 14* 13* 10* 16 9*  ALT 12* 10* 7 10* 7  ALKPHOS 82 68 85 84 112  BILITOT 0.5 0.5  --  0.3  --   PROT 6.8 6.2*  --  7.1  --   ALBUMIN 3.6 3.1*  --  3.6  --    No results for input(s): LIPASE, AMYLASE in the last 8760 hours. No results for input(s): AMMONIA in the last 8760 hours. CBC:  Recent Labs  02/17/16 2239 02/24/16 2055  02/27/16 1229  04/17/16 1200 04/18/16 0449 04/29/16 06/25/16  WBC 8.6 5.9  < > 6.1  < > 8.0 7.2 6.2 5.8  NEUTROABS 5.7 3.6  --   --   --  5.6  --   --   --   HGB 11.6* 9.8*  < > 10.6*  < > 11.1* 10.9* 10.1* 10.2*  HCT 37.7 32.9*  < > 35.1*  < > 36.0 35.5* 34* 34*  MCV 86.3 86.4  < > 85.8   --  86.3 86.4  --   --   PLT 188 158  < > PLATELET CLUMPS NOTED ON SMEAR, UNABLE TO ESTIMATE  < > 189 190 191 184  < > = values in this interval not displayed.     Assessment/Plan  Chronic respiratory failure With copd, no recent exacerbation reported. Continue oxygen by nasal canula, budesonide and prn albuterol  Stage II pressure ulcer left buttock Clean with normal saline, apply skin prep to the periwound area, apply hydrocolloid dressing. Monitor for healing. Pressure ulcer prophylaxis to be taken. Continue nutritional supplement to promote wound healing.   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 Continue aspirin 81 mg daily  Chronic constipation Continue daily miralax and prn senna  Hypertensive heart disease with heart failure Has systolic CHF, appears euvolemic on exam. Elevated SBP on review. Discontinue her norvasc and change her hydralazine from 25 mg bid to 25 mg tid and monitor. To review BP on weekly basis and adjust dosing if needed.    Blanchie Serve, MD Internal Medicine [facility] [address] [place], [place] [address] Cell Phone (Monday-Friday 8 am - 5 pm): [phone] On Call: [phone] and follow prompts after 5 pm and on weekends Office  Phone: [phone] Office Fax: [phone]

## 2016-12-31 ENCOUNTER — Non-Acute Institutional Stay (SKILLED_NURSING_FACILITY): Payer: Medicare Other | Admitting: Internal Medicine

## 2016-12-31 ENCOUNTER — Encounter: Payer: Self-pay | Admitting: Internal Medicine

## 2016-12-31 DIAGNOSIS — F341 Dysthymic disorder: Secondary | ICD-10-CM

## 2016-12-31 DIAGNOSIS — F329 Major depressive disorder, single episode, unspecified: Secondary | ICD-10-CM

## 2016-12-31 DIAGNOSIS — J3089 Other allergic rhinitis: Secondary | ICD-10-CM | POA: Diagnosis not present

## 2016-12-31 DIAGNOSIS — I110 Hypertensive heart disease with heart failure: Secondary | ICD-10-CM | POA: Diagnosis not present

## 2016-12-31 DIAGNOSIS — I69998 Other sequelae following unspecified cerebrovascular disease: Secondary | ICD-10-CM | POA: Diagnosis not present

## 2016-12-31 DIAGNOSIS — N952 Postmenopausal atrophic vaginitis: Secondary | ICD-10-CM | POA: Diagnosis not present

## 2016-12-31 DIAGNOSIS — N319 Neuromuscular dysfunction of bladder, unspecified: Secondary | ICD-10-CM

## 2016-12-31 NOTE — Progress Notes (Signed)
LOCATION[name]  PCP: Blanchie Serve, MD   Code Status: Full Code  Goals of care: Advanced Directive information Advanced Directives 11/17/2016  Does Patient Have a Medical Advance Directive? (No Data)  Would patient like information on creating a medical advance directive? -  Pre-existing out of facility DNR order (yellow form or pink MOST form) -       Extended Emergency Contact Information Primary Emergency Contact: Hubbs-Dean,Sharone Address: [place], [place] [address] Johnnette Litter of [place] Phone: [phone] Mobile Phone: [phone] Relation: Daughter Secondary Emergency Contact: Dean,Eric Dr. Address: [address] [address]          [place], [place] [address] Johnnette Litter of [place] Phone: [phone] Mobile Phone: [phone] Relation: Relative   Allergies  Allergen Reactions  . Codeine     Documented on MAR  . Lisinopril     Per MAR leg weakness  . Other     PAIN MEDICATIONS "KNOCK PT OUT" FOR SEVERAL DAYS.  PT ACCEPTS NO BLOOD PRODUCTS.     Chief Complaint  Patient presents with  . Medical Management of Chronic Issues    Routine Visit      HPI:  83 y.o. female seen today for routine visit. She denies any concern during her limited participation in HPI and ROS. No concerns from nursing. She is in bed all day. She denies any pain this visit.   Review of Systems:  Constitutional: Negative for fever Respiratory: negative for dyspnea and cough Cardiovascular: Negative for chest pain Gastrointestinal: Negative for heartburn, nausea, vomiting, abdominal pain. Has chronic suprapubic catheter.  Skin: Negative for itching, rash.  Neurological: Negative for dizziness.   Past Medical History:  Diagnosis Date  . Acute sinusitis   . Anemia   . Arthritis   . Breast cancer ([place])   . Cervical spondylosis   . CHF (congestive heart failure) (HCC)    chronic and stable per MD note  . Chronic kidney disease    chronic  kidney disease per MD notes  . Chronic schizophrenia ([place])   . COPD (chronic obstructive pulmonary disease) ([place])   . Dementia   . Depressive disorder   . DM (diabetes mellitus) ([place])    DIET CONTROLLED  . Foley catheter in place   . GERD (gastroesophageal reflux disease)   . Glaucoma   . Hyperlipidemia   . Hypertension   . On home oxygen therapy    2 L PER NASAL CANNULA  . Osteoporosis   . Schizophrenia ([place])   . Stenosis of lumbosacral spine   . Stroke (HCC)    L hemiparesis  . Wheelchair dependent      Medications: Allergies as of 12/31/2016      Reactions   Codeine    Documented on MAR   Lisinopril    Per MAR leg weakness   Other    PAIN MEDICATIONS "KNOCK PT OUT" FOR SEVERAL DAYS. PT ACCEPTS NO BLOOD PRODUCTS.       Medication List       Accurate as of 12/31/16  2:27 PM. Always use your most recent med list.          acetaminophen 500 MG tablet Commonly known as:  TYLENOL Take 500 mg by mouth every 8 (eight) hours as needed for mild pain or fever.   albuterol (2.5 MG/3ML) 0.083% nebulizer solution Commonly known as:  PROVENTIL Take 2.5 mg by nebulization 3 (three) times daily.  aspirin 81 MG chewable tablet Chew 1 tablet (81 mg total) by mouth daily.   budesonide 0.5 MG/2ML nebulizer solution Commonly known as:  PULMICORT Take 0.5 mg by nebulization 2 (two) times daily.   donepezil 10 MG tablet Commonly known as:  ARICEPT Take 10 mg by mouth at bedtime.   estradiol 0.1 MG/GM vaginal cream Commonly known as:  ESTRACE Place 1 Applicatorful vaginally. 2 times a week. Monday and Friday   Fish Oil 1000 MG Caps Take 1 capsule by mouth at bedtime.   fluticasone 50 MCG/ACT nasal spray Commonly known as:  FLONASE Place 1 spray into the nose 2 (two) times daily. Reported on 11/10/2015   hydrALAZINE 25 MG tablet Commonly known as:  APRESOLINE Take 25 mg by mouth every 8 (eight) hours.   MULTIVITAMINS PO Take 1 tablet by mouth daily.   MYRBETRIQ  25 MG Tb24 tablet Generic drug:  mirabegron ER Take 25 mg by mouth daily.   olopatadine 0.1 % ophthalmic solution Commonly known as:  PATANOL Place 1 drop into both eyes 2 (two) times daily.   OXYGEN Inhale 2 L into the lungs continuous.   polyethylene glycol packet Commonly known as:  MIRALAX / GLYCOLAX Take 17 g by mouth daily.   senna 8.6 MG Tabs tablet Commonly known as:  SENOKOT Take 1 tablet (8.6 mg total) by mouth daily as needed for mild constipation.   sertraline 25 MG tablet Commonly known as:  ZOLOFT Take 37.5 mg by mouth daily.   SYSTANE BALANCE OP Place 1 drop into both eyes 2 (two) times daily.   Vitamin B-12 1000 MCG Subl Place 0.5 tablets under the tongue daily with breakfast.   Vitamin D3 2000 units Tabs Take 2,000 Units by mouth every evening. For vitamin D deficiency       Immunizations: Immunization History  Administered Date(s) Administered  . Influenza Split 06/16/2013  . Influenza-Unspecified 06/23/2014, 07/04/2015, 07/03/2016  . PPD Test 12/23/2013, 01/09/2014, 05/25/2015  . Pneumococcal Polysaccharide-23 09/16/2002  . Pneumococcal-Unspecified 09/15/2001     Physical Exam:  Vitals:   12/31/16 1416  BP: 133/75  Pulse: 81  Resp: 20  Temp: 98.9 F (37.2 C)  TempSrc: Oral  SpO2: 98%  Weight: 211 lb 8 oz (95.9 kg)  Height: 5\' 9"  (1.753 m)   Body mass index is 31.23 kg/m.   General- elderly obese female in no acute distress, chronically ill appearing.  HEENT- moist mucus membrane, no cervical lymphadenopathy. Cardiovascular- normal s1,s2, no murmur, trace leg edema Respiratory- bilateral equal air entry, no wheeze, no rhonchi, no crackles, no use of accessory muscles Abdomen- bowel sounds present, soft, non tender, suprapubic catheter in place and site clean Musculoskeletal- left hemiplegia with left hand contracted, left 4th and 5th finger contracted, has a splint in place, arthritis changes in digits, hoyer  transfer Neurological- alert and oriented to self Skin- stage 2 pressure ulcer left buttock   Labs reviewed: Basic Metabolic Panel:  Recent Labs  01/21/16 0428  02/27/16 1229  04/17/16 1200 04/18/16 0449 04/29/16 06/25/16  NA 143  < > 140  < > 139 138 143 140  K 3.2*  < > 4.2  < > 4.3 3.9 3.8 4.0  CL 107  < > 110  --  107 108  --   --   CO2 27  < > 22  --  28 24  --   --   GLUCOSE 115*  < > 107*  --  88 99  --   --  BUN 8  < > 8  < > 14 10 15 13   CREATININE 0.59  < > 0.42*  < > 0.70 0.59 0.7 0.7  CALCIUM 9.8  < > 9.5  --  9.8 9.2  --   --   MG 1.7  --   --   --   --   --   --   --   PHOS 3.0  --   --   --   --   --   --   --   < > = values in this interval not displayed. Liver Function Tests:  Recent Labs  01/21/16 0428 02/24/16 2055 03/28/16 04/17/16 1200 06/25/16  AST 14* 13* 10* 16 9*  ALT 12* 10* 7 10* 7  ALKPHOS 82 68 85 84 112  BILITOT 0.5 0.5  --  0.3  --   PROT 6.8 6.2*  --  7.1  --   ALBUMIN 3.6 3.1*  --  3.6  --    No results for input(s): LIPASE, AMYLASE in the last 8760 hours. No results for input(s): AMMONIA in the last 8760 hours. CBC:  Recent Labs  02/17/16 2239 02/24/16 2055  02/27/16 1229  04/17/16 1200 04/18/16 0449 04/29/16 06/25/16  WBC 8.6 5.9  < > 6.1  < > 8.0 7.2 6.2 5.8  NEUTROABS 5.7 3.6  --   --   --  5.6  --   --   --   HGB 11.6* 9.8*  < > 10.6*  < > 11.1* 10.9* 10.1* 10.2*  HCT 37.7 32.9*  < > 35.1*  < > 36.0 35.5* 34* 34*  MCV 86.3 86.4  < > 85.8  --  86.3 86.4  --   --   PLT 188 158  < > PLATELET CLUMPS NOTED ON SMEAR, UNABLE TO ESTIMATE  < > 189 190 191 184  < > = values in this interval not displayed.     Assessment/Plan  Allergic rhinitis No symptom at present. Per POA has been on flonase for several years and would like to continue this. Continue flonase nasal spray  Vaginal atrophy Continue estrace cream twice a week and monitor  Neurogenic bladder Has suprapubic catheter, monitor for signs of UTI. Catheter  care. Continue myrbetriq for bladder spasm  Hypertensive heart disease with heart failure Improved BP reading on review. Continue hydralazine 25 mg tid and monitor. Monitor BP and adjust dosing if needed.   Chronic depression Had decreased zoloft to 37.5 mg daily from 50 mg and is tolerating GDR well, monitor   Blanchie Serve, MD Internal Medicine [place],  [address] Cell Phone (Monday-Friday 8 am - 5 pm): [phone] On Call: [phone] and follow prompts after 5 pm and on weekends Office Phone: [phone] Office Fax: [phone]

## 2017-04-17 IMAGING — CR DG CHEST 2V
2 series · 2 of 2 positions shown · non-contrast
Comparison: Chest radiograph dated 02/17/2016

CLINICAL DATA: 82-year-old female with confusion.

EXAM:
CHEST  2 VIEW

[chest lat]
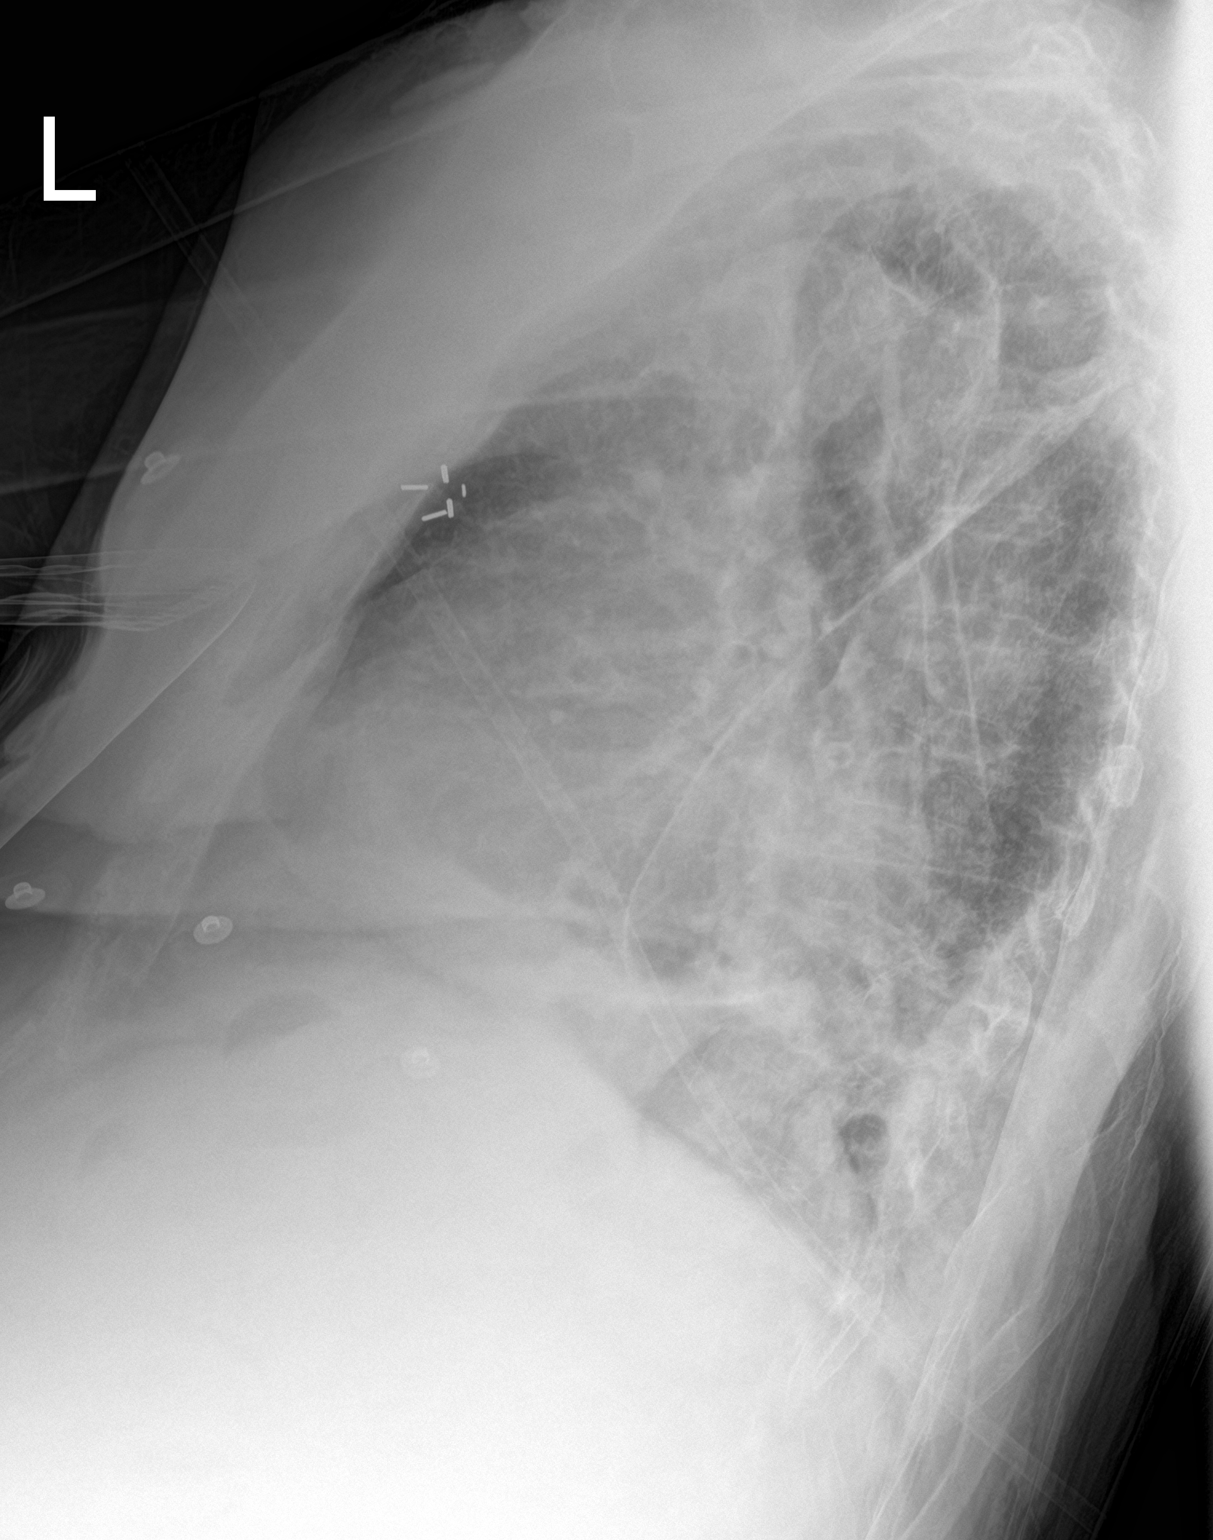

[chest ap]
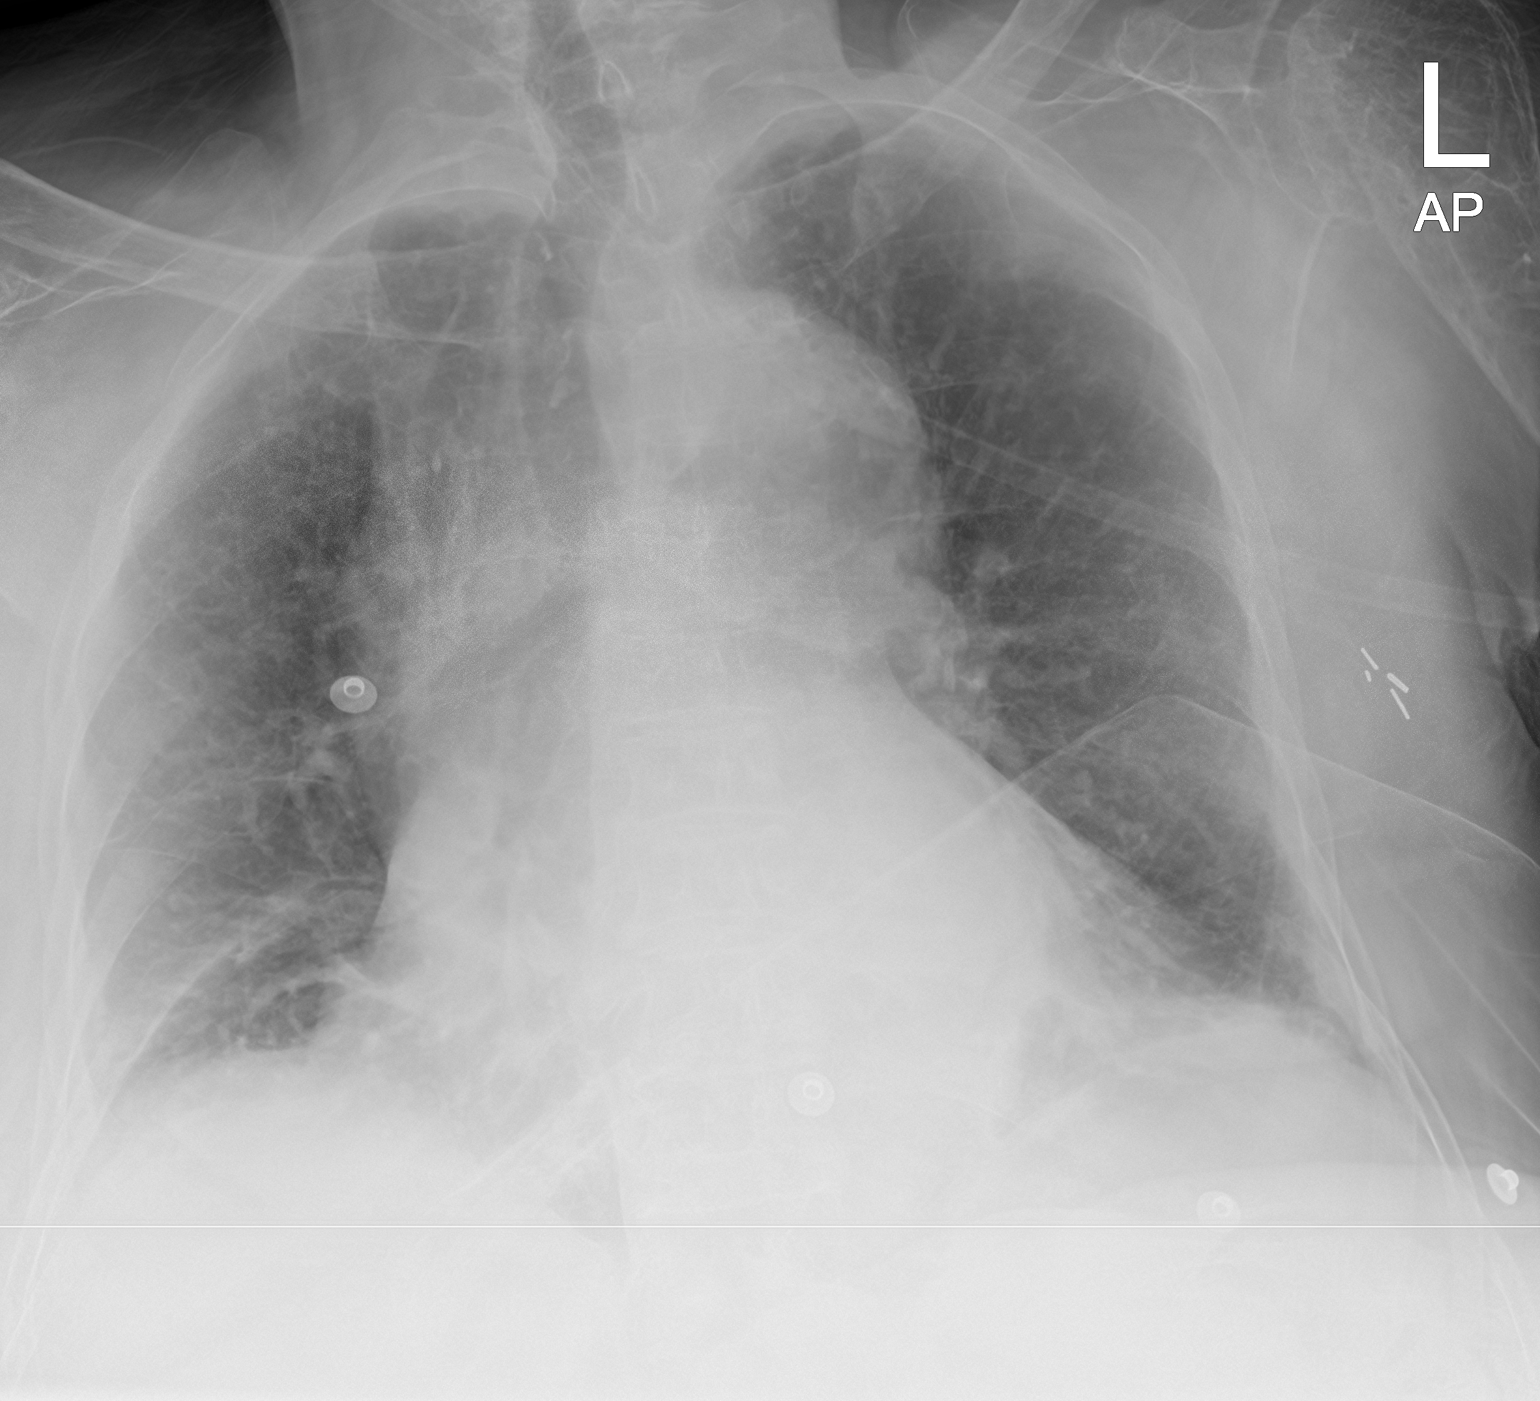

[2 of 2 positions shown; findings below may reference images not displayed]

FINDINGS: Two views of the chest demonstrate bibasilar atelectatic
changes/scarring versus less likely infiltrates. There is chronic
bronchiectatic changes. There is no pleural effusion or
pneumothorax. Stable top-normal cardiac size. The aorta is tortuous.
No acute osseous pathology.
IMPRESSION: Bibasilar atelectasis/ scarring versus less likely infiltrate.

## 2017-04-17 IMAGING — CT CT HEAD W/O CM
3 series · 16 of 47 positions shown, 19 images · non-contrast
Comparison: Head CT dated 12/21/2013

CLINICAL DATA: 89-year-old female with altered mental status.
History of diabetes and dementia.

EXAM:
CT HEAD WITHOUT CONTRAST
TECHNIQUE: Contiguous axial images were obtained from the base of the skull
through the vertex without intravenous contrast.

[Series 2: head 5.0 st · axial · 0.44mm/px · z∈[-135,+5]mm · 10 of 34 slices shown, 13 images]
[im 3/34  brain]
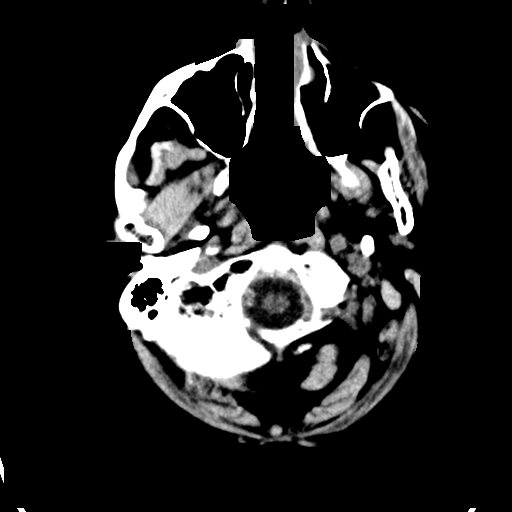
[im 3/34  bone]
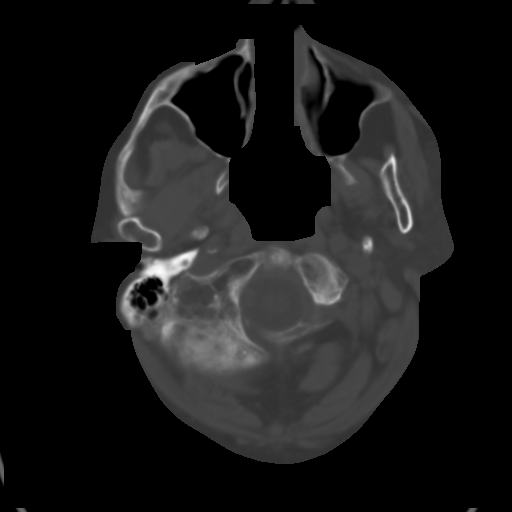
[im 6/34  brain]
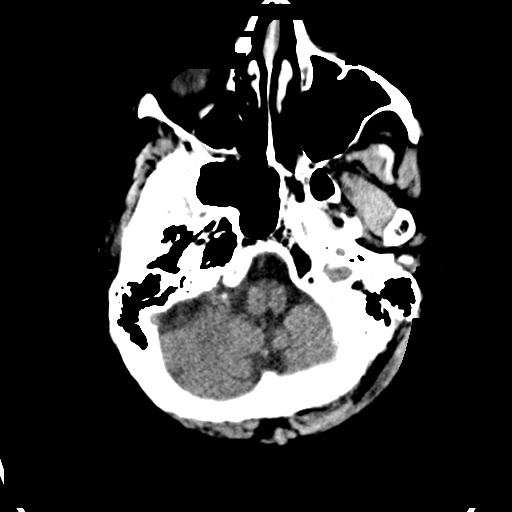
[im 10/34  brain]
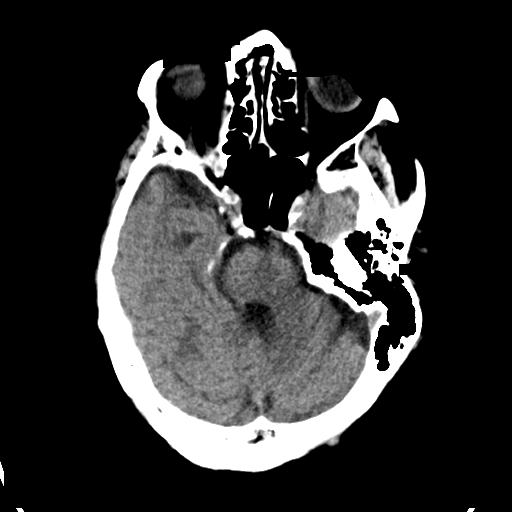
[im 12/34  brain]
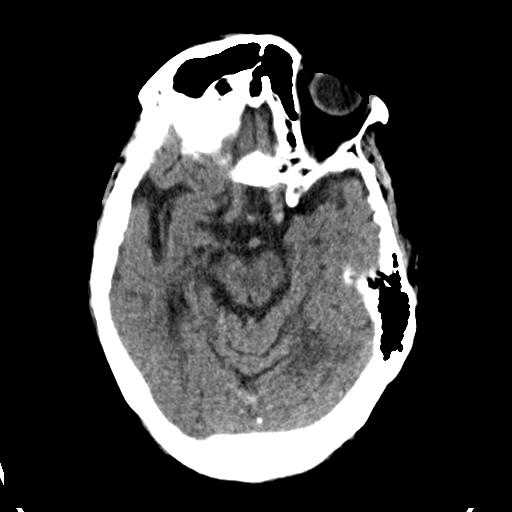
[im 15/34  brain]
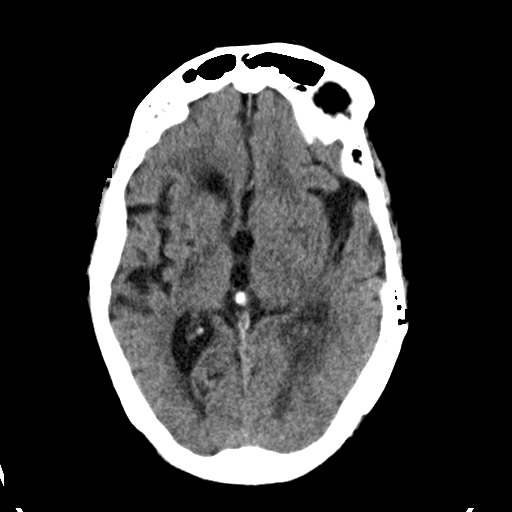
[im 15/34  bone]
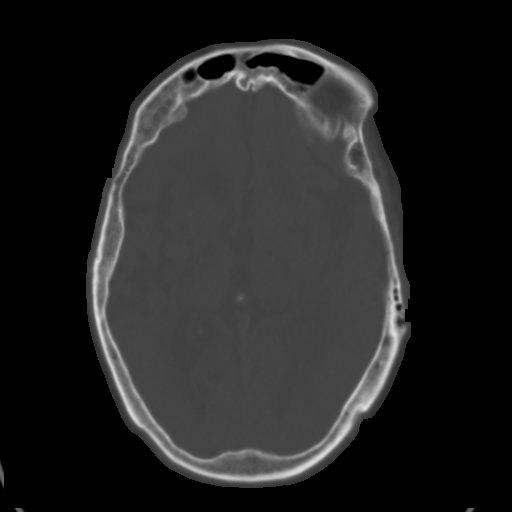
[im 19/34  brain]
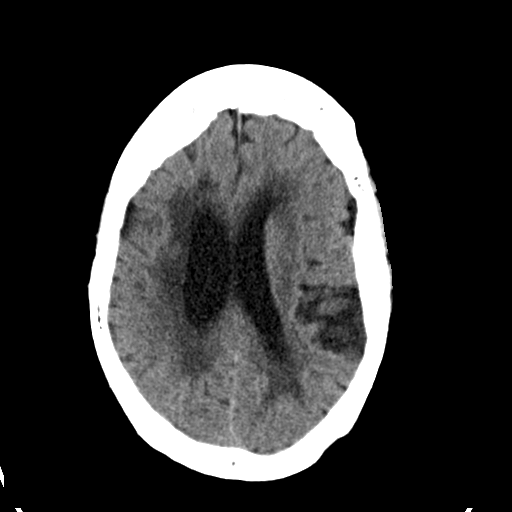
[im 22/34  brain]
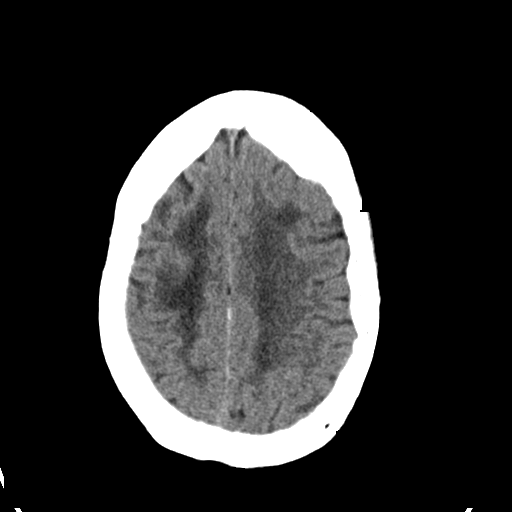
[im 26/34  brain]
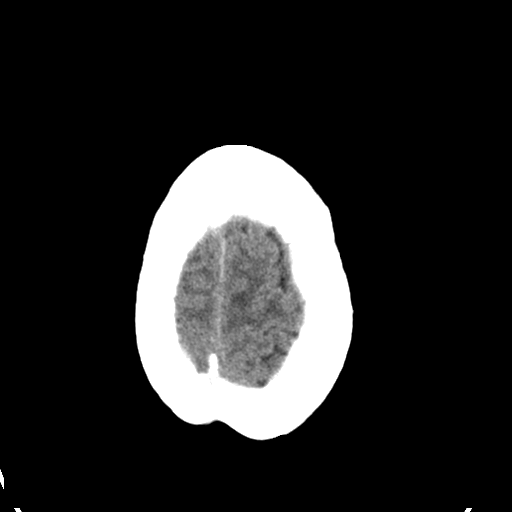
[im 28/34  brain]
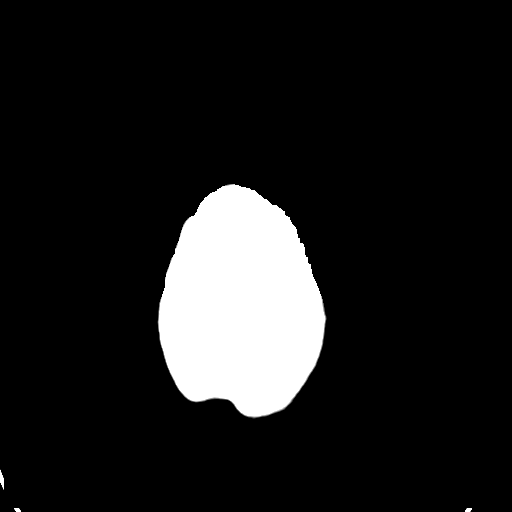
[im 28/34  bone]
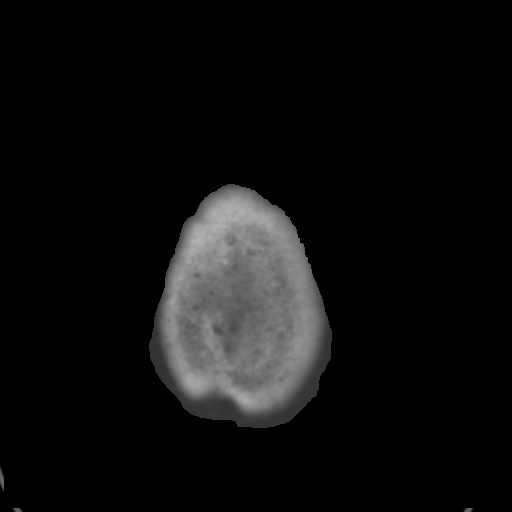
[im 31/34  brain]
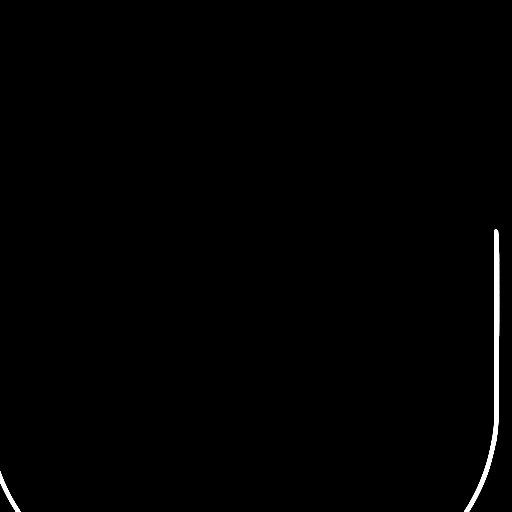

[Series 4: head 3.0 cor st · coronal · 0.31mm/px · 3 of 71 slices shown]
[im 24/71  brain]
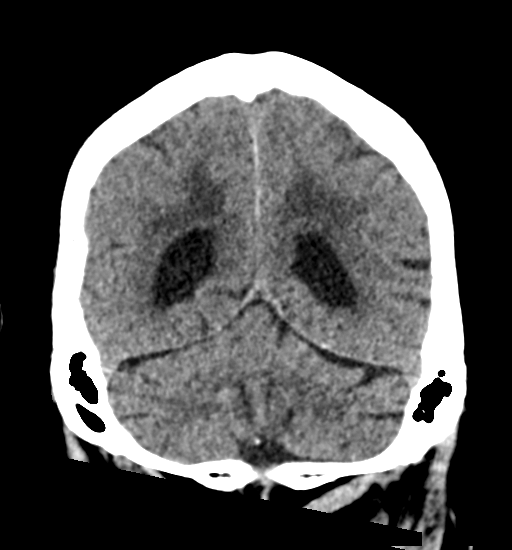
[im 32/71  brain]
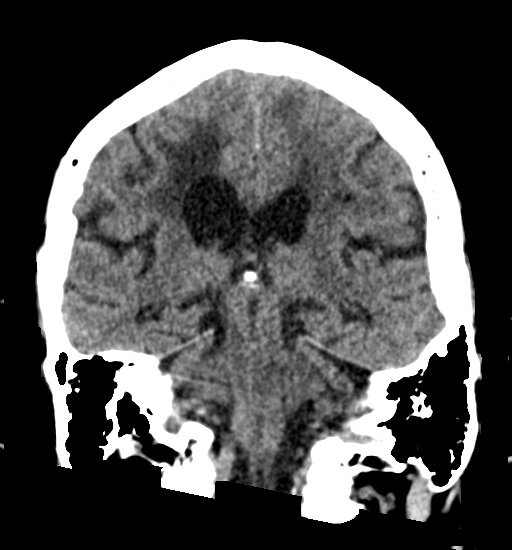
[im 39/71  brain]
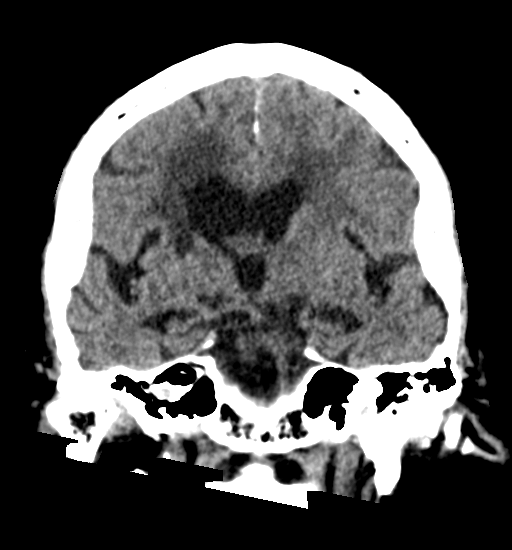

[Series 5: head 3.0 sag st · sagittal · 0.36mm/px · 3 of 53 slices shown]
[im 22/53  brain]
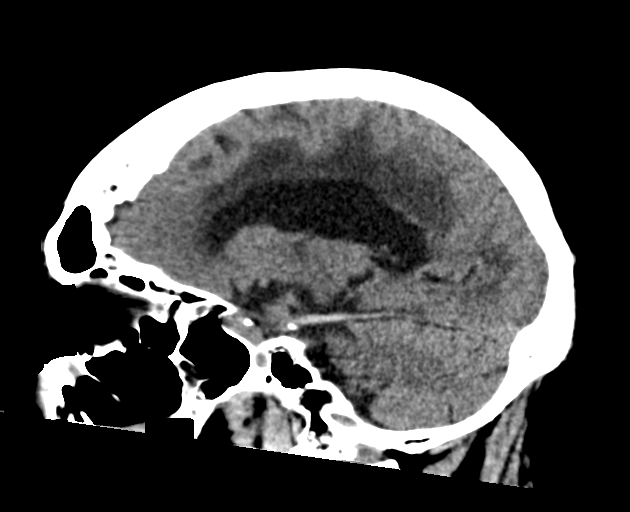
[im 27/53  brain]
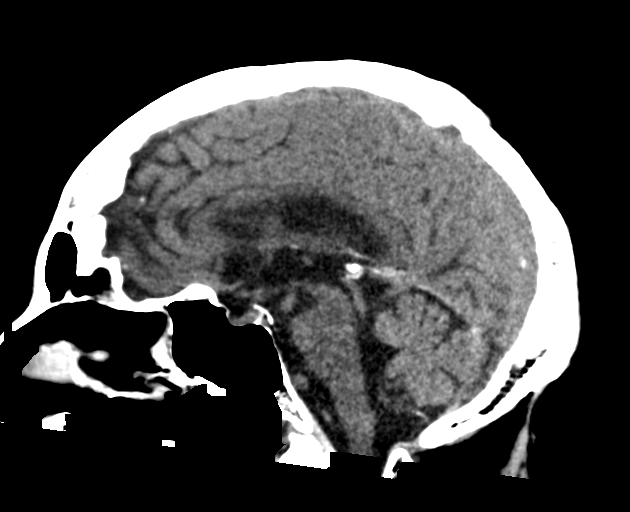
[im 31/53  brain]
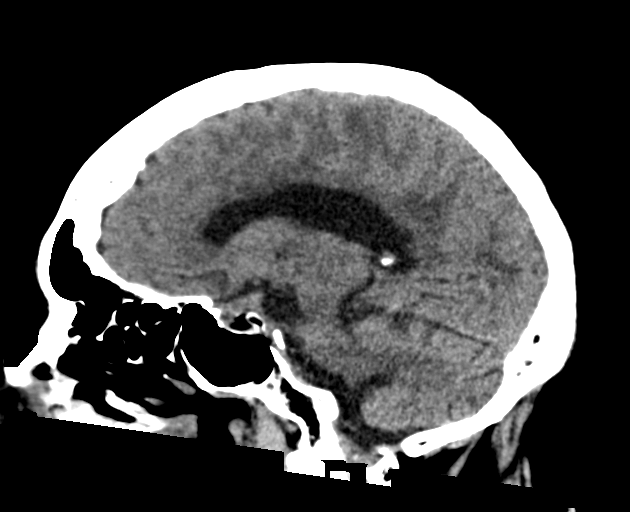

[16 of 47 positions shown; findings below may reference images not displayed]

FINDINGS: The ventricles are dilated and the sulci are prominent compatible
with age-related atrophy. Periventricular and deep white matter
hypodensities represent chronic microvascular ischemic changes.
There is no intracranial hemorrhage. No mass effect or midline shift
identified.

The visualized paranasal sinuses and mastoid air cells are well
aerated. The calvarium is intact. There is hyperostosis frontalis.
IMPRESSION: No acute intracranial hemorrhage.

Age-related atrophy and chronic microvascular ischemic disease.

If symptoms persist and there are no contraindications, MRI may
provide better evaluation if clinically indicated.

## 2017-04-21 IMAGING — CR DG ABD PORTABLE 1V
2 series · 2 of 2 positions shown · non-contrast
Comparison: CT abdomen and pelvis 01/20/2016

CLINICAL DATA: Abdominal pain and distension today, history COPD,
dementia, GERD, diabetes mellitus, CHF, chronic kidney disease,
hypertension

EXAM:
PORTABLE ABDOMEN - 1 VIEW

[AP (1 of 2)]
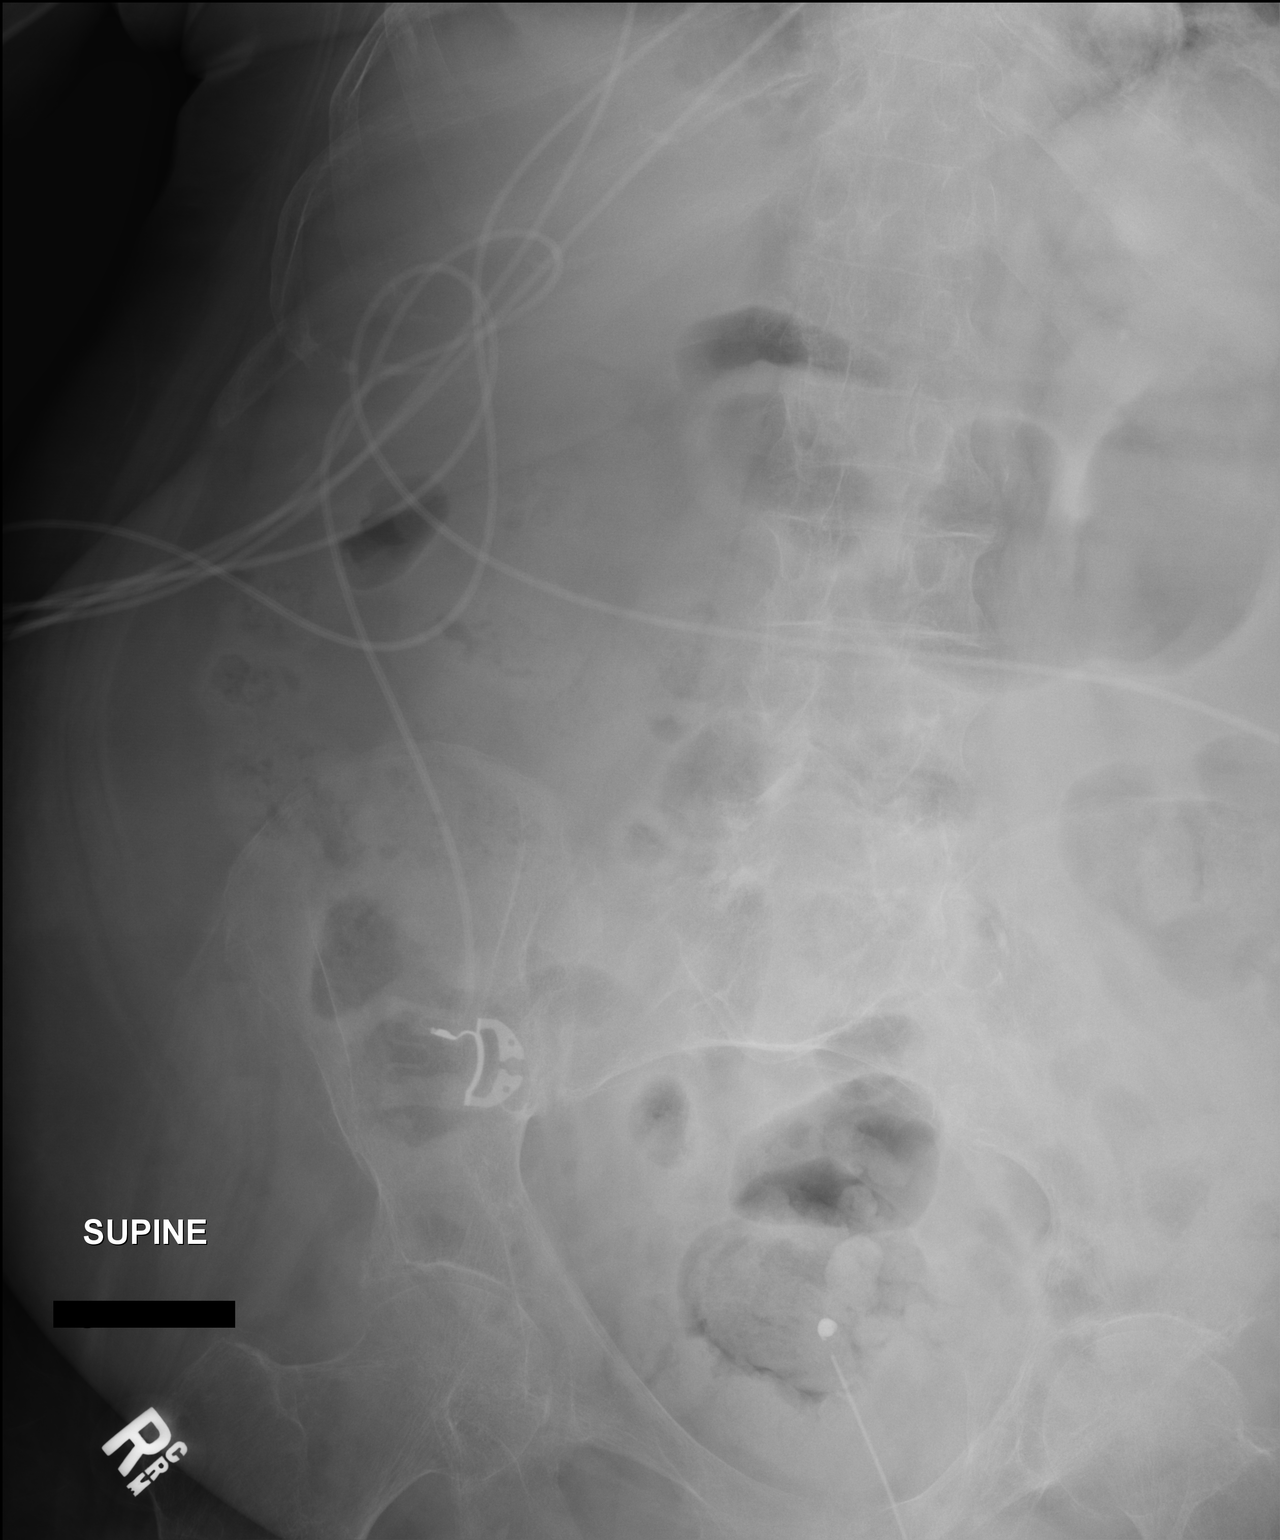

[AP (2 of 2)]
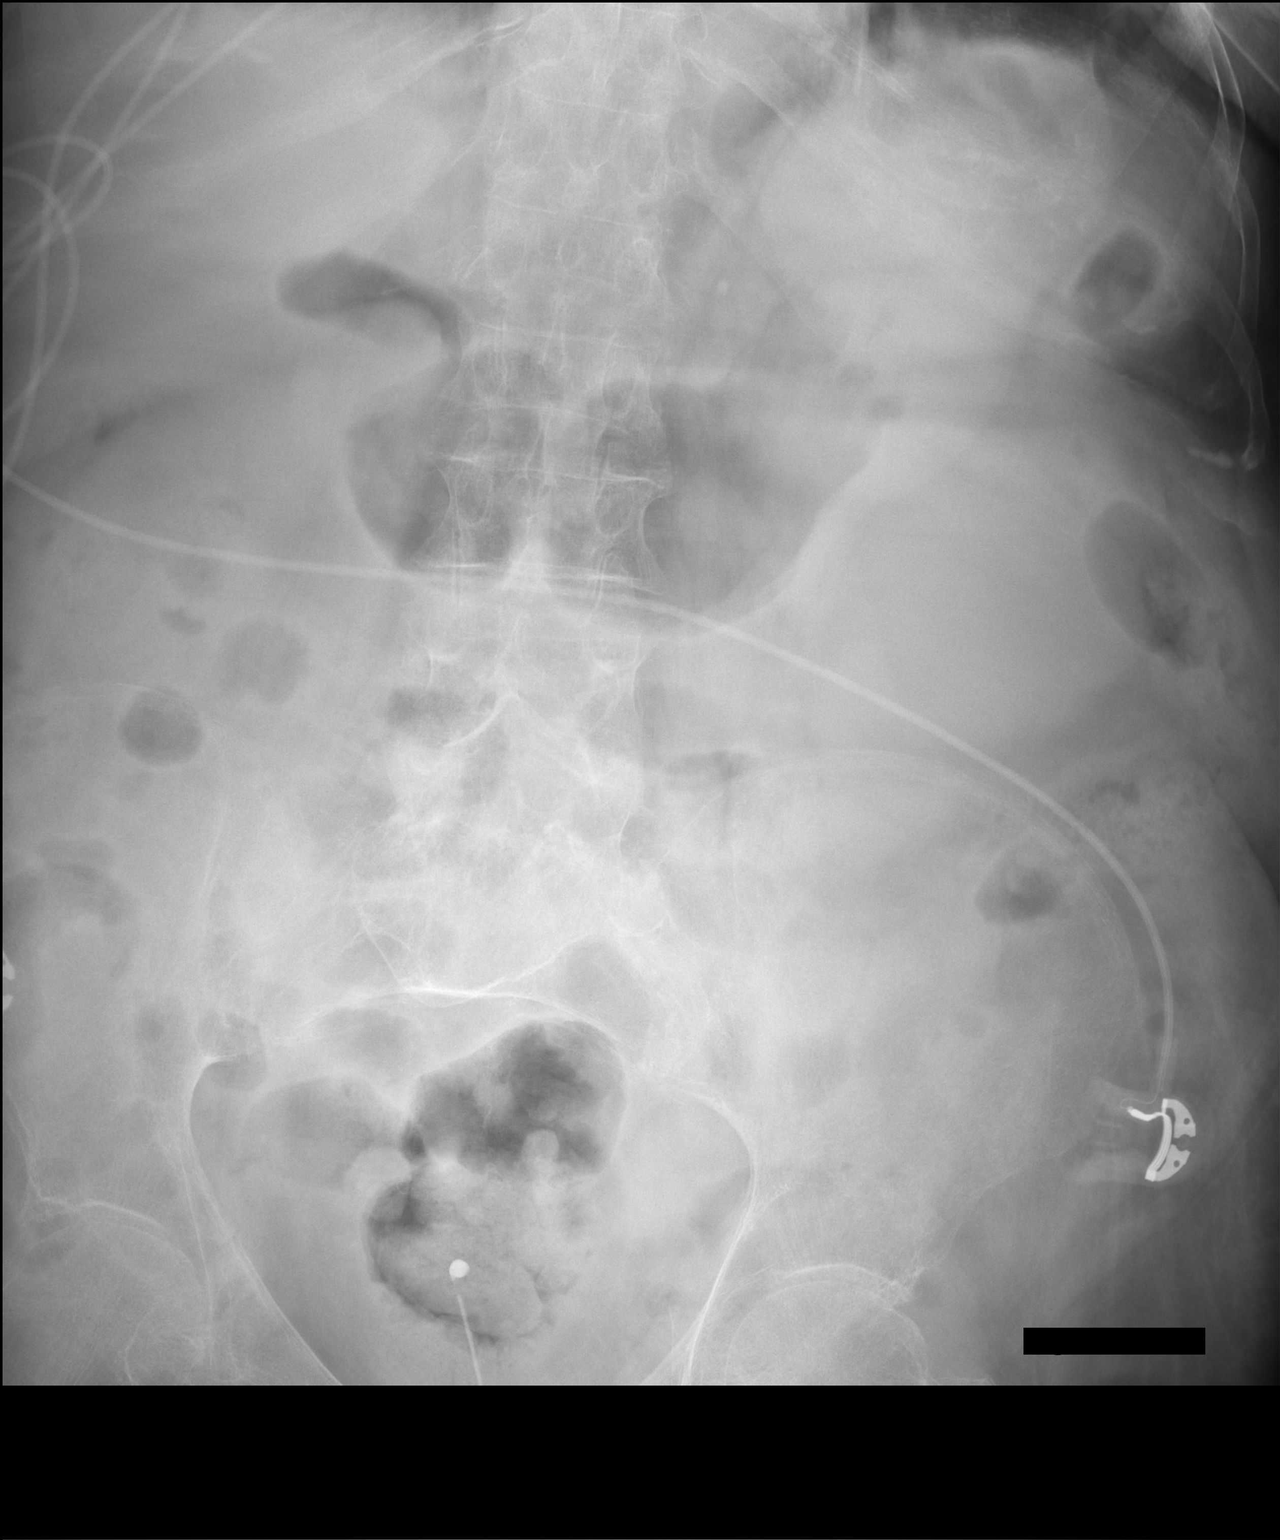

[2 of 2 positions shown; findings below may reference images not displayed]

FINDINGS: Catheter projects over pelvis/ bladder.

Nonobstructive bowel gas pattern.

No bowel wall thickening.

Stool present in rectum.

Bones demineralized.

No definite urinary tract calcification.
IMPRESSION: Nonobstructive bowel gas pattern.

## 2017-06-12 ENCOUNTER — Other Ambulatory Visit (HOSPITAL_COMMUNITY): Payer: Self-pay | Admitting: Internal Medicine

## 2017-06-12 DIAGNOSIS — R4789 Other speech disturbances: Secondary | ICD-10-CM

## 2017-06-12 DIAGNOSIS — H539 Unspecified visual disturbance: Secondary | ICD-10-CM

## 2017-06-18 ENCOUNTER — Emergency Department (HOSPITAL_COMMUNITY): Payer: Medicare Other

## 2017-06-18 ENCOUNTER — Ambulatory Visit (HOSPITAL_COMMUNITY)
Admission: RE | Admit: 2017-06-18 | Discharge: 2017-06-18 | Disposition: A | Discharge status: Home / Self Care | Payer: Medicare Other | Source: Ambulatory Visit | Attending: Internal Medicine | Admitting: Internal Medicine

## 2017-06-18 ENCOUNTER — Inpatient Hospital Stay (HOSPITAL_COMMUNITY): Payer: Medicare Other

## 2017-06-18 ENCOUNTER — Other Ambulatory Visit: Payer: Self-pay

## 2017-06-18 ENCOUNTER — Encounter (HOSPITAL_COMMUNITY): Payer: Self-pay

## 2017-06-18 ENCOUNTER — Inpatient Hospital Stay (HOSPITAL_COMMUNITY)
Admission: EM | Admit: 2017-06-18 | Discharge: 2017-06-21 | DRG: 064 | Disposition: A | Discharge status: Hospice | Payer: Medicare Other | Attending: Internal Medicine | Admitting: Internal Medicine

## 2017-06-18 DIAGNOSIS — Z79899 Other long term (current) drug therapy: Secondary | ICD-10-CM

## 2017-06-18 DIAGNOSIS — R4182 Altered mental status, unspecified: Secondary | ICD-10-CM | POA: Diagnosis present

## 2017-06-18 DIAGNOSIS — I63512 Cerebral infarction due to unspecified occlusion or stenosis of left middle cerebral artery: Secondary | ICD-10-CM | POA: Diagnosis present

## 2017-06-18 DIAGNOSIS — Z515 Encounter for palliative care: Secondary | ICD-10-CM | POA: Diagnosis not present

## 2017-06-18 DIAGNOSIS — F0150 Vascular dementia without behavioral disturbance: Secondary | ICD-10-CM | POA: Diagnosis present

## 2017-06-18 DIAGNOSIS — E1122 Type 2 diabetes mellitus with diabetic chronic kidney disease: Secondary | ICD-10-CM | POA: Diagnosis present

## 2017-06-18 DIAGNOSIS — Z853 Personal history of malignant neoplasm of breast: Secondary | ICD-10-CM

## 2017-06-18 DIAGNOSIS — Z9841 Cataract extraction status, right eye: Secondary | ICD-10-CM | POA: Diagnosis not present

## 2017-06-18 DIAGNOSIS — E785 Hyperlipidemia, unspecified: Secondary | ICD-10-CM | POA: Diagnosis present

## 2017-06-18 DIAGNOSIS — J9600 Acute respiratory failure, unspecified whether with hypoxia or hypercapnia: Secondary | ICD-10-CM | POA: Diagnosis present

## 2017-06-18 DIAGNOSIS — Z9049 Acquired absence of other specified parts of digestive tract: Secondary | ICD-10-CM

## 2017-06-18 DIAGNOSIS — Z885 Allergy status to narcotic agent status: Secondary | ICD-10-CM

## 2017-06-18 DIAGNOSIS — R404 Transient alteration of awareness: Secondary | ICD-10-CM

## 2017-06-18 DIAGNOSIS — I130 Hypertensive heart and chronic kidney disease with heart failure and stage 1 through stage 4 chronic kidney disease, or unspecified chronic kidney disease: Secondary | ICD-10-CM | POA: Diagnosis present

## 2017-06-18 DIAGNOSIS — I69954 Hemiplegia and hemiparesis following unspecified cerebrovascular disease affecting left non-dominant side: Secondary | ICD-10-CM

## 2017-06-18 DIAGNOSIS — J449 Chronic obstructive pulmonary disease, unspecified: Secondary | ICD-10-CM | POA: Diagnosis present

## 2017-06-18 DIAGNOSIS — Z9842 Cataract extraction status, left eye: Secondary | ICD-10-CM

## 2017-06-18 DIAGNOSIS — Z66 Do not resuscitate: Secondary | ICD-10-CM | POA: Diagnosis not present

## 2017-06-18 DIAGNOSIS — K219 Gastro-esophageal reflux disease without esophagitis: Secondary | ICD-10-CM | POA: Diagnosis present

## 2017-06-18 DIAGNOSIS — G9340 Encephalopathy, unspecified: Secondary | ICD-10-CM | POA: Diagnosis not present

## 2017-06-18 DIAGNOSIS — Z7982 Long term (current) use of aspirin: Secondary | ICD-10-CM

## 2017-06-18 DIAGNOSIS — Z91040 Latex allergy status: Secondary | ICD-10-CM

## 2017-06-18 DIAGNOSIS — H53149 Visual discomfort, unspecified: Secondary | ICD-10-CM | POA: Diagnosis present

## 2017-06-18 DIAGNOSIS — F209 Schizophrenia, unspecified: Secondary | ICD-10-CM | POA: Diagnosis present

## 2017-06-18 DIAGNOSIS — F329 Major depressive disorder, single episode, unspecified: Secondary | ICD-10-CM | POA: Diagnosis present

## 2017-06-18 DIAGNOSIS — I509 Heart failure, unspecified: Secondary | ICD-10-CM | POA: Diagnosis present

## 2017-06-18 DIAGNOSIS — Z87891 Personal history of nicotine dependence: Secondary | ICD-10-CM

## 2017-06-18 DIAGNOSIS — G92 Toxic encephalopathy: Secondary | ICD-10-CM | POA: Diagnosis present

## 2017-06-18 DIAGNOSIS — R4789 Other speech disturbances: Secondary | ICD-10-CM

## 2017-06-18 DIAGNOSIS — N189 Chronic kidney disease, unspecified: Secondary | ICD-10-CM | POA: Diagnosis present

## 2017-06-18 DIAGNOSIS — H409 Unspecified glaucoma: Secondary | ICD-10-CM | POA: Diagnosis present

## 2017-06-18 DIAGNOSIS — Z888 Allergy status to other drugs, medicaments and biological substances status: Secondary | ICD-10-CM

## 2017-06-18 DIAGNOSIS — Z4659 Encounter for fitting and adjustment of other gastrointestinal appliance and device: Secondary | ICD-10-CM

## 2017-06-18 DIAGNOSIS — M810 Age-related osteoporosis without current pathological fracture: Secondary | ICD-10-CM | POA: Diagnosis present

## 2017-06-18 DIAGNOSIS — R4189 Other symptoms and signs involving cognitive functions and awareness: Secondary | ICD-10-CM

## 2017-06-18 DIAGNOSIS — H539 Unspecified visual disturbance: Secondary | ICD-10-CM

## 2017-06-18 DIAGNOSIS — E669 Obesity, unspecified: Secondary | ICD-10-CM | POA: Diagnosis present

## 2017-06-18 DIAGNOSIS — J9601 Acute respiratory failure with hypoxia: Secondary | ICD-10-CM | POA: Diagnosis not present

## 2017-06-18 DIAGNOSIS — Z6831 Body mass index (BMI) 31.0-31.9, adult: Secondary | ICD-10-CM

## 2017-06-18 DIAGNOSIS — Z9911 Dependence on respirator [ventilator] status: Secondary | ICD-10-CM

## 2017-06-18 DIAGNOSIS — D649 Anemia, unspecified: Secondary | ICD-10-CM | POA: Diagnosis present

## 2017-06-18 DIAGNOSIS — Z09 Encounter for follow-up examination after completed treatment for conditions other than malignant neoplasm: Secondary | ICD-10-CM

## 2017-06-18 LAB — CBC WITH DIFFERENTIAL/PLATELET
BASOS ABS: 0 10*3/uL (ref 0.0–0.1)
BASOS ABS: 0 10*3/uL (ref 0.0–0.1)
BASOS PCT: 0 %
Basophils Relative: 0 %
EOS ABS: 0.1 10*3/uL (ref 0.0–0.7)
EOS ABS: 0 10*3/uL (ref 0.0–0.7)
EOS PCT: 1 %
Eosinophils Relative: 0 %
HCT: 40.8 % (ref 36.0–46.0)
HCT: 35.8 % — ABNORMAL LOW (ref 36.0–46.0)
HEMOGLOBIN: 10.9 g/dL — AB (ref 12.0–15.0)
Hemoglobin: 12.8 g/dL (ref 12.0–15.0)
LYMPHS ABS: 3.5 10*3/uL (ref 0.7–4.0)
LYMPHS ABS: 1.2 10*3/uL (ref 0.7–4.0)
LYMPHS PCT: 42 %
Lymphocytes Relative: 14 %
MCH: 26.4 pg (ref 26.0–34.0)
MCH: 25.8 pg — ABNORMAL LOW (ref 26.0–34.0)
MCHC: 31.4 g/dL (ref 30.0–36.0)
MCHC: 30.4 g/dL (ref 30.0–36.0)
MCV: 84.3 fL (ref 78.0–100.0)
MCV: 84.6 fL (ref 78.0–100.0)
Monocytes Absolute: 0.5 10*3/uL (ref 0.1–1.0)
Monocytes Absolute: 0.4 10*3/uL (ref 0.1–1.0)
Monocytes Relative: 6 %
Monocytes Relative: 5 %
NEUTROS PCT: 51 %
NEUTROS PCT: 81 %
Neutro Abs: 4.3 10*3/uL (ref 1.7–7.7)
Neutro Abs: 6.8 10*3/uL (ref 1.7–7.7)
PLATELETS: 159 10*3/uL (ref 150–400)
Platelets: 208 10*3/uL (ref 150–400)
RBC: 4.84 MIL/uL (ref 3.87–5.11)
RBC: 4.23 MIL/uL (ref 3.87–5.11)
RDW: 17.8 % — ABNORMAL HIGH (ref 11.5–15.5)
RDW: 18.2 % — ABNORMAL HIGH (ref 11.5–15.5)
WBC: 8.4 10*3/uL (ref 4.0–10.5)
WBC: 8.4 10*3/uL (ref 4.0–10.5)

## 2017-06-18 LAB — COMPREHENSIVE METABOLIC PANEL
ALT: 17 U/L (ref 14–54)
AST: 23 U/L (ref 15–41)
Albumin: 3.6 g/dL (ref 3.5–5.0)
Alkaline Phosphatase: 99 U/L (ref 38–126)
Anion gap: 9 (ref 5–15)
BILIRUBIN TOTAL: 0.5 mg/dL (ref 0.3–1.2)
BUN: 13 mg/dL (ref 6–20)
CO2: 23 mmol/L (ref 22–32)
CREATININE: 0.81 mg/dL (ref 0.44–1.00)
Calcium: 10.3 mg/dL (ref 8.9–10.3)
Chloride: 107 mmol/L (ref 101–111)
GFR calc Af Amer: >60 mL/min (ref >60)
Glucose, Bld: 151 mg/dL — ABNORMAL HIGH (ref 65–99)
Potassium: 3.7 mmol/L (ref 3.5–5.1)
Sodium: 139 mmol/L (ref 135–145)
TOTAL PROTEIN: 7 g/dL (ref 6.5–8.1)

## 2017-06-18 LAB — I-STAT CHEM 8, ED
BUN: 13 mg/dL (ref 6–20)
CREATININE: 0.7 mg/dL (ref 0.44–1.00)
Calcium, Ion: 1.32 mmol/L (ref 1.15–1.40)
Chloride: 107 mmol/L (ref 101–111)
Glucose, Bld: 149 mg/dL — ABNORMAL HIGH (ref 65–99)
HEMATOCRIT: 39 % (ref 36.0–46.0)
HEMOGLOBIN: 13.3 g/dL (ref 12.0–15.0)
Potassium: 3.7 mmol/L (ref 3.5–5.1)
Sodium: 141 mmol/L (ref 135–145)
TCO2: 25 mmol/L (ref 22–32)

## 2017-06-18 LAB — I-STAT TROPONIN, ED: TROPONIN I, POC: 0 ng/mL (ref 0.00–0.08)

## 2017-06-18 LAB — URINALYSIS, ROUTINE W REFLEX MICROSCOPIC
BILIRUBIN URINE: NEGATIVE
Glucose, UA: NEGATIVE mg/dL
Hgb urine dipstick: NEGATIVE
Ketones, ur: NEGATIVE mg/dL
Nitrite: NEGATIVE
PROTEIN: 100 mg/dL — AB
SPECIFIC GRAVITY, URINE: 1.011 (ref 1.005–1.030)
pH: 8 (ref 5.0–8.0)

## 2017-06-18 LAB — I-STAT ARTERIAL BLOOD GAS, ED
Acid-Base Excess: 1 mmol/L (ref 0.0–2.0)
BICARBONATE: 25.4 mmol/L (ref 20.0–28.0)
O2 Saturation: 100 %
PO2 ART: 376 mmHg — AB (ref 83.0–108.0)
TCO2: 27 mmol/L (ref 22–32)
pCO2 arterial: 40.8 mmHg (ref 32.0–48.0)
pH, Arterial: 7.402 (ref 7.350–7.450)

## 2017-06-18 LAB — CBG MONITORING, ED: Glucose-Capillary: 151 mg/dL — ABNORMAL HIGH (ref 65–99)

## 2017-06-18 LAB — I-STAT CG4 LACTIC ACID, ED: LACTIC ACID, VENOUS: 1.36 mmol/L (ref 0.5–1.9)

## 2017-06-18 MED ORDER — SUCCINYLCHOLINE CHLORIDE 20 MG/ML IJ SOLN
100.0000 mg | Freq: Once | INTRAMUSCULAR | Status: AC
Start: 2017-06-18 — End: 2017-06-18
  Administered 2017-06-18: 100 mg via INTRAVENOUS

## 2017-06-18 MED ORDER — SODIUM CHLORIDE 0.9 % IV BOLUS (SEPSIS)
500.0000 mL | Freq: Once | INTRAVENOUS | Status: AC
  Administered 2017-06-18: 500 mL via INTRAVENOUS

## 2017-06-18 MED ORDER — ASPIRIN 300 MG RE SUPP
300.0000 mg | RECTAL | Status: AC
  Administered 2017-06-18: 300 mg via RECTAL
  Filled 2017-06-18: qty 1

## 2017-06-18 MED ORDER — SODIUM CHLORIDE 0.9 % IV SOLN: 250.0000 mL | INTRAVENOUS | Status: DC | PRN

## 2017-06-18 MED ORDER — IPRATROPIUM-ALBUTEROL 0.5-2.5 (3) MG/3ML IN SOLN
3.0000 mL | Freq: Four times a day (QID) | RESPIRATORY_TRACT | Status: DC
  Administered 2017-06-18 – 2017-06-21 (×12): 3 mL via RESPIRATORY_TRACT
  Filled 2017-06-18 (×12): qty 3

## 2017-06-18 MED ORDER — ASPIRIN 81 MG PO CHEW: 243.0000 mg | CHEWABLE_TABLET | ORAL | Status: AC

## 2017-06-18 MED ORDER — HEPARIN SODIUM (PORCINE) 5000 UNIT/ML IJ SOLN
5000.0000 [IU] | Freq: Three times a day (TID) | INTRAMUSCULAR | Status: DC
  Administered 2017-06-18 – 2017-06-20 (×5): 5000 [IU] via SUBCUTANEOUS
  Filled 2017-06-18 (×6): qty 1

## 2017-06-18 MED ORDER — SODIUM CHLORIDE 0.9 % IV SOLN
INTRAVENOUS | Status: DC
  Administered 2017-06-19 – 2017-06-20 (×3): via INTRAVENOUS

## 2017-06-18 MED ORDER — FENTANYL 2500MCG IN NS 250ML (10MCG/ML) PREMIX INFUSION
25.0000 ug/h | INTRAVENOUS | Status: DC
  Administered 2017-06-18 – 2017-06-20 (×2): 50 ug/h via INTRAVENOUS
  Filled 2017-06-18 (×2): qty 250

## 2017-06-18 MED ORDER — DEXTROSE 5 % IV SOLN
1.0000 g | Freq: Once | INTRAVENOUS | Status: AC
  Administered 2017-06-18: 1 g via INTRAVENOUS
  Filled 2017-06-18: qty 10

## 2017-06-18 MED ORDER — ETOMIDATE 2 MG/ML IV SOLN
20.0000 mg | Freq: Once | INTRAVENOUS | Status: AC
  Administered 2017-06-18: 20 mg via INTRAVENOUS

## 2017-06-18 MED ORDER — PHENYLEPHRINE 40 MCG/ML (10ML) SYRINGE FOR IV PUSH (FOR BLOOD PRESSURE SUPPORT)
PREFILLED_SYRINGE | INTRAVENOUS | Status: AC
  Administered 2017-06-18: 40 ug
  Filled 2017-06-18: qty 10

## 2017-06-18 MED ORDER — SODIUM CHLORIDE 0.9 % IV BOLUS (SEPSIS)
1000.0000 mL | Freq: Once | INTRAVENOUS | Status: AC
  Administered 2017-06-18: 1000 mL via INTRAVENOUS

## 2017-06-18 MED ORDER — IOPAMIDOL (ISOVUE-370) INJECTION 76%
INTRAVENOUS | Status: AC
  Administered 2017-06-18: 50 mL
  Filled 2017-06-18: qty 50

## 2017-06-18 MED ORDER — FENTANYL BOLUS VIA INFUSION
25.0000 ug | INTRAVENOUS | Status: DC | PRN
  Administered 2017-06-18 – 2017-06-19 (×2): 25 ug via INTRAVENOUS
  Filled 2017-06-18: qty 25

## 2017-06-18 NOTE — H&P (Signed)
PULMONARY / CRITICAL CARE MEDICINE   Name: [name] MRN: [id number] DOB: [birth year]    ADMISSION DATE:  06/18/2017   CHIEF COMPLAINT: Sudden unresponsiveness, nausea and vomiting.  HISTORY OF PRESENT ILLNESS:   This is an 83 year old who appears to suffer from vascular dementia who over approximately the past week has been having repeated automatisms of the tongue and upper extremities. She was also complaining of some discharge from her eyes and photophobia. Visual acuity has been declining. MRI was scheduled for today to evaluate these complaints however after the patient was placed in the ambulance for transfer she became suddenly unresponsive and suffered from an episode of nausea and vomiting. She is now intubated and mechanically ventilated in the department of emergency medicine and is not responsive despite having received no sedation since intubation.  PAST MEDICAL HISTORY :  She  has a past medical history of Acute sinusitis; Anemia; Arthritis; Breast cancer ([place]); Cervical spondylosis; CHF (congestive heart failure) ([place]); Chronic kidney disease; Chronic schizophrenia ([place]); COPD (chronic obstructive pulmonary disease) ([place]); Dementia; Depressive disorder; DM (diabetes mellitus) ([place]); Foley catheter in place; GERD (gastroesophageal reflux disease); Glaucoma; Hyperlipidemia; Hypertension; On home oxygen therapy; Osteoporosis; Schizophrenia ([place]); Stenosis of lumbosacral spine; Stroke [facility]); and Wheelchair dependent.  PAST SURGICAL HISTORY: She  has a past surgical history that includes Appendectomy; Cataract extraction, bilateral; Cholecystectomy; Uterine suspension; Joint replacement; Breast surgery; and Insertion of suprapubic catheter (N/A, 04/29/2014).  Allergies  Allergen Reactions  . Other Other (See Comments)    PER DAUGHTER, MOST PAIN MEDICATIONS "KNOCK OUT THE PATIENT" FOR SEVERAL DAYS  PT ACCEPTS NO BLOOD PRODUCTS  . Latex Other (See Comments)    "BURNS THE SKIN"  .  Lisinopril Other (See Comments)    Weakness in the legs  . Ultram [Tramadol] Other (See Comments)    VERTIGO FOR DAYS  . Codeine Nausea And Vomiting and Rash    And "zonked her out" (per daughter)    No current facility-administered medications on file prior to encounter.    Current Outpatient Prescriptions on File Prior to Encounter  Medication Sig  . acetaminophen (TYLENOL) 500 MG tablet Take 500 mg by mouth every 8 (eight) hours as needed for mild pain or fever.   Marland Kitchen albuterol (PROVENTIL) (2.5 MG/3ML) 0.083% nebulizer solution Take 2.5 mg by nebulization 3 (three) times daily.  Marland Kitchen aspirin 81 MG chewable tablet Chew 1 tablet (81 mg total) by mouth daily.  . budesonide (PULMICORT) 0.5 MG/2ML nebulizer solution Take 0.5 mg by nebulization 2 (two) times daily.  . Cholecalciferol (VITAMIN D3) 2000 UNITS TABS Take 2,000 Units by mouth every evening. For vitamin D deficiency   . Cyanocobalamin (VITAMIN B-12) 1000 MCG SUBL Place 0.5 tablets under the tongue daily with breakfast.  . donepezil (ARICEPT) 10 MG tablet Take 10 mg by mouth at bedtime.   Marland Kitchen estradiol (ESTRACE) 0.1 MG/GM vaginal cream Place 1 Applicatorful vaginally. 2 times a week. Monday and Friday  . fluticasone (FLONASE) 50 MCG/ACT nasal spray Place 1 spray into the nose 2 (two) times daily. Reported on 11/10/2015  . hydrALAZINE (APRESOLINE) 25 MG tablet Take 25 mg by mouth every 8 (eight) hours.   . mirabegron ER (MYRBETRIQ) 25 MG TB24 tablet Take 25 mg by mouth daily.   . Multiple Vitamin (MULTIVITAMINS PO) Take 1 tablet by mouth daily.  Marland Kitchen olopatadine (PATANOL) 0.1 % ophthalmic solution Place 1 drop into both eyes 2 (two) times daily.   . Omega-3 Fatty Acids (FISH OIL) 1000 MG CAPS  Take 1 capsule by mouth at bedtime.  . OXYGEN Inhale 2 L into the lungs continuous.   . polyethylene glycol (MIRALAX / GLYCOLAX) packet Take 17 g by mouth daily.  Marland Kitchen Propylene Glycol (SYSTANE BALANCE OP) Place 1 drop into both eyes 2 (two) times daily.    Marland Kitchen senna (SENOKOT) 8.6 MG TABS tablet Take 1 tablet (8.6 mg total) by mouth daily as needed for mild constipation.  . sertraline (ZOLOFT) 25 MG tablet Take 37.5 mg by mouth daily.    FAMILY HISTORY:  Her indicated that the status of her sister is unknown. She indicated that both of her daughters are alive.    SOCIAL HISTORY: She  reports that she quit smoking about 19 years ago. Her smoking use included Cigarettes. She has a 15.00 pack-year smoking history. She has never used smokeless tobacco. She reports that she does not drink alcohol or use drugs.  REVIEW OF SYSTEMS:   Contributes that she has no known arrythmias or syncopal episodes in the past  SUBJECTIVE:  As above  VITAL SIGNS: BP 114/69   Pulse 63   Temp (!) 97.5 F (36.4 C) (Rectal)   Resp 14   Ht 5\' 6"  (1.676 m)   SpO2 97%   HEMODYNAMICS:    VENTILATOR SETTINGS: Vent Mode: PRVC FiO2 (%):  [100 %] 100 % Set Rate:  [14 bmp] 14 bmp Vt Set:  [470 mL] 470 mL PEEP:  [5 cmH20] 5 cmH20  INTAKE / OUTPUT: No intake/output data recorded.  PHYSICAL EXAMINATION: General:  Obese elderly female, orally intubated, mechanically ventilated, and unresponsive Neuro:  No response to voice. Grimace to sternal rub. Pupils 16mm. Corneals intacts. Unable to perform doll's. No limb movement to sternal rub HEENT: no sclera injection or purulence Cardiovascular: RRR, distant S1 and S2 without m g or rub   Limbs warm with trace LE edema Lungs:  Symmetric air movement, few scattered rhonchi, no wheezes Abdomen:  Obese, soft, non-tender, no organomegaly or masses. Quiet Musculoskeletal:  ? Anterior displacement R sholder. LUE contracted  LABS:  BMET  Recent Labs Lab 06/18/17 1153 06/18/17 1211  NA 139 141  K 3.7 3.7  CL 107 107  CO2 23  --   BUN 13 13  CREATININE 0.81 0.70  GLUCOSE 151* 149*    Electrolytes  Recent Labs Lab 06/18/17 1153  CALCIUM 10.3    CBC  Recent Labs Lab 06/18/17 1153 06/18/17 1211  WBC  8.4  --   HGB 12.8 13.3  HCT 40.8 39.0  PLT 208  --     Coag's No results for input(s): APTT, INR in the last 168 hours.  Sepsis Markers  Recent Labs Lab 06/18/17 1211  LATICACIDVEN 1.36    ABG  Recent Labs Lab 06/18/17 1430  PHART 7.402  PCO2ART 40.8  PO2ART 376.0*    Liver Enzymes  Recent Labs Lab 06/18/17 1153  AST 23  ALT 17  ALKPHOS 99  BILITOT 0.5  ALBUMIN 3.6    Cardiac Enzymes No results for input(s): TROPONINI, PROBNP in the last 168 hours.  Glucose  Recent Labs Lab 06/18/17 1149  GLUCAP 151*    Imaging Ct Head Wo Contrast  Result Date: 06/18/2017 CLINICAL DATA:  Altered mental status. EXAM: CT HEAD WITHOUT CONTRAST TECHNIQUE: Contiguous axial images were obtained from the base of the skull through the vertex without intravenous contrast. COMPARISON:  11/17/2016 FINDINGS: Brain: Moderate left occipital patchy low-density with neutral mass effect that has occurred since prior and has  a subacute appearance. There is extensive chronic small vessel ischemic change with remote right basal ganglia infarction. White matter gliosis is confluent. There is asymmetric low-density in the right cerebral white matter, where there is also greater volume loss, stable from prior and consistent with asymmetric ischemic injury. Vascular: Atherosclerotic calcification.  No hyperdense vessel. Skull: Stable benign-appearing lucencies in the calvarium. Osteopenia. Sinuses/Orbits: No acute finding.  Bilateral cataract resection. IMPRESSION: 1. Moderate left occipital infarct with a subacute appearance. 2. Extensive chronic small vessel ischemia with remote perforator infarct in the right basal ganglia. Ischemic gliosis more severely affects the right cerebral white matter. Electronically Signed   By: Monte Fantasia M.D.   On: 06/18/2017 13:32   Dg Chest Portable 1 View  Result Date: 06/18/2017 CLINICAL DATA:  Respiratory failure, COPD, former smoker. Mental status changes.  EXAM: PORTABLE CHEST 1 VIEW COMPARISON:  Chest x-ray of April 17, 2016 FINDINGS: The lungs are mildly hypoinflated. There is no focal infiltrate. The interstitial markings are mildly prominent though stable. The cardiac silhouette is enlarged. The pulmonary vascularity is not engorged. There is calcification in the wall of the thoracic aorta. The endotracheal tube tip lies 3.3 cm above the carina. The esophagogastric tube tip and proximal port project below the inferior margin of the image. IMPRESSION: Mild hypoinflation. No acute pneumonia. Cardiomegaly without pulmonary edema. The support tubes are in reasonable position. Thoracic aortic atherosclerosis. Electronically Signed   By: David  Martinique M.D.   On: 06/18/2017 12:25     STUDIES:  CTA pending   DISCUSSION: Sudden decrease in mental status in a patient with vascular dementia. Her primary concern is a posterior circulation event not noted on CT. Alternately I am concerned by the possibility of seizures, over she is not showing any postictal resolution. Both a CTA and MRI have been ordered as well as an EEG.  ASSESSMENT / PLAN:  PULMONARY A: COPD by history P:   Ental status currently precludes weaning from mechanical ventilation. I started her on DuoNeb's  CARDIOVASCULAR A:  EKG does not show any acute ischemia to suggest that the sudden alteration in mental status was due to an ischemic event with hypoperfusion.   RENAL A:   History of CK D as listed in the chart, I have ordered a bolus of normal saline prior to CTA as a renal protective effort.   NEUROLOGIC A:   As above   FAMILY  - Updates:they were at bedside during my exam and I explained the plan of care    Lars Masson, MD [facility] [place] Pager: [phone]  06/18/2017, 4:45 PM   Greater than 35 minutes has been spent in the care of this patient today

## 2017-06-18 NOTE — ED Triage Notes (Signed)
Per IR, pt was there for MRI, became unresponsive. Hx of stroke. Pt vomited on herself twice. Family requesting intubation. Pt contracted on L side from previous stroke.

## 2017-06-18 NOTE — ED Notes (Signed)
30ml of phenylephrine given

## 2017-06-18 NOTE — Progress Notes (Signed)
Found pt on 40% fio2, pt tol well, no distress noted.  Sat 98%

## 2017-06-18 NOTE — Progress Notes (Signed)
Transported pt to CT on vent no events to report

## 2017-06-18 NOTE — ED Notes (Signed)
20 etomidate given

## 2017-06-18 NOTE — ED Provider Notes (Signed)
[facility] DEPT Provider Note   CSN: [id number] Arrival date & time: 06/18/17  1147     History   Chief Complaint No chief complaint on file.   HPI [name] is a 83 y.o. female.  Patient is 83 year old female with a history of CHF, chronic kidney disease, schizophrenia, dementia, prior stroke with left-sided hemiparesis who presents with altered mental status. She has had a decline in her function over the last month. She also has had some vision changes over the last month per family. For this reason she was scheduled to get an MRI of her brain today. Similar between transport from the nursing home to the MRI area she became unresponsive. She had an episode of vomiting as well. She was brought here to the emergency room for these symptoms. Family denies any other recent illnesses.History is limited due to patient's unresponsive state.      Past Medical History:  Diagnosis Date  . Acute sinusitis   . Anemia   . Arthritis   . Breast cancer ([place])   . Cervical spondylosis   . CHF (congestive heart failure) (HCC)    chronic and stable per MD note  . Chronic kidney disease    chronic kidney disease per MD notes  . Chronic schizophrenia ([place])   . COPD (chronic obstructive pulmonary disease) ([place])   . Dementia   . Depressive disorder   . DM (diabetes mellitus) ([place])    DIET CONTROLLED  . Foley catheter in place   . GERD (gastroesophageal reflux disease)   . Glaucoma   . Hyperlipidemia   . Hypertension   . On home oxygen therapy    2 L PER NASAL CANNULA  . Osteoporosis   . Schizophrenia ([place])   . Stenosis of lumbosacral spine   . Stroke (HCC)    L hemiparesis  . Wheelchair dependent     Patient Active Problem List   Diagnosis Date Noted  . Senile (atrophic) vaginitis 12/31/2016  . Late effect of cerebrovascular accident (CVA) 11/25/2016  . Hypertensive heart disease with heart failure ([place]) 11/25/2016  . Contracture of hand joint 04/22/2016  . Stage II  pressure ulcer of left buttock 04/18/2016  . Complicated UTI (urinary tract infection) 04/17/2016  . Acute encephalopathy 04/17/2016  . Diabetes mellitus type 2 in obese ([name]) 03/27/2016  . History of CVA with residual deficit 03/01/2016  . Chronic constipation 03/01/2016  . Major depression, chronic 03/01/2016  . Anemia, chronic disease 03/01/2016  . ESBL (extended spectrum beta-lactamase) producing bacteria infection 02/29/2016  . Altered mental status 02/25/2016  . Encephalopathy acute 02/25/2016  . Urinary tract infection 02/25/2016  . Type 2 diabetes mellitus with diabetic neuropathy, without long-term current use of insulin ([place]) 01/10/2016  . Allergic rhinitis due to allergen 01/10/2016  . Allergic conjunctivitis and rhinitis 12/07/2015  . Essential hypertension, benign 11/10/2015  . Constipation, chronic 11/10/2015  . Hyperlipidemia 04/15/2015  . B12 deficiency 04/15/2015  . Depression due to dementia 03/02/2015  . Dysphagia 01/23/2015  . Neurogenic dysfunction of the urinary bladder 01/09/2015  . Major depressive disorder, recurrent, in remission ([place]) 01/09/2015  . Pain, neuropathic 01/09/2015  . Hypertensive heart and renal disease with congestive heart failure ([place]) 01/09/2015  . Chronic systolic heart failure ([place]) 01/09/2015  . Type 2 diabetes mellitus with polyneuropathy ([place]) 01/09/2015  . Chronic respiratory failure with hypoxia ([place]) 10/14/2014  . CKD stage 2 due to type 2 diabetes mellitus ([place]) 10/14/2014  . Neuropathic pain 09/15/2014  . Senile  osteoporosis 09/15/2014  . Primary generalized (osteo)arthritis 09/15/2014  . Neurogenic bladder as late effect of cerebrovascular accident (CVA) 06/13/2014  . Hemiplegia of nondominant side following CVA (cerebrovascular accident) 06/13/2014  . Chronic respiratory failure, unspecified whether with hypoxia or hypercapnia ([place]) 06/13/2014  . Osteoporosis, unspecified 06/13/2014  . Anemia of chronic disease 06/13/2014  .  Vascular dementia 04/14/2014  . Schizophrenia, chronic condition ([place]) 04/14/2014  . Benign hypertensive heart and kidney disease with heart failure and with chronic kidney disease stage I through stage IV, or unspecified(404.11) 04/14/2014  . Type II or unspecified type diabetes mellitus with neurological manifestations, not stated as uncontrolled(250.60) 04/14/2014  . Urinary retention with incomplete bladder emptying 04/12/2014  . Unspecified vitamin D deficiency 02/28/2014  . Hemiplegia affecting nondominant side, late effect of cerebrovascular disease 02/28/2014  . Polyneuropathy in diabetes(357.2) 02/28/2014  . Esophageal reflux 02/28/2014  . Peripheral angiopathy in diseases classified elsewhere ([place]) 02/28/2014  . Anemia   . Chronic schizophrenia ([place])   . Mixed Alzheimer's and vascular dementia   . Depressive disorder   . Stroke ([place])   . COPD (chronic obstructive pulmonary disease) ([place]) 05/13/2012    Past Surgical History:  Procedure Laterality Date  . APPENDECTOMY    . BREAST SURGERY     breast cancer  . CATARACT EXTRACTION, BILATERAL    . CHOLECYSTECTOMY    . INSERTION OF SUPRAPUBIC CATHETER N/A 04/29/2014   Procedure: INSERTION OF SUPRAPUBIC CATHETER;  Surgeon: Arvil Persons, MD;  Location: [facility];  Service: Urology;  Laterality: N/A;  . JOINT REPLACEMENT     L TOTAL KNEE  . UTERINE SUSPENSION      OB History    No data available       Home Medications    Prior to Admission medications   Medication Sig Start Date End Date Taking? Authorizing Provider  acetaminophen (TYLENOL) 500 MG tablet Take 500 mg by mouth every 8 (eight) hours as needed for mild pain or fever.     [provider]  albuterol (PROVENTIL) (2.5 MG/3ML) 0.083% nebulizer solution Take 2.5 mg by nebulization 3 (three) times daily.    [provider]  aspirin 81 MG chewable tablet Chew 1 tablet (81 mg total) by mouth daily. 02/29/16   Dhungel, Nishant, MD  budesonide (PULMICORT)  0.5 MG/2ML nebulizer solution Take 0.5 mg by nebulization 2 (two) times daily.    [provider]  Cholecalciferol (VITAMIN D3) 2000 UNITS TABS Take 2,000 Units by mouth every evening. For vitamin D deficiency     [provider]  Cyanocobalamin (VITAMIN B-12) 1000 MCG SUBL Place 0.5 tablets under the tongue daily with breakfast.    [provider]  donepezil (ARICEPT) 10 MG tablet Take 10 mg by mouth at bedtime.     [provider]  estradiol (ESTRACE) 0.1 MG/GM vaginal cream Place 1 Applicatorful vaginally. 2 times a week. Monday and Friday    [provider]  fluticasone (FLONASE) 50 MCG/ACT nasal spray Place 1 spray into the nose 2 (two) times daily. Reported on 11/10/2015    [provider]  hydrALAZINE (APRESOLINE) 25 MG tablet Take 25 mg by mouth every 8 (eight) hours.     [provider]  mirabegron ER (MYRBETRIQ) 25 MG TB24 tablet Take 25 mg by mouth daily.     [provider]  Multiple Vitamin (MULTIVITAMINS PO) Take 1 tablet by mouth daily.    [provider]  olopatadine (PATANOL) 0.1 % ophthalmic solution Place 1  drop into both eyes 2 (two) times daily.     [provider]  Omega-3 Fatty Acids (FISH OIL) 1000 MG CAPS Take 1 capsule by mouth at bedtime.    [provider]  OXYGEN Inhale 2 L into the lungs continuous.     [provider]  polyethylene glycol (MIRALAX / GLYCOLAX) packet Take 17 g by mouth daily. 02/29/16   Dhungel, Nishant, MD  Propylene Glycol (SYSTANE BALANCE OP) Place 1 drop into both eyes 2 (two) times daily.     [provider]  senna (SENOKOT) 8.6 MG TABS tablet Take 1 tablet (8.6 mg total) by mouth daily as needed for mild constipation. 02/29/16   Dhungel, Flonnie Overman, MD  sertraline (ZOLOFT) 25 MG tablet Take 37.5 mg by mouth daily.    [provider]    Family History Family History  Problem Relation Age of Onset  . Asthma Daughter   . Heart  disease Sister        3 sisters  . Heart disease Brother        x 2  . Cancer Brother        multiple myloma    Social History Social History  Substance Use Topics  . Smoking status: Former Smoker    Packs/day: 1.00    Years: 15.00    Types: Cigarettes    Quit date: 09/16/1997  . Smokeless tobacco: Never Used  . Alcohol use No     Allergies   Codeine; Lisinopril; and Other   Review of Systems Review of Systems  Unable to perform ROS: Dementia     Physical Exam Updated Vital Signs BP 124/69   Pulse (!) 58   Temp (!) 97.5 F (36.4 C) (Rectal)   Resp 14   Ht 5\' 6"  (1.676 m)   SpO2 100%   Physical Exam  Constitutional: She appears well-developed and well-nourished.  Patient has emesis on her clothes  HENT:  Head: Normocephalic and atraumatic.  Eyes: Pupils are equal, round, and reactive to light.  Neck: Normal range of motion. Neck supple.  Cardiovascular: Normal rate, regular rhythm and normal heart sounds.   Pulmonary/Chest: Effort normal and breath sounds normal. No respiratory distress. She has no wheezes. She has no rales. She exhibits no tenderness.  Abdominal: Soft. Bowel sounds are normal. There is no tenderness. There is no rebound and no guarding.  Musculoskeletal: Normal range of motion. She exhibits no edema.  Lymphadenopathy:    She has no cervical adenopathy.  Neurological:  Patient is unresponsive and not following commands. She seems to have some contracture hemiparesis on her left side which is unchanged from baseline per family. She appears to have some left-sided facial drooping, unclear if new or old. She does have an intact gag reflex.  Skin: Skin is warm and dry. No rash noted.  Psychiatric: She has a normal mood and affect.     ED Treatments / Results  Labs (all labs ordered are listed, but only abnormal results are displayed) Labs Reviewed  CBC WITH DIFFERENTIAL/PLATELET - Abnormal; Notable for the following:       Result Value    RDW 17.8 (*)    All other components within normal limits  COMPREHENSIVE METABOLIC PANEL - Abnormal; Notable for the following:    Glucose, Bld 151 (*)    All other components within normal limits  URINALYSIS, ROUTINE W REFLEX MICROSCOPIC - Abnormal; Notable for the following:    APPearance TURBID (*)    Protein,  ur 100 (*)    Leukocytes, UA MODERATE (*)    Bacteria, UA FEW (*)    Squamous Epithelial / LPF 6-30 (*)    All other components within normal limits  I-STAT CHEM 8, ED - Abnormal; Notable for the following:    Glucose, Bld 149 (*)    All other components within normal limits  CBG MONITORING, ED - Abnormal; Notable for the following:    Glucose-Capillary 151 (*)    All other components within normal limits  I-STAT ARTERIAL BLOOD GAS, ED - Abnormal; Notable for the following:    pO2, Arterial 376.0 (*)    All other components within normal limits  URINE CULTURE  CULTURE, BLOOD (ROUTINE X 2)  CULTURE, BLOOD (ROUTINE X 2)  I-STAT CG4 LACTIC ACID, ED  I-STAT TROPONIN, ED    EKG  EKG Interpretation None       Radiology Ct Head Wo Contrast  Result Date: 06/18/2017 CLINICAL DATA:  Altered mental status. EXAM: CT HEAD WITHOUT CONTRAST TECHNIQUE: Contiguous axial images were obtained from the base of the skull through the vertex without intravenous contrast. COMPARISON:  11/17/2016 FINDINGS: Brain: Moderate left occipital patchy low-density with neutral mass effect that has occurred since prior and has a subacute appearance. There is extensive chronic small vessel ischemic change with remote right basal ganglia infarction. White matter gliosis is confluent. There is asymmetric low-density in the right cerebral white matter, where there is also greater volume loss, stable from prior and consistent with asymmetric ischemic injury. Vascular: Atherosclerotic calcification.  No hyperdense vessel. Skull: Stable benign-appearing lucencies in the calvarium. Osteopenia. Sinuses/Orbits: No  acute finding.  Bilateral cataract resection. IMPRESSION: 1. Moderate left occipital infarct with a subacute appearance. 2. Extensive chronic small vessel ischemia with remote perforator infarct in the right basal ganglia. Ischemic gliosis more severely affects the right cerebral white matter. Electronically Signed   By: Monte Fantasia M.D.   On: 06/18/2017 13:32   Dg Chest Portable 1 View  Result Date: 06/18/2017 CLINICAL DATA:  Respiratory failure, COPD, former smoker. Mental status changes. EXAM: PORTABLE CHEST 1 VIEW COMPARISON:  Chest x-ray of April 17, 2016 FINDINGS: The lungs are mildly hypoinflated. There is no focal infiltrate. The interstitial markings are mildly prominent though stable. The cardiac silhouette is enlarged. The pulmonary vascularity is not engorged. There is calcification in the wall of the thoracic aorta. The endotracheal tube tip lies 3.3 cm above the carina. The esophagogastric tube tip and proximal port project below the inferior margin of the image. IMPRESSION: Mild hypoinflation. No acute pneumonia. Cardiomegaly without pulmonary edema. The support tubes are in reasonable position. Thoracic aortic atherosclerosis. Electronically Signed   By: David  Martinique M.D.   On: 06/18/2017 12:25    Procedures Procedure Name: Intubation Date/Time: 06/18/2017 2:33 PM Performed by: Atheena Spano Pre-anesthesia Checklist: Patient identified, Patient being monitored, Emergency Drugs available, Timeout performed and Suction available Oxygen Delivery Method: Non-rebreather mask Preoxygenation: Pre-oxygenation with 100% oxygen Induction Type: Rapid sequence Ventilation: Mask ventilation without difficulty Laryngoscope Size: Glidescope and 3 Grade View: Grade I Tube type: Subglottic suction tube Tube size: 7.5 mm Number of attempts: 1 Airway Equipment and Method: Video-laryngoscopy Placement Confirmation: ETT inserted through vocal cords under direct vision,  CO2 detector and Breath  sounds checked- equal and bilateral Dental Injury: Teeth and Oropharynx as per pre-operative assessment       (including critical care time)  Medications Ordered in ED Medications  fentaNYL 2578mcg in NS 269mL (36mcg/ml) infusion-PREMIX (50 mcg/hr  Intravenous New Bag/Given 06/18/17 1216)  fentaNYL (SUBLIMAZE) bolus via infusion 25 mcg (25 mcg Intravenous Bolus from Bag 06/18/17 1217)  sodium chloride 0.9 % bolus 1,000 mL (0 mLs Intravenous Stopped 06/18/17 1328)  phenylephrine 0.4-0.9 MG/10ML-% injection (40 mcg  Given 06/18/17 1202)  etomidate (AMIDATE) injection 20 mg (20 mg Intravenous Given 06/18/17 1203)  succinylcholine (ANECTINE) injection 100 mg (100 mg Intravenous Given 06/18/17 1204)  cefTRIAXone (ROCEPHIN) 1 g in dextrose 5 % 50 mL IVPB (1 g Intravenous New Bag/Given 06/18/17 1453)     Initial Impression / Assessment and Plan / ED Course  I have reviewed the triage vital signs and the nursing notes.  Pertinent labs & imaging results that were available during my care of the patient were reviewed by me and considered in my medical decision making (see chart for details).     Patient is a 83 year old female who presents with a sudden episode of unresponsiveness. This is unclear etiology. She was intubated for airway protection after having a discussion with the patient's daughter. Patient has a DO NOT RESUSCITATE order per family for compressions only. The daughter does consent to intubation.  CT scan shows a subacute infarct in the left occipital area. Her vital signs have been stable. She was started on a fentanyl drip for sedation. She does have evidence of urinary tract infection and was given a dose Rocephin for this but no other suggestions of sepsis. I spoke with Dr. Rory Percy with neurology who will see the pt.  They are requesting an MRI of the brain which was ordered.  I spoke with Dr. Pearline Cables with [facility] who will admit the patient.  CRITICAL CARE Performed by: Jareli Highland Total  critical care time: 50 minutes Critical care time was exclusive of separately billable procedures and treating other patients. Critical care was necessary to treat or prevent imminent or life-threatening deterioration. Critical care was time spent personally by me on the following activities: development of treatment plan with patient and/or surrogate as well as nursing, discussions with consultants, evaluation of patient's response to treatment, examination of patient, obtaining history from patient or surrogate, ordering and performing treatments and interventions, ordering and review of laboratory studies, ordering and review of radiographic studies, pulse oximetry and re-evaluation of patient's condition.   Final Clinical Impressions(s) / ED Diagnoses   Final diagnoses:  Unresponsive episode  Acute respiratory failure, unspecified whether with hypoxia or hypercapnia (HCC)    New Prescriptions New Prescriptions   No medications on file     Malvin Johns, MD 06/18/17 1530

## 2017-06-19 ENCOUNTER — Inpatient Hospital Stay (HOSPITAL_COMMUNITY): Payer: Medicare Other

## 2017-06-19 DIAGNOSIS — R4189 Other symptoms and signs involving cognitive functions and awareness: Secondary | ICD-10-CM

## 2017-06-19 DIAGNOSIS — Z09 Encounter for follow-up examination after completed treatment for conditions other than malignant neoplasm: Secondary | ICD-10-CM

## 2017-06-19 DIAGNOSIS — Z4659 Encounter for fitting and adjustment of other gastrointestinal appliance and device: Secondary | ICD-10-CM

## 2017-06-19 DIAGNOSIS — R4182 Altered mental status, unspecified: Secondary | ICD-10-CM

## 2017-06-19 DIAGNOSIS — J9600 Acute respiratory failure, unspecified whether with hypoxia or hypercapnia: Secondary | ICD-10-CM

## 2017-06-19 LAB — BLOOD GAS, ARTERIAL
Acid-base deficit: 0.2 mmol/L (ref 0.0–2.0)
Bicarbonate: 24.4 mmol/L (ref 20.0–28.0)
DRAWN BY: 225631
FIO2: 40
LHR: 14 {breaths}/min
O2 Saturation: 97.3 %
PCO2 ART: 43.3 mmHg (ref 32.0–48.0)
PEEP/CPAP: 5 cmH2O
PH ART: 7.369 (ref 7.350–7.450)
Patient temperature: 98.6
VT: 470 mL
pO2, Arterial: 101 mmHg (ref 83.0–108.0)

## 2017-06-19 LAB — COMPREHENSIVE METABOLIC PANEL
ALBUMIN: 3.3 g/dL — AB (ref 3.5–5.0)
ALT: 18 U/L (ref 14–54)
ANION GAP: 6 (ref 5–15)
AST: 22 U/L (ref 15–41)
Alkaline Phosphatase: 88 U/L (ref 38–126)
BILIRUBIN TOTAL: 0.4 mg/dL (ref 0.3–1.2)
BUN: 14 mg/dL (ref 6–20)
CALCIUM: 9.6 mg/dL (ref 8.9–10.3)
CO2: 26 mmol/L (ref 22–32)
Chloride: 108 mmol/L (ref 101–111)
Creatinine, Ser: 0.82 mg/dL (ref 0.44–1.00)
GLUCOSE: 111 mg/dL — AB (ref 65–99)
POTASSIUM: 3.7 mmol/L (ref 3.5–5.1)
Sodium: 140 mmol/L (ref 135–145)
TOTAL PROTEIN: 6.4 g/dL — AB (ref 6.5–8.1)

## 2017-06-19 LAB — GLUCOSE, CAPILLARY
GLUCOSE-CAPILLARY: 129 mg/dL — AB (ref 65–99)
Glucose-Capillary: 113 mg/dL — ABNORMAL HIGH (ref 65–99)
Glucose-Capillary: 129 mg/dL — ABNORMAL HIGH (ref 65–99)

## 2017-06-19 LAB — CREATININE, SERUM
CREATININE: 0.79 mg/dL (ref 0.44–1.00)
GFR calc Af Amer: >60 mL/min (ref >60)

## 2017-06-19 LAB — HEMOGLOBIN A1C
Hgb A1c MFr Bld: 5.2 % (ref 4.8–5.6)
Mean Plasma Glucose: 102.54 mg/dL

## 2017-06-19 MED ORDER — LEVETIRACETAM 500 MG/5ML IV SOLN
1000.0000 mg | INTRAVENOUS | Status: AC
  Administered 2017-06-19: 1000 mg via INTRAVENOUS
  Filled 2017-06-19: qty 10

## 2017-06-19 MED ORDER — LORAZEPAM 2 MG/ML IJ SOLN: 1.0000 mg | INTRAMUSCULAR | Status: DC | PRN

## 2017-06-19 MED ORDER — SODIUM CHLORIDE 0.9 % IV SOLN
500.0000 mg | Freq: Two times a day (BID) | INTRAVENOUS | Status: DC
  Administered 2017-06-19 – 2017-06-21 (×4): 500 mg via INTRAVENOUS
  Filled 2017-06-19 (×7): qty 5

## 2017-06-19 MED ORDER — ORAL CARE MOUTH RINSE
15.0000 mL | Freq: Four times a day (QID) | OROMUCOSAL | Status: DC
  Administered 2017-06-19 – 2017-06-21 (×10): 15 mL via OROMUCOSAL

## 2017-06-19 MED ORDER — CHLORHEXIDINE GLUCONATE 0.12% ORAL RINSE (MEDLINE KIT)
15.0000 mL | Freq: Two times a day (BID) | OROMUCOSAL | Status: DC
  Administered 2017-06-19 – 2017-06-21 (×5): 15 mL via OROMUCOSAL

## 2017-06-19 MED ORDER — ASPIRIN 325 MG PO TABS
325.0000 mg | ORAL_TABLET | Freq: Every day | ORAL | Status: DC
  Administered 2017-06-19 – 2017-06-20 (×2): 325 mg via ORAL
  Filled 2017-06-19 (×3): qty 1

## 2017-06-19 NOTE — Progress Notes (Signed)
Patient transported on vent to MRI and back to ED by Gennaro Africa, RRT without complication.

## 2017-06-19 NOTE — ED Notes (Signed)
Shireen Quan Lenhard-Dean  [phone] Pt daughter

## 2017-06-19 NOTE — Progress Notes (Signed)
LTM day 1 started. Dr Doree Albee notified.

## 2017-06-19 NOTE — Procedures (Signed)
ELECTROENCEPHALOGRAM REPORT  Date of Study: 06/19/2017  Patient's Name: [name] MRN: [id number] Date of Birth: Jan 21, 1926  Referring Provider: Sampson Goon, MD  Clinical History: 83 year old who appears to suffer from vascular dementia who over approximately the past week has been having repeated automatisms of the tongue and upper extremities who became unresponsive and found to have strokes on MRI.  Medications: chlorhexidine gluconate (MEDLINE KIT) (PERIDEX) 0.12 % solution 15 mL  fentaNYL (SUBLIMAZE) bolus via infusion 25 mcg  fentaNYL 2574mg in NS 2565m(1052mml) infusion-PREMIX  heparin injection 5,000 Units  ipratropium-albuterol (DUONEB) 0.5-2.5 (3) MG/3ML nebulizer solution 3 mL   Technical Summary: A multichannel digital EEG recording measured by the international 10-20 system with electrodes applied with paste and impedances below 5000 ohms performed as portable with EKG monitoring in an unresponsive patient.  Hyperventilation and photic stimulation were not performed.  The digital EEG was referentially recorded, reformatted, and digitally filtered in a variety of bipolar and referential montages for optimal display.   Description: The patient is intubated and unresponsive during the recording, but not on sedation.  There is a symmetric, medium voltage 5 Hz posterior dominant rhythm.  This is admixed with diffuse 4-5 Hz theta and 2-3 Hz delta slowing of the background.  There is superimposed mild slowing and background suppression over the left hemisphere.  There were no epileptiform discharges or electrographic seizures seen.    EKG lead was unremarkable.  Impression: This EEG is abnormal due to: 1. diffuse slowing of the EEG background. 2. Superimposed left hemisphere slowing.  Clinical Correlation of the above findings indicates: 1. diffuse cerebral dysfunction that is non-specific in etiology and can be seen with hypoxic/ischemic injury, toxic/metabolic  encephalopathies, neurodegenerative disorders, or medication effect.    2.  A focal structural or physiologic abnormality in the left hemisphere.  The absence of epileptiform discharges does not rule out a clinical diagnosis of epilepsy.  Clinical correlation is advised.  AdaMetta ClinesO

## 2017-06-19 NOTE — Progress Notes (Signed)
PULMONARY / CRITICAL CARE MEDICINE   Name: [name] MRN: [id number] DOB: 03/03/1926    ADMISSION DATE:  06/18/2017   CHIEF COMPLAINT: Sudden unresponsiveness, nausea and vomiting.  HISTORY OF PRESENT ILLNESS:   This is an 83 year old who appears to suffer from vascular dementia who over approximately the past week has been having repeated automatisms of the tongue and upper extremities. She was also complaining of some discharge from her eyes and photophobia. Visual acuity has been declining. MRI was scheduled for today to evaluate these complaints however after the patient was placed in the ambulance for transfer she became suddenly unresponsive and suffered from an episode of nausea and vomiting. She is now intubated and mechanically ventilated in the department of emergency medicine and is not responsive despite having received no sedation since intubation.  SUBJECTIVE:  No acute events overnight  VITAL SIGNS: BP 124/74   Pulse 78   Temp 99.2 F (37.3 C) (Oral)   Resp 11   Ht 5\' 6"  (1.676 m)   Wt 86.1 kg (189 lb 13.1 oz)   SpO2 99%   BMI 30.64 kg/m   HEMODYNAMICS:    VENTILATOR SETTINGS: Vent Mode: CPAP;PSV FiO2 (%):  [40 %-100 %] 40 % Set Rate:  [14 bmp] 14 bmp Vt Set:  [470 mL] 470 mL PEEP:  [5 cmH20] 5 cmH20 Pressure Support:  [5 cmH20] 5 cmH20 Plateau Pressure:  [16 cmH20-19 cmH20] 19 cmH20  INTAKE / OUTPUT: I/O last 3 completed shifts: In: 2037.9 [I.V.:487.9; IV Piggyback:1550] Out: 225 [Urine:225]  PHYSICAL EXAMINATION: General:  Obese elderly female, orally intubated, mechanically ventilated, and minimally responsive Neuro:  Lethargic, questionably follows commands with arms, did not wiggle toes to command.  HEENT:[place]/AT, no JVD, PERRL Cardiovascular: RRR no MRG Lungs:  Symmetric air movement, Clear Abdomen:  Obese, soft, non-tender, non-distended Musculoskeletal: LUE contracted.  LABS:  BMET  Recent Labs Lab 06/18/17 1153 06/18/17 1211  06/18/17 2321 06/19/17 0816  NA 139 141  --  140  K 3.7 3.7  --  3.7  CL 107 107  --  108  CO2 23  --   --  26  BUN 13 13  --  14  CREATININE 0.81 0.70 0.79 0.82  GLUCOSE 151* 149*  --  111*    Electrolytes  Recent Labs Lab 06/18/17 1153 06/19/17 0816  CALCIUM 10.3 9.6    CBC  Recent Labs Lab 06/18/17 1153 06/18/17 1211 06/18/17 2321  WBC 8.4  --  8.4  HGB 12.8 13.3 10.9*  HCT 40.8 39.0 35.8*  PLT 208  --  159    Coag's No results for input(s): APTT, INR in the last 168 hours.  Sepsis Markers  Recent Labs Lab 06/18/17 1211  LATICACIDVEN 1.36    ABG  Recent Labs Lab 06/18/17 1430 06/19/17 0552  PHART 7.402 7.369  PCO2ART 40.8 43.3  PO2ART 376.0* 101    Liver Enzymes  Recent Labs Lab 06/18/17 1153 06/19/17 0816  AST 23 22  ALT 17 18  ALKPHOS 99 88  BILITOT 0.5 0.4  ALBUMIN 3.6 3.3*    Cardiac Enzymes No results for input(s): TROPONINI, PROBNP in the last 168 hours.  Glucose  Recent Labs Lab 06/18/17 1149 06/19/17 0146  GLUCAP 151* 113*    Imaging Ct Angio Head W Or Wo Contrast  Result Date: 06/18/2017 CLINICAL DATA:  Altered mental status EXAM: CT ANGIOGRAPHY HEAD TECHNIQUE: Multidetector CT imaging of the head was performed using the standard protocol during bolus administration of  intravenous contrast. Multiplanar CT image reconstructions and MIPs were obtained to evaluate the vascular anatomy. CONTRAST:  50 mL Isovue 370 COMPARISON:  Head CT 04/17/2016 FINDINGS: Anterior circulation: --Intracranial internal carotid arteries: Normal. --Anterior cerebral arteries: Normal. --Middle cerebral arteries: There is occlusion of the left middle cerebral artery M1 segment with reconstitution of the distal M1 segment. The right MCA is normal. --Posterior communicating arteries: Diminutive right. Normal caliber left. Posterior circulation: --Posterior cerebral arteries: Fetal origin of the left posterior cerebral artery. Normal conventional  right PCA. --Superior cerebellar arteries: Normal. --Basilar artery: Normal. --Anterior inferior cerebellar arteries: Normal. --Posterior inferior cerebellar arteries: Right-dominant. Venous sinuses: As permitted by contrast timing, patent. Anatomic variants: Fetal origin of the left posterior cerebral artery. Delayed phase: No parenchymal contrast enhancement. Review of the MIP images confirms the above findings IMPRESSION: 1. Occlusion of a short segment of the left middle cerebral artery, with normal opacification distally. 2. No hemorrhage or midline shift. 3. Advanced chronic ischemic microangiopathy. Critical Value/emergent results were called by telephone at the time of interpretation on 06/18/2017 at 9:22 pm to Dr. Malvin Johns, who verbally acknowledged these results. Electronically Signed   By: Ulyses Jarred M.D.   On: 06/18/2017 21:33   Ct Head Wo Contrast  Result Date: 06/18/2017 CLINICAL DATA:  Altered mental status. EXAM: CT HEAD WITHOUT CONTRAST TECHNIQUE: Contiguous axial images were obtained from the base of the skull through the vertex without intravenous contrast. COMPARISON:  11/17/2016 FINDINGS: Brain: Moderate left occipital patchy low-density with neutral mass effect that has occurred since prior and has a subacute appearance. There is extensive chronic small vessel ischemic change with remote right basal ganglia infarction. White matter gliosis is confluent. There is asymmetric low-density in the right cerebral white matter, where there is also greater volume loss, stable from prior and consistent with asymmetric ischemic injury. Vascular: Atherosclerotic calcification.  No hyperdense vessel. Skull: Stable benign-appearing lucencies in the calvarium. Osteopenia. Sinuses/Orbits: No acute finding.  Bilateral cataract resection. IMPRESSION: 1. Moderate left occipital infarct with a subacute appearance. 2. Extensive chronic small vessel ischemia with remote perforator infarct in the right  basal ganglia. Ischemic gliosis more severely affects the right cerebral white matter. Electronically Signed   By: Monte Fantasia M.D.   On: 06/18/2017 13:32   Mr Brain Wo Contrast  Result Date: 06/18/2017 CLINICAL DATA:  Unresponsive patient EXAM: MRI HEAD WITHOUT CONTRAST TECHNIQUE: Multiplanar, multiecho pulse sequences of the brain and surrounding structures were obtained without intravenous contrast. COMPARISON:  CTA head 06/18/2017 FINDINGS: Brain: The midline structures are normal. There is multifocal abnormal diffusion weighted signal at multiple old areas within the left hemisphere. A single site in the lower left frontal lobe shows corresponding reduced diffusion on the ADC map (series 3 image 37). Diffusion abnormality at the base of the left central sulcus may be a subacute infarct (series 3 image 40). There are old infarcts of the right frontal lobe, left occipital lobe and multiple basal ganglia lacunar infarcts. There is diffuse confluent hyperintense T2-weighted signal within the periventricular white matter, most often seen in the setting of chronic microvascular ischemia. No intraparenchymal hematoma or chronic microhemorrhage. The dura is normal and there is no extra-axial collection. Vascular: Mild hyperintensity within the left MCA flow void at the site of the filling defect demonstrated on the comparison CTA. Skull and upper cervical spine: The visualized skull base, calvarium, upper cervical spine and extracranial soft tissues are normal. Sinuses/Orbits: No fluid levels or advanced mucosal thickening. No mastoid or middle  ear effusion. There is fluid layering in the nasopharynx. Normal orbits. IMPRESSION: 1. Multiple diffusion-weighted abnormalities within the left hemisphere, only one of which shows corresponding reduced diffusion on the ADC map, consistent with a small focus of acute ischemia in the inferior left frontal lobe. The other areas correspond to a combination of subacute  infarcts (base of left central sulcus) and T2 shine through at sites of chronic infarcts (left occipital lobe). 2. No mass effect or acute hemorrhage. 3. Severe chronic ischemic microangiopathy and multiple old infarcts. Electronically Signed   By: Ulyses Jarred M.D.   On: 06/18/2017 22:28   Dg Chest Portable 1 View  Result Date: 06/18/2017 CLINICAL DATA:  Respiratory failure, COPD, former smoker. Mental status changes. EXAM: PORTABLE CHEST 1 VIEW COMPARISON:  Chest x-ray of April 17, 2016 FINDINGS: The lungs are mildly hypoinflated. There is no focal infiltrate. The interstitial markings are mildly prominent though stable. The cardiac silhouette is enlarged. The pulmonary vascularity is not engorged. There is calcification in the wall of the thoracic aorta. The endotracheal tube tip lies 3.3 cm above the carina. The esophagogastric tube tip and proximal port project below the inferior margin of the image. IMPRESSION: Mild hypoinflation. No acute pneumonia. Cardiomegaly without pulmonary edema. The support tubes are in reasonable position. Thoracic aortic atherosclerosis. Electronically Signed   By: David  Martinique M.D.   On: 06/18/2017 12:25   Ct No Charge  Result Date: 06/19/2017 CLINICAL DATA:  Altered mental status EXAM: CT ANGIOGRAPHY HEAD TECHNIQUE: Multidetector CT imaging of the head was performed using the standard protocol during bolus administration of intravenous contrast. Multiplanar CT image reconstructions and MIPs were obtained to evaluate the vascular anatomy. CONTRAST:  50 mL Isovue 370 COMPARISON:  Head CT 04/17/2016 FINDINGS: Anterior circulation: --Intracranial internal carotid arteries: Normal. --Anterior cerebral arteries: Normal. --Middle cerebral arteries: There is occlusion of the left middle cerebral artery M1 segment with reconstitution of the distal M1 segment. The right MCA is normal. --Posterior communicating arteries: Diminutive right. Normal caliber left. Posterior circulation:  --Posterior cerebral arteries: Fetal origin of the left posterior cerebral artery. Normal conventional right PCA. --Superior cerebellar arteries: Normal. --Basilar artery: Normal. --Anterior inferior cerebellar arteries: Normal. --Posterior inferior cerebellar arteries: Right-dominant. Venous sinuses: As permitted by contrast timing, patent. Anatomic variants: Fetal origin of the left posterior cerebral artery. Delayed phase: No parenchymal contrast enhancement. Review of the MIP images confirms the above findings IMPRESSION: 1. Occlusion of a short segment of the left middle cerebral artery, with normal opacification distally. 2. No hemorrhage or midline shift. 3. Advanced chronic ischemic microangiopathy. Critical Value/emergent results were called by telephone at the time of interpretation on 06/18/2017 at 9:22 pm to Dr. Malvin Johns, who verbally acknowledged these results. Electronically Signed   By: Ulyses Jarred M.D.   On: 06/18/2017 21:33     STUDIES:  CTA pending   DISCUSSION: Sudden decrease in mental status in a patient with vascular dementia. CT and MRI describing acute/subacute event. Neurology to see for further recommendations.   ASSESSMENT / PLAN:  NEUROLOGIC A:   Acute encephalopathy Vascular dementia Acute/Subacute CVA  - Neuro consulted, awaiting their recommendations  - EEG pending   PULMONARY A: COPD by history Respiratory failure due to poor airway protection.  P:   Continue MV, weaning well, plan one way extubation tomorrow [place] Weaning OK, mental status precludes further attempts.  VAP bundle  CARDIOVASCULAR A:  H/o HTN, HLD - Telemetry - DNR if arrests.   HEME A: Anemia -  Follow CBC   FAMILY  - Updates: Daughter updated bedside  Georgann Housekeeper, AGACNP-BC [facility] Pulmonology/Critical Care Pager [phone] or [phone]  06/19/2017 12:42 PM  STAFF NOTE: Linwood Dibbles, MD FACP have personally reviewed patient's available data,  including medical history, events of note, physical examination and test results as part of my evaluation. I have discussed with resident/NP and other care providers such as pharmacist, RN and RRT. In addition, I personally evaluated patient and elicited key findings of: NOT awake, not openeing eyes, not moving ext, perrl, breaths over vent rate, CTA slight coarse BS, abdo soft, not stiff, CT head I reviewed and MRi I reviewed with acute on chronic new ischemic insults and watershed left, pcxr I reviewed did not show new infiltrate or aspiration, she presents from a HORRIBLY baseline quality of life and functional status and events from yesterday reflect another neuro  Insult, asa, need to rule out sublic seizures, eeg done, neur consulted, poor prognosis, d/w daughter , she agrees to no escalation and no repeat ett when plan extubation likely in am after she confirms with sister, any heroics would be futile, acls, vent, hd, shock all futile, we should not allow her to suffer, she will confirm with sister then wants hospice at home if we can arrange after extubation ( if she is not actively dying) , abg reviewed keep same MV, weaning well, daughter understands risk asp and death from extubation  In future The patient is critically ill with multiple organ systems failure and requires high complexity decision making for assessment and support, frequent evaluation and titration of therapies, application of advanced monitoring technologies and extensive interpretation of multiple databases.   Critical Care Time devoted to patient care services described in this note is35  Minutes. This time reflects time of care of this signee: Merrie Roof, MD FACP. This critical care time does not reflect procedure time, or teaching time or supervisory time of PA/NP/Med student/Med Resident etc but could involve care discussion time. Rest per NP/medical resident whose note is outlined above and that I agree with   Lavon Paganini. Titus Mould, MD, [place] Pgr: [facility] 06/19/2017 1:03 PM

## 2017-06-19 NOTE — Consult Note (Signed)
NEURO HOSPITALIST CONSULT NOTE   Requestig physician: Dr. Pearline Cables   Reason for Consult: AMS   History obtained from:   Chart     HPI:                                                                                                                                          [name] is an 83 y.o. female with a past medical history of dementia, CHF, chronic kidney disease, chronic schizophrenia, COPD, depression, diabetes, hypertension, hyperlipidemia, history of stroke in 1997 with residual left hemiparesis, who was brought in to the emergency room yesterday for sudden unresponsiveness. According to the daughter, she has been having orofacial lingual automatisms and some gaze deviation since end of August. She is also had uncontrollable involuntary movements of the right upper extremity. Her visual acuity has been declining as she is been unable to read, in spite of her dementia at baseline to allow that she was able to read the Bible and her Bible study. For those reasons, the outpatient physician had recommended an MRI of the brain. She was in an ambulance, on her way to the MRI center when she became completely unresponsive. She was brought into the emergency room. The ER physician had discussion with the family about her not being able to protect her airway and reverse her DO NOT RESUSCITATE order, intubated her and she is now admitted to the ICU. An MRI of the brain was obtained. It reveals right-sided areas of acute strokes. Neurology consultation was obtained for the altered mental status and strokes. Patient has been on no sedation. She has not been following commands since.   Past Medical History:  Diagnosis Date  . Acute sinusitis   . Anemia   . Arthritis   . Breast cancer ([place])   . Cervical spondylosis   . CHF (congestive heart failure) (HCC)    chronic and stable per MD note  . Chronic kidney disease    chronic kidney disease per MD notes  . Chronic  schizophrenia ([place])   . COPD (chronic obstructive pulmonary disease) ([place])   . Dementia   . Depressive disorder   . DM (diabetes mellitus) ([place])    DIET CONTROLLED  . Foley catheter in place   . GERD (gastroesophageal reflux disease)   . Glaucoma   . Hyperlipidemia   . Hypertension   . On home oxygen therapy    2 L PER NASAL CANNULA  . Osteoporosis   . Schizophrenia ([place])   . Stenosis of lumbosacral spine   . Stroke (HCC)    L hemiparesis  . Wheelchair dependent     Past Surgical History:  Procedure Laterality Date  . APPENDECTOMY    . BREAST SURGERY     breast cancer  .  CATARACT EXTRACTION, BILATERAL    . CHOLECYSTECTOMY    . INSERTION OF SUPRAPUBIC CATHETER N/A 04/29/2014   Procedure: INSERTION OF SUPRAPUBIC CATHETER;  Surgeon: Arvil Persons, MD;  Location: [facility];  Service: Urology;  Laterality: N/A;  . JOINT REPLACEMENT     L TOTAL KNEE  . UTERINE SUSPENSION      Family History  Problem Relation Age of Onset  . Asthma Daughter   . Heart disease Sister        3 sisters  . Heart disease Brother        x 2  . Cancer Brother        multiple myloma   Social History:  reports that she quit smoking about 19 years ago. Her smoking use included Cigarettes. She has a 15.00 pack-year smoking history. She has never used smokeless tobacco. She reports that she does not drink alcohol or use drugs.  Allergies  Allergen Reactions  . Other Other (See Comments)    PER DAUGHTER, MOST PAIN MEDICATIONS "KNOCK OUT THE PATIENT" FOR SEVERAL DAYS  PT ACCEPTS NO BLOOD PRODUCTS  . Latex Other (See Comments)    "BURNS THE SKIN"  . Lisinopril Other (See Comments)    Weakness in the legs  . Ultram [Tramadol] Other (See Comments)    VERTIGO FOR DAYS  . Codeine Nausea And Vomiting and Rash    And "zonked her out" (per daughter)   MEDICATIONS:                                                                                                                     Prior to Admission:   Prescriptions Prior to Admission  Medication Sig Dispense Refill Last Dose  . albuterol (PROVENTIL) (2.5 MG/3ML) 0.083% nebulizer solution Take 2.5 mg by nebulization 3 (three) times daily.   06/18/2017 at 0900  . amLODipine (NORVASC) 2.5 MG tablet Take 2.5 mg by mouth daily. HOLD IF SYSTOLIC B/P READING IS LESS THAN 110   06/18/2017 at 0900  . aspirin 81 MG chewable tablet Chew 1 tablet (81 mg total) by mouth daily. 30 tablet 0 06/18/2017 at 0900  . budesonide (PULMICORT) 0.5 MG/2ML nebulizer solution Take 0.5 mg by nebulization 2 (two) times daily.   06/18/2017 at 0900  . Carboxymethylcellulose Sodium (REFRESH TEARS OP) Place 2 drops into both eyes 3 (three) times daily.   06/18/2017 at 0900  . Cholecalciferol (VITAMIN D3) 2000 UNITS TABS Take 2,000 Units by mouth daily.    06/18/2017 at 0900  . Cyanocobalamin (VITAMIN B-12) 1000 MCG SUBL Place 0.5 tablets under the tongue daily with breakfast.   06/18/2017 at 0900  . donepezil (ARICEPT) 10 MG tablet Take 10 mg by mouth at bedtime.    06/17/2017 at 2100  . fluticasone (FLONASE) 50 MCG/ACT nasal spray Place 1 spray into the nose every 12 (twelve) hours. Reported on 11/10/2015   06/18/2017 at 0900  . hydrALAZINE (APRESOLINE) 25 MG tablet Take 25 mg by mouth every 8 (eight) hours.  HOLD IF SYSTOLIC B/P READING IS LESS THAN 110   06/18/2017 at 0600  . mirabegron ER (MYRBETRIQ) 25 MG TB24 tablet Take 25 mg by mouth daily.    06/17/2017 at 2100  . Multiple Vitamin (MULTIVITAMINS PO) Take 1 tablet by mouth daily.   06/18/2017 at 0900  . Omega-3 Fatty Acids (FISH OIL) 1000 MG CAPS Take 1 capsule by mouth at bedtime.   06/17/2017 at 2100  . OXYGEN Inhale 2 L into the lungs continuous.    Continuous at Continuous  . Propylene Glycol (SYSTANE BALANCE OP) Place 1 drop into both eyes 2 (two) times daily.    06/18/2017 at 0900  . senna (SENOKOT) 8.6 MG TABS tablet Take 1 tablet (8.6 mg total) by mouth daily as needed for mild constipation. (Patient taking differently: Take 1  tablet by mouth 2 (two) times daily. HOLD FOR LOOSE STOOLS) 120 each 0 06/18/2017 at 0900  . UNABLE TO FIND Med Pass: Drink 60 ml's (2 ounces) by mouth two times a day   06/18/2017 at 0900  . polyethylene glycol (MIRALAX / GLYCOLAX) packet Take 17 g by mouth daily. (Patient not taking: Reported on 06/18/2017) 14 each 0 Not Taking at Unknown time   Scheduled: . aspirin  325 mg Oral Daily  . chlorhexidine gluconate (MEDLINE KIT)  15 mL Mouth Rinse BID  . heparin  5,000 Units Subcutaneous Q8H  . ipratropium-albuterol  3 mL Nebulization Q6H  . mouth rinse  15 mL Mouth Rinse QID   Continuous: . sodium chloride    . sodium chloride 75 mL/hr at 06/19/17 0155  . fentaNYL infusion INTRAVENOUS 25 mcg/hr (06/19/17 1155)   ROS:                                                                                                                                       History obtained from unobtainable from patient due to intubated  Blood pressure (!) 146/70, pulse 79, temperature 99.2 F (37.3 C), temperature source Oral, resp. rate 10, height _0  (1.676 m), weight 86.1 kg (189 lb 13.1 oz), SpO2 99 %.   Neurologic Examination:                                                                                                      HEENT-  Normocephalic, no lesions, without obvious abnormality.  Normal external eye and conjunctiva.  Normal TM's bilaterally.  Normal auditory canals and external ears. Normal external nose, mucus membranes and septum.  Normal pharynx. Cardiovascular- S1, S2 normal, pulses palpable throughout   Lungs- chest clear, no wheezing, rales, normal symmetric air entry Abdomen- normal findings: bowel sounds normal Extremities- no edema Lymph-no adenopathy palpable Musculoskeletal-no joint tenderness, deformity or swelling Skin-warm and dry, no hyperpigmentation, vitiligo, or suspicious lesions  Neurological Examination Mental Status: Intubated follows no commands.eye deviation to the  left, can get the eyes to midline but cannot look towards the right. Cranial Nerves: II: no blink to threat on the right, possible blink to threat on the left. III,IV, VI: ptosis not present, eyes deviated to the left does not cross midline with dolls. Pupils 2 mm bilaterally V,VII: Facial symmetry difficult to ascertain because of the endotracheal tube. Motor: Increased tone throughout, left arm severely increased tone, held in flexion, bilateral LE increased tone. No response or movement to pain Sensory: no response to pain Deep Tendon Reflexes: 1+ and symmetric throughout Plantars: Upgoing bilaterally. Sustained clonus in both lower extremity.  Lab Results: Basic Metabolic Panel:  Recent Labs Lab 06/18/17 1153 06/18/17 1211 06/18/17 2321 06/19/17 0816  NA 139 141  --  140  K 3.7 3.7  --  3.7  CL 107 107  --  108  CO2 23  --   --  26  GLUCOSE 151* 149*  --  111*  BUN 13 13  --  14  CREATININE 0.81 0.70 0.79 0.82  CALCIUM 10.3  --   --  9.6   Liver Function Tests:  Recent Labs Lab 06/18/17 1153 06/19/17 0816  AST 23 22  ALT 17 18  ALKPHOS 99 88  BILITOT 0.5 0.4  PROT 7.0 6.4*  ALBUMIN 3.6 3.3*   CBC:  Recent Labs Lab 06/18/17 1153 06/18/17 1211 06/18/17 2321  WBC 8.4  --  8.4  NEUTROABS 4.3  --  6.8  HGB 12.8 13.3 10.9*  HCT 40.8 39.0 35.8*  MCV 84.3  --  84.6  PLT 208  --  159   CBG:  Recent Labs Lab 06/18/17 1149 06/19/17 0146  GLUCAP 151* 113*   Microbiology: Results for orders placed or performed during the hospital encounter of 06/18/17  Urine culture     Status: None (Preliminary result)   Collection Time: 06/18/17  2:04 PM  Result Value Ref Range Status   Specimen Description URINE, CLEAN CATCH  Final   Special Requests NONE  Final   Culture CULTURE REINCUBATED FOR BETTER GROWTH  Final   Report Status PENDING  Incomplete   Imaging: Noncontrast CT of the head-subacute looking left occipital infarct. Extensive chronic small vessel  disease. CT angiogram of head and neck IMPRESSION: 1. Occlusion of a short segment of the left middle cerebral artery, with normal opacification distally. 2. No hemorrhage or midline shift. 3. Advanced chronic ischemic microangiopathy. MRI brain shows multiple DWI hyperintensities in the left hemisphere almost in a watershed pattern, not all of whom have a ADC correlate. Agree with radiology that this might be a combination of acute and subacute lesions of T2 shine through. Severe chronic white matter disease.  Stat EEG:    Impression: This EEG is abnormal due to: 1. diffuse slowing of the EEG background. 2. Superimposed left hemisphere slowing. Clinical Correlation of the above findings indicates: 1. diffuse cerebral dysfunction that is non-specific in etiology and can be seen with hypoxic/ischemic injury, toxic/metabolic encephalopathies, neurodegenerative disorders, or medication effect.   2.  A focal structural or physiologic abnormality in the left hemisphere.   Assessment and plan per attending neurologist  Etta Quill PA-C [facility] [phone]  06/19/2017, 12:17 PM  Attending addendum Patient was seen and examined. I interviewed the daughter at bedside, reviewed the chart, examined the patient and reviewed the images independently. I have edited and checked the H&P above for accuracy and to include information obtained by me and to reflect my exam My assessment and recommendations are below.  Assessment: 83 year old with a past medical history of dementia, CHF, chronic kidney disease, schizophrenia, COPD, depression diabetes hypertension hyperlipidemia and history of stroke in 77 and 9 with residual left hemiparesis, brought in for sudden unresponsiveness. Noncontrast CT of the head concerning for a subacute left parieto-occipital stroke. CT angiogram of the head only shows severe proximal left M1 stenosis. MRI of the brain shows different ages of strokes in the  left cerebral hemisphere, some in the watershed pattern.. She has been having episodes of staring, and orofacial automatisms for the past few weeks. I do not seem to have a unifying diagnosis for her current condition. The CT angiogram finding of the proximal left M1 stenosis does not completely explain the location of her left cerebral strokes although she has a fetal PCA on the left but no proximal carotid pathology. I would've expected a much larger stroke in the left MCA territory with that stenosis. But because of the fetal PCA, maybe she is compensating with collaterals. I want to ensure, given the structural abnormalities, both acute and chronic, that she is not in nonconvulsive status epilepticus. Routine EEG was significant for diffuse slowing with superimposed left hemispherical slowing. No evidence of status epilepticus on the routine 30 minute study.  Impression Evaluate for underlying status epilepticus Evaluate for stroke Toxic metabolic encephalopathy   Recommendations At this time I would like to continue her on long-term EEG monitoring. I would recommend starting Keppra 500 mg twice a day. She should be on an aspirin for stroke prevention, which she already is. Please continue with the aspirin. I would recommend an echo, A1c and lipid panel be checked. Given her extensive atrophy and white matter disease, history of stroke with progressive and recently rapid decline in cognition, and a poor exam with new strokes and severe intracranial stenosis, she is at high risk for having more strokes and further deterioration in her neurological condition. I had a detailed discussion with her daughter at bedside and explained the whole neurological workup and examination.  I discussed my recommendations with the pulmonary critical care attending as well.  We'll continue to follow with you  Amie Portland, MD [facility] [phone]  If 7pm to 7am, please call on call as  listed on AMION.

## 2017-06-19 NOTE — Progress Notes (Signed)
EEG Completed; Results Pending  

## 2017-06-20 ENCOUNTER — Other Ambulatory Visit (HOSPITAL_COMMUNITY): Payer: Medicare Other

## 2017-06-20 ENCOUNTER — Inpatient Hospital Stay (HOSPITAL_COMMUNITY): Payer: Medicare Other

## 2017-06-20 DIAGNOSIS — Z9911 Dependence on respirator [ventilator] status: Secondary | ICD-10-CM

## 2017-06-20 DIAGNOSIS — R4189 Other symptoms and signs involving cognitive functions and awareness: Secondary | ICD-10-CM

## 2017-06-20 LAB — LIPID PANEL
Cholesterol: 185 mg/dL (ref 0–200)
HDL: 40 mg/dL — ABNORMAL LOW (ref >40)
LDL CALC: 119 mg/dL — AB (ref 0–99)
Total CHOL/HDL Ratio: 4.6 RATIO
Triglycerides: 128 mg/dL (ref <150)
VLDL: 26 mg/dL (ref 0–40)

## 2017-06-20 LAB — PHOSPHORUS: PHOSPHORUS: 2.7 mg/dL (ref 2.5–4.6)

## 2017-06-20 LAB — BASIC METABOLIC PANEL
ANION GAP: 9 (ref 5–15)
BUN: 14 mg/dL (ref 6–20)
CO2: 24 mmol/L (ref 22–32)
Calcium: 9.5 mg/dL (ref 8.9–10.3)
Chloride: 109 mmol/L (ref 101–111)
Creatinine, Ser: 0.73 mg/dL (ref 0.44–1.00)
GFR calc Af Amer: >60 mL/min (ref >60)
Glucose, Bld: 90 mg/dL (ref 65–99)
POTASSIUM: 3.3 mmol/L — AB (ref 3.5–5.1)
SODIUM: 142 mmol/L (ref 135–145)

## 2017-06-20 LAB — URINE CULTURE

## 2017-06-20 LAB — MAGNESIUM: Magnesium: 2 mg/dL (ref 1.7–2.4)

## 2017-06-20 LAB — CBC
HEMATOCRIT: 34.1 % — AB (ref 36.0–46.0)
HEMOGLOBIN: 10.3 g/dL — AB (ref 12.0–15.0)
MCH: 26.1 pg (ref 26.0–34.0)
MCHC: 30.2 g/dL (ref 30.0–36.0)
MCV: 86.5 fL (ref 78.0–100.0)
Platelets: 148 10*3/uL — ABNORMAL LOW (ref 150–400)
RBC: 3.94 MIL/uL (ref 3.87–5.11)
RDW: 18.1 % — ABNORMAL HIGH (ref 11.5–15.5)
WBC: 7.7 10*3/uL (ref 4.0–10.5)

## 2017-06-20 LAB — MRSA PCR SCREENING: MRSA BY PCR: NEGATIVE

## 2017-06-20 MED ORDER — PANTOPRAZOLE SODIUM 40 MG IV SOLR
40.0000 mg | INTRAVENOUS | Status: DC
  Administered 2017-06-20 – 2017-06-21 (×2): 40 mg via INTRAVENOUS
  Filled 2017-06-20 (×3): qty 40

## 2017-06-20 MED ORDER — MORPHINE SULFATE (PF) 2 MG/ML IV SOLN
2.0000 mg | INTRAVENOUS | Status: DC | PRN
  Administered 2017-06-20: 2 mg via INTRAVENOUS
  Filled 2017-06-20: qty 1

## 2017-06-20 MED ORDER — MORPHINE SULFATE (PF) 4 MG/ML IV SOLN
0.2500 mg | INTRAVENOUS | Status: DC | PRN
Start: 2017-06-20 — End: 2017-06-21
  Administered 2017-06-20 – 2017-06-21 (×5): 1 mg via INTRAVENOUS
  Filled 2017-06-20 (×5): qty 1

## 2017-06-20 MED ORDER — MIDAZOLAM HCL 2 MG/2ML IJ SOLN: 2.0000 mg | INTRAMUSCULAR | Status: DC | PRN

## 2017-06-20 MED ORDER — POTASSIUM CHLORIDE 10 MEQ/100ML IV SOLN
10.0000 meq | INTRAVENOUS | Status: DC
  Administered 2017-06-20 (×2): 10 meq via INTRAVENOUS
  Filled 2017-06-20 (×2): qty 100

## 2017-06-20 MED ORDER — LORAZEPAM 2 MG/ML IJ SOLN: 0.5000 mg | INTRAMUSCULAR | Status: DC | PRN

## 2017-06-20 MED ORDER — MORPHINE SULFATE (PF) 2 MG/ML IV SOLN
0.2500 mg | INTRAVENOUS | Status: DC | PRN
  Administered 2017-06-20 (×2): 1 mg via INTRAVENOUS
  Filled 2017-06-20 (×2): qty 1

## 2017-06-20 MED ORDER — POTASSIUM CHLORIDE 10 MEQ/50ML IV SOLN: 10.0000 meq | INTRAVENOUS | Status: DC

## 2017-06-20 NOTE — Progress Notes (Signed)
Nutrition Brief Note  Chart reviewed. Discussed plan of care with RN. Pt now transitioning to comfort care.  No nutrition interventions warranted at this time.  Please consult as needed.   Molli Barrows, RD, LDN, [place] Pager [phone] After Hours Pager [phone]

## 2017-06-20 NOTE — Progress Notes (Signed)
[facility] Liaison Note  Hospital Liaison Team contacted by CSW regarding possible [place] placement for patient.  Chart reviewed and spoke with daughter Shireen Quan to acknowledge referral. Daughter familiar with hospice and very interested in [facility] if mother tolerates extubation this afternoon.   Unfortunately, [facility] does not have a bed today. Family and CSW (via voicemail) made aware.  [facility] Liaison will follow up with CSW and family tomorrow if room becomes available.   Please do not hesitate to call with questions.   Thank you,  Gar Ponto, [facility] Liaison [phone]  [place] found on AMION.

## 2017-06-20 NOTE — Progress Notes (Addendum)
PULMONARY / CRITICAL CARE MEDICINE   Name: [name] MRN: [id number] DOB: 06-04-1926    ADMISSION DATE:  06/18/2017   CHIEF COMPLAINT: Sudden unresponsiveness, nausea and vomiting.  HISTORY OF PRESENT ILLNESS:   This is an 83 year old who appears to suffer from vascular dementia who over approximately the past week has been having repeated automatisms of the tongue and upper extremities. She was also complaining of some discharge from her eyes and photophobia. Visual acuity has been declining. MRI was scheduled for today to evaluate these complaints however after the patient was placed in the ambulance for transfer she became suddenly unresponsive and suffered from an episode of nausea and vomiting. She is now intubated and mechanically ventilated in the department of emergency medicine and is not responsive despite having received no sedation since intubation.  SUBJECTIVE:  No acute events overnight ceeg  VITAL SIGNS: BP (!) 95/53   Pulse 70   Temp 98.7 F (37.1 C) (Oral)   Resp 14   Ht 5\' 6"  (1.676 m)   Wt 195 lb 15.8 oz (88.9 kg)   SpO2 100%   BMI 31.63 kg/m   HEMODYNAMICS:    VENTILATOR SETTINGS: Vent Mode: PRVC FiO2 (%):  [40 %] 40 % Set Rate:  [14 bmp] 14 bmp Vt Set:  [470 mL] 470 mL PEEP:  [5 cmH20] 5 cmH20 Pressure Support:  [5 cmH20] 5 cmH20 Plateau Pressure:  [15 cmH20-19 cmH20] 19 cmH20  INTAKE / OUTPUT: I/O last 3 completed shifts: In: 3004.3 [I.V.:2289.3; IV Piggyback:715] Out: 575 [Urine:575]  PHYSICAL EXAMINATION: General:  Elderly female, intubated, not responding to commands  Neuro: Lethargic, not responding to commands this AM  HEENT: PERRL Cardiovascular: RRR, no m/r/g Lungs:  CTA anteriorly Abdomen:  Obese, soft Musculoskeletal: no LE edema  LABS:  BMET  Recent Labs Lab 06/18/17 1153 06/18/17 1211 06/18/17 2321 06/19/17 0816 06/20/17 0440  NA 139 141  --  140 142  K 3.7 3.7  --  3.7 3.3*  CL 107 107  --  108 109  CO2 23  --    --  26 24  BUN 13 13  --  14 14  CREATININE 0.81 0.70 0.79 0.82 0.73  GLUCOSE 151* 149*  --  111* 90    Electrolytes  Recent Labs Lab 06/18/17 1153 06/19/17 0816 06/20/17 0440  CALCIUM 10.3 9.6 9.5  MG  --   --  2.0  PHOS  --   --  2.7    CBC  Recent Labs Lab 06/18/17 1153 06/18/17 1211 06/18/17 2321 06/20/17 0440  WBC 8.4  --  8.4 7.7  HGB 12.8 13.3 10.9* 10.3*  HCT 40.8 39.0 35.8* 34.1*  PLT 208  --  159 148*    Coag's No results for input(s): APTT, INR in the last 168 hours.  Sepsis Markers  Recent Labs Lab 06/18/17 1211  LATICACIDVEN 1.36    ABG  Recent Labs Lab 06/18/17 1430 06/19/17 0552  PHART 7.402 7.369  PCO2ART 40.8 43.3  PO2ART 376.0* 101    Liver Enzymes  Recent Labs Lab 06/18/17 1153 06/19/17 0816  AST 23 22  ALT 17 18  ALKPHOS 99 88  BILITOT 0.5 0.4  ALBUMIN 3.6 3.3*    Cardiac Enzymes No results for input(s): TROPONINI, PROBNP in the last 168 hours.  Glucose  Recent Labs Lab 06/18/17 1134 06/18/17 1149 06/19/17 0146 06/19/17 0327  GLUCAP 129* 151* 113* 129*    Imaging Dg Chest Port 1 View  Result Date:  06/20/2017 CLINICAL DATA:  Ventilator dependent EXAM: PORTABLE CHEST 1 VIEW COMPARISON:  11/21/2008 FINDINGS: Endotracheal tube and nasogastric catheter are again noted and stable. Cardiac shadow remains enlarged. Aortic calcifications are again noted with tortuosity. The lungs are well aerated with mild interstitial changes. Increasing left basilar atelectasis is noted. No sizable effusion or pneumothorax is seen. IMPRESSION: Increasing left basilar atelectasis. Electronically Signed   By: Inez Catalina M.D.   On: 06/20/2017 06:41   Dg Abd Portable 1v  Result Date: 06/19/2017 CLINICAL DATA:  83 year old female enteric tube placement. Left MCA infarcts. EXAM: PORTABLE ABDOMEN - 1 VIEW COMPARISON:  Lumbar radiographs 11/17/2016. FINDINGS: Portable AP supine view at 1329 hours. Enteric tube courses to the epigastrium  and loops in the stomach, tip at the level of the distal gastric body. Paucity of bowel gas elsewhere. Confluent opacity with some air bronchograms at the left lung base. Stable visualized osseous structures. IMPRESSION: 1. Enteric tube looped in the distal stomach. 2. Mild medial left lower lobe atelectasis or consolidation. Electronically Signed   By: Genevie Ann M.D.   On: 06/19/2017 13:53   Ct No Charge  Result Date: 06/19/2017 CLINICAL DATA:  Altered mental status EXAM: CT ANGIOGRAPHY HEAD TECHNIQUE: Multidetector CT imaging of the head was performed using the standard protocol during bolus administration of intravenous contrast. Multiplanar CT image reconstructions and MIPs were obtained to evaluate the vascular anatomy. CONTRAST:  50 mL Isovue 370 COMPARISON:  Head CT 04/17/2016 FINDINGS: Anterior circulation: --Intracranial internal carotid arteries: Normal. --Anterior cerebral arteries: Normal. --Middle cerebral arteries: There is occlusion of the left middle cerebral artery M1 segment with reconstitution of the distal M1 segment. The right MCA is normal. --Posterior communicating arteries: Diminutive right. Normal caliber left. Posterior circulation: --Posterior cerebral arteries: Fetal origin of the left posterior cerebral artery. Normal conventional right PCA. --Superior cerebellar arteries: Normal. --Basilar artery: Normal. --Anterior inferior cerebellar arteries: Normal. --Posterior inferior cerebellar arteries: Right-dominant. Venous sinuses: As permitted by contrast timing, patent. Anatomic variants: Fetal origin of the left posterior cerebral artery. Delayed phase: No parenchymal contrast enhancement. Review of the MIP images confirms the above findings IMPRESSION: 1. Occlusion of a short segment of the left middle cerebral artery, with normal opacification distally. 2. No hemorrhage or midline shift. 3. Advanced chronic ischemic microangiopathy. Critical Value/emergent results were called by  telephone at the time of interpretation on 06/18/2017 at 9:22 pm to Dr. Malvin Johns, who verbally acknowledged these results. Electronically Signed   By: Ulyses Jarred M.D.   On: 06/18/2017 21:33     STUDIES:  CTA 10/3 >> occlusion of MCA, advanced chronic ischemic microangiopathy  MR Brain 10/3 >>acute ischemia inf L frontal lobe, no hemorrhage, multiple old infarcts  DISCUSSION: Sudden decrease in mental status in a patient with vascular dementia. CT and MRI describing acute/subacute event. Neurology to see for further recommendations.   ASSESSMENT / PLAN:  NEUROLOGIC A:   Acute encephalopathy Vascular dementia Acute/Subacute CVA  - Neuro consulted, awaiting their recommendations - long term EEG monitoring - Keppra 500 mg BID per neuro recs - continue ASA for stroke prevention    PULMONARY A: COPD by history Respiratory failure due to poor airway protection.  P:   MV - PRVC rate 14, 8 cc/kg, PEEP 5 Duonebs Weaning OK, mental status precludes further attempts.  ?extubate for hospice today VAP bundle  CARDIOVASCULAR A:  H/o HTN, HLD - Telemetry - DNR if arrests  HEME A: Anemia - Follow CBC  FAMILY  - Updates:  Daughter updated bedside  Georgann Housekeeper, AGACNP-BC [facility] Pulmonology/Critical Care Pager [phone] or [phone]  06/20/2017 7:28 AM  STAFF NOTE: Linwood Dibbles, MD FACP have personally reviewed patient's available data, including medical history, events of note, physical examination and test results as part of my evaluation. I have discussed with resident/NP and other care providers such as pharmacist, RN and RRT. In addition, I personally evaluated patient and elicited key findings of: lethargic, perrl int moves rue, jvd wnl, ett wnl, coarse mechanical BS, abdo soft, SR, no edema, some rigidity, pcxr which I reviewed shows int changes, ett wnl, left effusion likely,abg reviewed, keep same MV, could attempt SBT with close observation apnea  and Tv alarms, I discussed with daughter poor prognosis and quality of life and we discussed role of comfort care and extubation with possible hospice if surviving in house, EEG reviewed, no foci, keppra empiric per neuro, hypokalemia, supp k ,   Echo to dc if we push towards to comfort care, TF to start of we continued vent support, I wil update daughter in full, goal even balance, continued heroics, vent, acls, pressors, hd, any forms of life support are medically ineffective therapy and futile The patient is critically ill with multiple organ systems failure and requires high complexity decision making for assessment and support, frequent evaluation and titration of therapies, application of advanced monitoring technologies and extensive interpretation of multiple databases.   Critical Care Time devoted to patient care services described in this note is 30 Minutes. This time reflects time of care of this signee: Merrie Roof, MD FACP. This critical care time does not reflect procedure time, or teaching time or supervisory time of PA/NP/Med student/Med Resident etc but could involve care discussion time. Rest per NP/medical resident whose note is outlined above and that I agree with   Lavon Paganini. Titus Mould, [place] Pgr: [facility] 06/20/2017 8:28 AM   I have had extensive discussions with family daughters x 1. We discussed patients current circumstances and organ failures. We also discussed patient's prior wishes under circumstances such as this. Family has decided to NOT perform resuscitation. They wish extubation and full comofrt  When daughter arrives. They wish if possible [facility] hosp[facility].

## 2017-06-20 NOTE — Clinical Social Work Note (Signed)
Clinical Social Work Assessment  Patient Details  Name: [name] MRN: [id number] Date of Birth: November 22, 1925  Date of referral:  06/20/17               Reason for consult:  End of Life/Hospice                Permission sought to share information with:  Case Manager Permission granted to share information::  Yes, Verbal Permission Granted  Name::     [place]::     Relationship::     Contact Information:     Housing/Transportation Living arrangements for the past 2 months:   (unsure. ) Source of Information:  Adult Children Patient Interpreter Needed:  None Criminal Activity/Legal Involvement Pertinent to Current Situation/Hospitalization:  No - Comment as needed Significant Relationships:  Adult Children Lives with:    Do you feel safe going back to the place where you live?  No Need for family participation in patient care:  Yes (Comment)  Care giving concerns:  CSW spoke with pt's family at bedside as pt is intubated at this time. At the moment no concerns have been presented to [place].    Social Worker assessment / plan:  CSW received consult for [employer] placement as pt is expected to be extubated soon and may need hospice placement. CSW spoke with family in the room and explained to them that per a palliative, if pt survives extubation then CSW would assist with placement at [facility] as needed. Family was understanding and sought support from [place] at this time. Weekend CSW to follow up with Harmon Pier from [facility] on referral and placement if needed.    Employment status:  Retired Nurse, adult PT Recommendations:  Not assessed at this time Information / Referral to community resources:  Other (Comment Required) (hospice)  Patient/Family's Response to care:  Pt's family is understanding and agreeable to plan of care at this time.   Patient/Family's Understanding of and Emotional Response to Diagnosis, Current Treatment, and  Prognosis:  No further questions or concern have been presented to CSW at this time.   Emotional Assessment Appearance:  Appears stated age Attitude/Demeanor/Rapport:   (pt is intubated at this time. ) Affect (typically observed):   (pt is intubated at this time. ) Orientation:    Alcohol / Substance use:  Not Applicable Psych involvement (Current and /or in the community):  No (Comment)  Discharge Needs  Concerns to be addressed:  Denies Needs/Concerns at this time Readmission within the last 30 days:  No Current discharge risk:  None Barriers to Discharge:  No Barriers Identified   Wetzel Bjornstad, [place] 06/20/2017, 3:16 PM

## 2017-06-20 NOTE — Progress Notes (Signed)
[facility] Progress Note Patient Name: [name] DOB: 01/13/1926 MRN: [id number]   Date of Service  06/20/2017  HPI/Events of Note  Best Practice  eICU Interventions  PPI ordered     Intervention Category Intermediate Interventions: Best-practice therapies (e.g. DVT, beta blocker, etc.)  Rommie Dunn 06/20/2017, 5:40 AM

## 2017-06-20 NOTE — Procedures (Signed)
LTM-EEG Report  HISTORY: Continuous video-EEG monitoring performed for 83 year old with vascular dementia, acute stroke, altered mental status.  ACQUISITION: International 10-20 system for electrode placement; 18 channels with additional eyes linked to ipsilateral ears and EKG. Additional T1-T2 electrodes were used. Continuous video recording obtained.   EEG NUMBER:   MEDICATIONS:  Day 1: LEV  DAY #1: from 6962 06/19/17 to 0730 06/20/17   BACKGROUND: An overall medium voltage continuous recording with some spontaneous variability and reactivity. Waking background was asymmetric and consisted of medium voltage delta-theta activity over the left with loss of detail on that side. Activity over the right consisted of theta-delta activity but no clear evidence of a posterior basic rhythm. Generalized semirhythmic to rhythmic delta activity was superimposed on the waking background at times. State changes were present, however no clear stage II sleep architecture was apparent.   EPILEPTIFORM/PERIODIC ACTIVITY: none SEIZURES: none EVENTS:  none  EKG: there was an irregular rhythm  SUMMARY: This was a moderately abnormal continuous video EEG due to loss of normal background and diffuse slow activity with additional multifocal slowing. This pattern was consistent with an encephalopathy pattern with superimposed multifocal bilateral cerebral disturbances. There were no epileptiform discharges or seizures. Events of orofacial automatisms were not reported during this recording period.

## 2017-06-20 NOTE — Progress Notes (Signed)
Palliative Medicine RN Note:  Consult order noted. Due to high consult volume, PMT provider will likely not be available until tomorrow, 10/6.   If the family is confident in their desire for [facility] (assuming the patient survives the extubation), a SW consult can be placed for them to pursue placement at [facility]. If there are questions about goals, PMT will address them when we have and available provider.  I have paged Dr Titus Mould to update him, and I also spoke with pt's RN Jeneen Rinks.  Marjie Skiff Jesper Stirewalt, RN, BSN, [facility] 06/20/2017 1:09 PM Cell [phone] 8:00-4:00 Monday-Friday Office [phone]

## 2017-06-20 NOTE — Procedures (Signed)
Extubation Procedure Note  Patient Details:   Name: [name] DOB: 1926-05-28 MRN: [id number]   Airway Documentation:  Airway (Active)    Evaluation  O2 sats: stable throughout Complications: No apparent complications Patient did tolerate procedure well. Bilateral Breath Sounds: Clear, Diminished   No   Pt extubated to [place] 4 L with humidity. Family at bedside.  Bayard Beaver 06/20/2017, 3:54 PM

## 2017-06-20 NOTE — Progress Notes (Signed)
Wasted remaining 230 mls of fentanyl drip down sink.  Witnessed by Nettie Elm, RN and Pam Drown, RN.

## 2017-06-20 NOTE — Progress Notes (Signed)
Neurology Progress Note   S:// No acute overnight events. Remains intubated. Family leaning towards [facility].  O:// Current vital signs: BP (!) 142/67   Pulse 77   Temp 98.7 F (37.1 C) (Oral)   Resp 14   Ht 5' 6"  (1.676 m)   Wt 88.9 kg (195 lb 15.8 oz)   SpO2 99%   BMI 31.63 kg/m  Vital signs in last 24 hours: Temp:  [98.7 F (37.1 C)-99.2 F (37.3 C)] 98.7 F (37.1 C) (10/05 0803) Pulse Rate:  [69-98] 77 (10/05 0827) Resp:  [10-17] 14 (10/05 0827) BP: (93-146)/(50-125) 142/67 (10/05 0827) SpO2:  [97 %-100 %] 99 % (10/05 0827) FiO2 (%):  [40 %] 40 % (10/05 0827) Weight:  [88.9 kg (195 lb 15.8 oz)] 88.9 kg (195 lb 15.8 oz) (10/05 0446) GENERAL:intubated HEENT: - Normocephalic and atraumatic, dry mm, no LN++, no Thyromegally LUNGS - Clear to auscultation bilaterally with no wheezes CV - S1S2 RRR, no m/r/g, equal pulses bilaterally. ABDOMEN - Soft, nontender, nondistended with normoactive BS Ext: warm, well perfused, intact peripheral pulses, 1+ edema, LUE in contracture Neurological Intubated. Opens eyes to loud voice. Does not follow commands. PERRL, no blink to threat. Left gaze pref. Facial symmtery difficulty to ascertain with tube Motor: increased tone all over - left arm severely rigid and flexed. Sensory: grimaces to nox stim weakly Coor: can not be tested Plantars: upgoing b/l, Sustained clonus b/l   Medications  Current Facility-Administered Medications:  .  0.9 %  sodium chloride infusion, 250 mL, Intravenous, PRN, Sampson Goon, MD .  0.9 %  sodium chloride infusion, , Intravenous, Continuous, Sampson Goon, MD, Last Rate: 75 mL/hr at 06/20/17 0705 .  aspirin tablet 325 mg, 325 mg, Oral, Daily, Raylene Miyamoto, MD, 325 mg at 06/19/17 1636 .  chlorhexidine gluconate (MEDLINE KIT) (PERIDEX) 0.12 % solution 15 mL, 15 mL, Mouth Rinse, BID, Raylene Miyamoto, MD, 15 mL at 06/20/17 0710 .  fentaNYL (SUBLIMAZE) bolus via infusion 25 mcg, 25 mcg,  Intravenous, Q1H PRN, Malvin Johns, MD, 25 mcg at 06/19/17 2011 .  fentaNYL 2530mg in NS 2566m(1068mml) infusion-PREMIX, 25-400 mcg/hr, Intravenous, Continuous, BelMalvin JohnsD, Stopped at 06/20/17 0705 .  heparin injection 5,000 Units, 5,000 Units, Subcutaneous, Q8H, GraSampson GoonD, 5,000 Units at 06/20/17 [id number] ipratropium-albuterol (DUONEB) 0.5-2.5 (3) MG/3ML nebulizer solution 3 mL, 3 mL, Nebulization, Q6H, GraSampson GoonD, 3 mL at 06/20/17 0827 .  levETIRAcetam (KEPPRA) 500 mg in sodium chloride 0.9 % 100 mL IVPB, 500 mg, Intravenous, Q12H, SmiMarliss CootsA-C, Stopped at 06/20/17 [id number] LORazepam (ATIVAN) injection 1 mg, 1 mg, Intravenous, Q4H PRN, EubOmar PersonP .  MEDLINE mouth rinse, 15 mL, Mouth Rinse, QID, FeiRaylene MiyamotoD, 15 mL at 06/20/17 0443 .  pantoprazole (PROTONIX) injection 40 mg, 40 mg, Intravenous, Q24H, Deterding, EliGuadelupe SabinD, 40 mg at 06/20/17 0707  Labs CBC    Component Value Date/Time   WBC 7.7 06/20/2017 0440   RBC 3.94 06/20/2017 0440   HGB 10.3 (L) 06/20/2017 0440   HCT 34.1 (L) 06/20/2017 0440   PLT 148 (L) 06/20/2017 0440   MCV 86.5 06/20/2017 0440   MCH 26.1 06/20/2017 0440   MCHC 30.2 06/20/2017 0440   RDW 18.1 (H) 06/20/2017 0440   LYMPHSABS 1.2 06/18/2017 2321   MONOABS 0.4 06/18/2017 2321   EOSABS 0.0 06/18/2017 2321   BASOSABS 0.0 06/18/2017 2321   CMP  Component Value Date/Time   NA 142 06/20/2017 0440   NA 140 06/25/2016   K 3.3 (L) 06/20/2017 0440   CL 109 06/20/2017 0440   CO2 24 06/20/2017 0440   GLUCOSE 90 06/20/2017 0440   BUN 14 06/20/2017 0440   BUN 13 06/25/2016   CREATININE 0.73 06/20/2017 0440   CALCIUM 9.5 06/20/2017 0440   PROT 6.4 (L) 06/19/2017 0816   ALBUMIN 3.3 (L) 06/19/2017 0816   AST 22 06/19/2017 0816   ALT 18 06/19/2017 0816   ALKPHOS 88 06/19/2017 0816   BILITOT 0.4 06/19/2017 0816   GFRNONAA >60 06/20/2017 0440   GFRAA >60 06/20/2017 0440   Lipid Panel      Component Value Date/Time   CHOL 185 06/20/2017 0440   TRIG 128 06/20/2017 0440   HDL 40 (L) 06/20/2017 0440   CHOLHDL 4.6 06/20/2017 0440   VLDL 26 06/20/2017 0440   LDLCALC 119 (H) 06/20/2017 0440   Imaging I have reviewed images in epic and the results pertinent to this consultation are:  Noncontrast CT of the head-subacute looking left occipital infarct. Extensive chronic small vessel disease. CT angiogram of head and neck IMPRESSION: 1. Occlusion of a short segment of the left middle cerebral artery, with normal opacification distally. 2. No hemorrhage or midline shift. 3. Advanced chronic ischemic microangiopathy. MRI brain shows multiple DWI hyperintensities in the left hemisphere almost in a watershed pattern, not all of whom have a ADC correlate. Agree with radiology that this might be a combination of acute and subacute lesions of T2 shine through. Severe chronic white matter disease.  Stat EEG:    Impression: ThisEEG is abnormal due to: 1.diffuse slowing of the EEG background. 2. Superimposed left hemisphere slowing. Clinical Correlation of the above findings indicates: 1.diffuse cerebral dysfunction that is non-specific in etiology and can be seen with hypoxic/ischemic injury, toxic/metabolic encephalopathies, neurodegenerative disorders, or medication effect.  2. A focal structural or physiologic abnormality in the left hemisphere  LTM EEG - diffuse and focal slowing, no seizures  Assessment and recommendations:  89/F with PMH dementia, CHF, CKD, Schizophrenia, COPD, DM, HTN, HLD, stroke with residual LSW since 1997, brought in for sudden unresponsiveness. She's been having episodes concerning for seizures for past 8-10  Weeks. MRI brain shows area of restricted diffusion and areas of T2 shinethrough in the left cerebral hemisphere consistent with strokes of different ages. CTA shows a severely stenosed left M1.  Her imaging does not explain the severely  encephalopathic exam and electrographic findings. She is not having active seizure activity.  In summary, she has left cerebral hemispheric strokes - watershed as well as M1 territory, an severe intracranial stenosis of left M1, possible seizures, baseline advanced dementia, and many co-morbidities with a poor baseline functional status. At this time, the stroke work up has been nearly completed. A1c normal. LDL119. Echo pending.  Until decision is made for [facility]MO, my recs are below:  -ASA and plavix -Atorvastatin 80 -c/w Keppra 500 BID  -Minimize sedation. -d/c LTM EEG -Correct toxic metabolic derangements as you are  Please call us as needed.  Amie Portland, MD [facility] [phone]  If 7pm to 7am, please call on call as listed on AMION.

## 2017-06-20 NOTE — Progress Notes (Signed)
CSW reached out to Bevely Palmer from [facility] to see if they received referral for pt at this time. CSW did not receive an answer dan requested that Bevely Palmer call CSW back with any new information. CSW will continue to assist with discharge needs.     Virgie Dad Linley Moxley, MSW, [place] [facility] Clinical Social Worker [phone]

## 2017-06-20 NOTE — Progress Notes (Signed)
CSW spoke with Bevely Palmer from [facility]. As of right now per [place] is full and can not offer beds as of today. Bevely Palmer mentioned that she placed pt on the list for a bed. Per palliative note, placement at [facility] is depend upon how well pt does with extubation when extubated. At this time CSW will continue to follow for discharge needs.    Virgie Dad Damyiah Moxley, MSW, [place] [facility] Clinical Social Worker [phone]

## 2017-06-21 DIAGNOSIS — G9340 Encephalopathy, unspecified: Secondary | ICD-10-CM

## 2017-06-21 DIAGNOSIS — J9601 Acute respiratory failure with hypoxia: Secondary | ICD-10-CM

## 2017-06-21 MED ORDER — SODIUM CHLORIDE 0.9 % IV SOLN: 500.0000 mg | Freq: Two times a day (BID) | INTRAVENOUS | Status: AC

## 2017-06-21 MED ORDER — MORPHINE SULFATE (PF) 2 MG/ML IV SOLN: 0.2500 mg | INTRAVENOUS | Status: AC | PRN

## 2017-06-21 MED ORDER — LORAZEPAM 2 MG/ML IJ SOLN: 0.5000 mg | INTRAMUSCULAR | Status: AC | PRN

## 2017-06-21 NOTE — Consult Note (Addendum)
[facility] room available this morning for transfer today. Met with patient's daughter Shireen Quan in sitting room to complete paper work for transfer today. Dr. Orpah Melter to assume care per family preference. Spoke with Dr. Algis Liming to make him aware. Made CSW Mardene Celeste aware earlier this morning. Mardene Celeste advised me Leatrice Jewels is covering today.    RN please call report to [phone].  Please fax discharge summary to [phone].  Also provided the above numbers to CSW Ashly who is aware.   Thank you,  Erling Conte, LCSW [phone]

## 2017-06-21 NOTE — Progress Notes (Signed)
Clinical Social Worker facilitated patient discharge including contacting patient family and facility to confirm patient discharge plans.  Clinical information faxed to facility and family agreeable with plan.  CSW arranged ambulance transport via [facility] to [facility].  RN to call [phone] for report prior to discharge.  Clinical Social Worker will sign off for now as social work intervention is no longer needed. Please consult us again if new need arises.  Deysy Schabel, MSW, LCSWA [phone]  

## 2017-06-21 NOTE — Progress Notes (Signed)
Pt for discharge going to [facility] 2 daughters at the bedside agreed for transfer, discontinue peripheral IV line, report given to Christoper Allegra, Caryl Pina case manager call the [facility], pt DNR, given Morphine prior to discharge.

## 2017-06-21 NOTE — Discharge Summary (Signed)
Physician Discharge Summary  [name] VFI:[id number] DOB: 30-May-1926  PCP: No primary care provider on file.  Admit date: 06/18/2017 Discharge date: 06/21/2017  Recommendations for Outpatient Follow-up:  1. Dr. Orpah Melter at [facility] for and of life hospice care.  Home Health: None Equipment/Devices: None    Discharge Condition: Guarded and at high risk for expected decline and death.  CODE STATUS: DO NOT RESUSCITATE  Diet recommendation: Nothing by mouth or if consistently alert then comfort feeds of choice.  Discharge Diagnoses:  Active Problems:   Altered mental state   Unresponsive episode   Acute respiratory failure (HCC)   Follow up   Encounter for nasogastric (NG) tube placement   Ventilator dependent [employer])   Brief Summary: 83 year old female with PMH of dementia, CHF, chronic kidney disease, schizophrenia, COPD, depression, DM, HTN, HLD, CVA with residual left hemiparesis was brought to the ED due to sudden onset of unresponsiveness. For approximately a week prior to admission, she was reportedly having repeated automatisms of the tongue and upper extremities and was complaining of some discharge from her eyes and photophobia. Visual acuity had been declining. MRI was scheduled for day of admission to evaluate these complaints however when the patient was placed in the ambulance for transfer, she became suddenly unresponsive and suffered from an episode of nausea and vomiting. She was intubated and mechanically ventilated in the ED and was not responsive despite having received no sedation since intubation. She was admitted to the ICU by the critical care service. Neurology was consulted.  Acute encephalopathy/vascular dementia/acute versus subacute CVA/presumed seizures Neurology was consulted. They commented that she's been having episodes concerning for seizures for the past 8-10 weeks. MRI brain showed areas of restricted diffusion and areas of T2 shine through in  the left cerebral hemisphere consistent with strokes of different ages. CTA showed a severely stenosed left M1. Her images do not explain the severely encephalopathic exam and EEG findings. In summary they indicated that she has left cerebral hemisphere strokes-watershed as well as M1 territory, a severe intracranial stenosis of left M1, possible seizures, baseline advanced dementia and many comorbidities with a poor baseline functional status. Spot EEG and continuous EEG was consistent with encephalopathy but no epileptiform discharges or seizures. Critical care discussed in detail with patient's daughter regarding poor prognosis and quality of life. She was made DO NOT RESUSCITATE and was terminally extubated with plans for hospice care. She was transferred to the medical floor under the hospitalist service. Hospice team evaluated her and have a bed available at [facility] today. Patient appears comfortable and in no obvious distress. May consider stopping Keppra at [facility].  Acute respiratory failure due to poor airway protection from acute encephalopathy/COPD by history Patient was terminally extubated on 10/5. Currently breathing spontaneously and does not appear in any distress.  History of HTN, DM, HLD anemia, CHF   Consultations:  Neurology  CCM  Procedures:  Intubated and extubated.   Discharge Instructions  Discharge Instructions    Call MD for:    Complete by:  As directed    Seizures.   Call MD for:  difficulty breathing, headache or visual disturbances    Complete by:  As directed    Call MD for:  persistant nausea and vomiting    Complete by:  As directed    Call MD for:  severe uncontrolled pain    Complete by:  As directed    Discharge instructions    Complete by:  As  directed    Diet: Nothing by mouth or if alert, comfort feed of choice.   Increase activity slowly    Complete by:  As directed        Medication List    STOP taking these medications    albuterol (2.5 MG/3ML) 0.083% nebulizer solution Commonly known as:  PROVENTIL   amLODipine 2.5 MG tablet Commonly known as:  NORVASC   aspirin 81 MG chewable tablet   budesonide 0.5 MG/2ML nebulizer solution Commonly known as:  PULMICORT   donepezil 10 MG tablet Commonly known as:  ARICEPT   Fish Oil 1000 MG Caps   fluticasone 50 MCG/ACT nasal spray Commonly known as:  FLONASE   hydrALAZINE 25 MG tablet Commonly known as:  APRESOLINE   MULTIVITAMINS PO   MYRBETRIQ 25 MG Tb24 tablet Generic drug:  mirabegron ER   polyethylene glycol packet Commonly known as:  MIRALAX / GLYCOLAX   REFRESH TEARS OP   senna 8.6 MG Tabs tablet Commonly known as:  SENOKOT   SYSTANE BALANCE OP   UNABLE TO FIND   Vitamin B-12 1000 MCG Subl   Vitamin D3 2000 units Tabs     TAKE these medications   levETIRAcetam 500 mg in sodium chloride 0.9 % 100 mL Inject 500 mg into the vein every 12 (twelve) hours.   LORazepam 2 MG/ML injection Commonly known as:  ATIVAN Inject 0.25 mLs (0.5 mg total) into the vein every 4 (four) hours as needed for seizure.   morphine 2 MG/ML injection Inject 0.125-0.5 mLs (0.25-1 mg total) into the vein every 2 (two) hours as needed (pain).   OXYGEN Inhale 2 L into the lungs continuous.      Follow-up Information    Orpah Melter, MD Follow up.   Specialty:  Internal Medicine Why:  Follow-up at [facility] for end of life hospice care. Contact information: [place] [address] [phone]          Allergies  Allergen Reactions  . Other Other (See Comments)    PER DAUGHTER, MOST PAIN MEDICATIONS "KNOCK OUT THE PATIENT" FOR SEVERAL DAYS  PT ACCEPTS NO BLOOD PRODUCTS  . Latex Other (See Comments)    "BURNS THE SKIN"  . Lisinopril Other (See Comments)    Weakness in the legs  . Ultram [Tramadol] Other (See Comments)    VERTIGO FOR DAYS  . Codeine Nausea And Vomiting and Rash     And "zonked her out" (per daughter)      Procedures/Studies: Ct Angio Head W Or Wo Contrast  Result Date: 06/18/2017 CLINICAL DATA:  Altered mental status EXAM: CT ANGIOGRAPHY HEAD TECHNIQUE: Multidetector CT imaging of the head was performed using the standard protocol during bolus administration of intravenous contrast. Multiplanar CT image reconstructions and MIPs were obtained to evaluate the vascular anatomy. CONTRAST:  50 mL Isovue 370 COMPARISON:  Head CT 04/17/2016 FINDINGS: Anterior circulation: --Intracranial internal carotid arteries: Normal. --Anterior cerebral arteries: Normal. --Middle cerebral arteries: There is occlusion of the left middle cerebral artery M1 segment with reconstitution of the distal M1 segment. The right MCA is normal. --Posterior communicating arteries: Diminutive right. Normal caliber left. Posterior circulation: --Posterior cerebral arteries: Fetal origin of the left posterior cerebral artery. Normal conventional right PCA. --Superior cerebellar arteries: Normal. --Basilar artery: Normal. --Anterior inferior cerebellar arteries: Normal. --Posterior inferior cerebellar arteries: Right-dominant. Venous sinuses: As permitted by contrast timing, patent. Anatomic variants: Fetal origin of the left posterior cerebral artery. Delayed  phase: No parenchymal contrast enhancement. Review of the MIP images confirms the above findings IMPRESSION: 1. Occlusion of a short segment of the left middle cerebral artery, with normal opacification distally. 2. No hemorrhage or midline shift. 3. Advanced chronic ischemic microangiopathy. Critical Value/emergent results were called by telephone at the time of interpretation on 06/18/2017 at 9:22 pm to Dr. Malvin Johns, who verbally acknowledged these results. Electronically Signed   By: Ulyses Jarred M.D.   On: 06/18/2017 21:33   Ct Head Wo Contrast  Result Date: 06/18/2017 CLINICAL DATA:  Altered mental status. EXAM: CT HEAD WITHOUT CONTRAST  TECHNIQUE: Contiguous axial images were obtained from the base of the skull through the vertex without intravenous contrast. COMPARISON:  11/17/2016 FINDINGS: Brain: Moderate left occipital patchy low-density with neutral mass effect that has occurred since prior and has a subacute appearance. There is extensive chronic small vessel ischemic change with remote right basal ganglia infarction. White matter gliosis is confluent. There is asymmetric low-density in the right cerebral white matter, where there is also greater volume loss, stable from prior and consistent with asymmetric ischemic injury. Vascular: Atherosclerotic calcification.  No hyperdense vessel. Skull: Stable benign-appearing lucencies in the calvarium. Osteopenia. Sinuses/Orbits: No acute finding.  Bilateral cataract resection. IMPRESSION: 1. Moderate left occipital infarct with a subacute appearance. 2. Extensive chronic small vessel ischemia with remote perforator infarct in the right basal ganglia. Ischemic gliosis more severely affects the right cerebral white matter. Electronically Signed   By: Monte Fantasia M.D.   On: 06/18/2017 13:32   Mr Brain Wo Contrast  Result Date: 06/18/2017 CLINICAL DATA:  Unresponsive patient EXAM: MRI HEAD WITHOUT CONTRAST TECHNIQUE: Multiplanar, multiecho pulse sequences of the brain and surrounding structures were obtained without intravenous contrast. COMPARISON:  CTA head 06/18/2017 FINDINGS: Brain: The midline structures are normal. There is multifocal abnormal diffusion weighted signal at multiple old areas within the left hemisphere. A single site in the lower left frontal lobe shows corresponding reduced diffusion on the ADC map (series 3 image 37). Diffusion abnormality at the base of the left central sulcus may be a subacute infarct (series 3 image 40). There are old infarcts of the right frontal lobe, left occipital lobe and multiple basal ganglia lacunar infarcts. There is diffuse confluent  hyperintense T2-weighted signal within the periventricular white matter, most often seen in the setting of chronic microvascular ischemia. No intraparenchymal hematoma or chronic microhemorrhage. The dura is normal and there is no extra-axial collection. Vascular: Mild hyperintensity within the left MCA flow void at the site of the filling defect demonstrated on the comparison CTA. Skull and upper cervical spine: The visualized skull base, calvarium, upper cervical spine and extracranial soft tissues are normal. Sinuses/Orbits: No fluid levels or advanced mucosal thickening. No mastoid or middle ear effusion. There is fluid layering in the nasopharynx. Normal orbits. IMPRESSION: 1. Multiple diffusion-weighted abnormalities within the left hemisphere, only one of which shows corresponding reduced diffusion on the ADC map, consistent with a small focus of acute ischemia in the inferior left frontal lobe. The other areas correspond to a combination of subacute infarcts (base of left central sulcus) and T2 shine through at sites of chronic infarcts (left occipital lobe). 2. No mass effect or acute hemorrhage. 3. Severe chronic ischemic microangiopathy and multiple old infarcts. Electronically Signed   By: Ulyses Jarred M.D.   On: 06/18/2017 22:28   Dg Chest Port 1 View  Result Date: 06/20/2017 CLINICAL DATA:  Ventilator dependent EXAM: PORTABLE CHEST 1 VIEW COMPARISON:  11/21/2008 FINDINGS: Endotracheal tube and nasogastric catheter are again noted and stable. Cardiac shadow remains enlarged. Aortic calcifications are again noted with tortuosity. The lungs are well aerated with mild interstitial changes. Increasing left basilar atelectasis is noted. No sizable effusion or pneumothorax is seen. IMPRESSION: Increasing left basilar atelectasis. Electronically Signed   By: Inez Catalina M.D.   On: 06/20/2017 06:41   Dg Chest Portable 1 View  Result Date: 06/18/2017 CLINICAL DATA:  Respiratory failure, COPD, former  smoker. Mental status changes. EXAM: PORTABLE CHEST 1 VIEW COMPARISON:  Chest x-ray of April 17, 2016 FINDINGS: The lungs are mildly hypoinflated. There is no focal infiltrate. The interstitial markings are mildly prominent though stable. The cardiac silhouette is enlarged. The pulmonary vascularity is not engorged. There is calcification in the wall of the thoracic aorta. The endotracheal tube tip lies 3.3 cm above the carina. The esophagogastric tube tip and proximal port project below the inferior margin of the image. IMPRESSION: Mild hypoinflation. No acute pneumonia. Cardiomegaly without pulmonary edema. The support tubes are in reasonable position. Thoracic aortic atherosclerosis. Electronically Signed   By: David  Martinique M.D.   On: 06/18/2017 12:25   Dg Abd Portable 1v  Result Date: 06/19/2017 CLINICAL DATA:  83 year old female enteric tube placement. Left MCA infarcts. EXAM: PORTABLE ABDOMEN - 1 VIEW COMPARISON:  Lumbar radiographs 11/17/2016. FINDINGS: Portable AP supine view at 1329 hours. Enteric tube courses to the epigastrium and loops in the stomach, tip at the level of the distal gastric body. Paucity of bowel gas elsewhere. Confluent opacity with some air bronchograms at the left lung base. Stable visualized osseous structures. IMPRESSION: 1. Enteric tube looped in the distal stomach. 2. Mild medial left lower lobe atelectasis or consolidation. Electronically Signed   By: Genevie Ann M.D.   On: 06/19/2017 13:53   Ct No Charge  Result Date: 06/19/2017 CLINICAL DATA:  Altered mental status EXAM: CT ANGIOGRAPHY HEAD TECHNIQUE: Multidetector CT imaging of the head was performed using the standard protocol during bolus administration of intravenous contrast. Multiplanar CT image reconstructions and MIPs were obtained to evaluate the vascular anatomy. CONTRAST:  50 mL Isovue 370 COMPARISON:  Head CT 04/17/2016 FINDINGS: Anterior circulation: --Intracranial internal carotid arteries: Normal. --Anterior  cerebral arteries: Normal. --Middle cerebral arteries: There is occlusion of the left middle cerebral artery M1 segment with reconstitution of the distal M1 segment. The right MCA is normal. --Posterior communicating arteries: Diminutive right. Normal caliber left. Posterior circulation: --Posterior cerebral arteries: Fetal origin of the left posterior cerebral artery. Normal conventional right PCA. --Superior cerebellar arteries: Normal. --Basilar artery: Normal. --Anterior inferior cerebellar arteries: Normal. --Posterior inferior cerebellar arteries: Right-dominant. Venous sinuses: As permitted by contrast timing, patent. Anatomic variants: Fetal origin of the left posterior cerebral artery. Delayed phase: No parenchymal contrast enhancement. Review of the MIP images confirms the above findings IMPRESSION: 1. Occlusion of a short segment of the left middle cerebral artery, with normal opacification distally. 2. No hemorrhage or midline shift. 3. Advanced chronic ischemic microangiopathy. Critical Value/emergent results were called by telephone at the time of interpretation on 06/18/2017 at 9:22 pm to Dr. Malvin Johns, who verbally acknowledged these results. Electronically Signed   By: Ulyses Jarred M.D.   On: 06/18/2017 21:33      Subjective: Patient seen this morning. Unresponsive and nonverbal. No family at bedside. As per RN, no acute issues reported.  Discharge Exam:  Vitals:   06/20/17 2046 06/21/17 0207 06/21/17 0631 06/21/17 0821  BP:   126/64  Pulse:   89   Resp:   16   Temp:   98.5 F (36.9 C)   TempSrc:   Axillary   SpO2: 98% 98% 100% 97%  Weight:      Height:        General: Elderly female, moderately built and nourished, lying comfortably supine in bed. Appears in no distress. Cardiovascular: S1 & S2 heard, RRR, S1/S2 +. No murmurs, rubs, gallops or clicks. No JVD. Trace bilateral ankle edema. Respiratory: Poor inspiratory effort. Slightly diminished breath sounds in the  bases but otherwise clear to auscultation without wheezing, rhonchi or crackles. Abdominal:  Non distended, non tender & soft. No organomegaly or masses appreciated. Normal bowel sounds heard. CNS: Unresponsive to all stimuli. Extremities: Contracture of left upper extremity. Increased tone of right upper extremity. No movement of limbs noted.    The results of significant diagnostics from this hospitalization (including imaging, microbiology, ancillary and laboratory) are listed below for reference.     Microbiology: Recent Results (from the past 240 hour(s))  Urine culture     Status: Abnormal   Collection Time: 06/18/17  2:04 PM  Result Value Ref Range Status   Specimen Description URINE, CLEAN CATCH  Final   Special Requests NONE  Final   Culture MULTIPLE SPECIES PRESENT, SUGGEST RECOLLECTION (A)  Final   Report Status 06/20/2017 FINAL  Final  Culture, blood (routine x 2)     Status: None (Preliminary result)   Collection Time: 06/18/17  2:21 PM  Result Value Ref Range Status   Specimen Description BLOOD RIGHT FOREARM  Final   Special Requests   Final    BOTTLES DRAWN AEROBIC AND ANAEROBIC Blood Culture adequate volume   Culture NO GROWTH 2 DAYS  Final   Report Status PENDING  Incomplete  Culture, blood (routine x 2)     Status: None (Preliminary result)   Collection Time: 06/18/17  2:48 PM  Result Value Ref Range Status   Specimen Description BLOOD RIGHT ANTECUBITAL  Final   Special Requests   Final    BOTTLES DRAWN AEROBIC AND ANAEROBIC Blood Culture adequate volume   Culture NO GROWTH 2 DAYS  Final   Report Status PENDING  Incomplete  MRSA PCR Screening     Status: None   Collection Time: 06/20/17  7:45 AM  Result Value Ref Range Status   MRSA by PCR NEGATIVE NEGATIVE Final    Comment:        The GeneXpert MRSA Assay (FDA approved for NASAL specimens only), is one component of a comprehensive MRSA colonization surveillance program. It is not intended to diagnose  MRSA infection nor to guide or monitor treatment for MRSA infections.      Labs: CBC:  Recent Labs Lab 06/18/17 1153 06/18/17 1211 06/18/17 2321 06/20/17 0440  WBC 8.4  --  8.4 7.7  NEUTROABS 4.3  --  6.8  --   HGB 12.8 13.3 10.9* 10.3*  HCT 40.8 39.0 35.8* 34.1*  MCV 84.3  --  84.6 86.5  PLT 208  --  159 500*   Basic Metabolic Panel:  Recent Labs Lab 06/18/17 1153 06/18/17 1211 06/18/17 2321 06/19/17 0816 06/20/17 0440  NA 139 141  --  140 142  K 3.7 3.7  --  3.7 3.3*  CL 107 107  --  108 109  CO2 23  --   --  26 24  GLUCOSE 151* 149*  --  111* 90  BUN 13 13  --  14  14  CREATININE 0.81 0.70 0.79 0.82 0.73  CALCIUM 10.3  --   --  9.6 9.5  MG  --   --   --   --  2.0  PHOS  --   --   --   --  2.7   Liver Function Tests:  Recent Labs Lab 06/18/17 1153 06/19/17 0816  AST 23 22  ALT 17 18  ALKPHOS 99 88  BILITOT 0.5 0.4  PROT 7.0 6.4*  ALBUMIN 3.6 3.3*   CBG:  Recent Labs Lab 06/18/17 1134 06/18/17 1149 06/19/17 0146 06/19/17 0327  GLUCAP 129* 151* 113* 129*   Hgb A1c  Recent Labs  06/19/17 0816  HGBA1C 5.2   Lipid Profile  Recent Labs  06/20/17 0440  CHOL 185  HDL 40*  LDLCALC 119*  TRIG 128  CHOLHDL 4.6   Urinalysis    Component Value Date/Time   COLORURINE YELLOW 06/18/2017 1213   APPEARANCEUR TURBID (A) 06/18/2017 1213   APPEARANCEUR Cloudy 02/08/2014 1200   LABSPEC 1.011 06/18/2017 1213   LABSPEC 1.014 02/08/2014 1200   PHURINE 8.0 06/18/2017 1213   GLUCOSEU NEGATIVE 06/18/2017 1213   GLUCOSEU Negative 02/08/2014 1200   HGBUR NEGATIVE 06/18/2017 1213   BILIRUBINUR NEGATIVE 06/18/2017 1213   BILIRUBINUR Negative 02/08/2014 1200   KETONESUR NEGATIVE 06/18/2017 1213   PROTEINUR 100 (A) 06/18/2017 1213   UROBILINOGEN 0.2 12/21/2013 1132   NITRITE NEGATIVE 06/18/2017 1213   LEUKOCYTESUR MODERATE (A) 06/18/2017 1213   LEUKOCYTESUR 2+ 02/08/2014 1200    I discussed with the Neuro Hospitalist who confirmed current plans  for hospice.  Time coordinating discharge: Over 30 minutes  SIGNED:  Vernell Leep, MD, FACP, [place]. [facility] Pager [phone]  If 7PM-7AM, please contact night-coverage [email or web address] Password [email or web address] 06/21/2017, 11:38 AM

## 2017-06-23 LAB — CULTURE, BLOOD (ROUTINE X 2)
CULTURE: NO GROWTH
CULTURE: NO GROWTH
SPECIAL REQUESTS: ADEQUATE
Special Requests: ADEQUATE

## 2017-08-16 DEATH — deceased
